# Patient Record
Sex: Male | Born: 1955 | Race: White | Hispanic: No | Marital: Married | Smoking: Former smoker
Health system: Southern US, Community
[De-identification: ages and names within clinical notes are randomized; demographics above are authoritative.]

## PROBLEM LIST (undated history)

## (undated) DIAGNOSIS — E119 Type 2 diabetes mellitus without complications: Secondary | ICD-10-CM

## (undated) DIAGNOSIS — I739 Peripheral vascular disease, unspecified: Secondary | ICD-10-CM

## (undated) DIAGNOSIS — I4891 Unspecified atrial fibrillation: Secondary | ICD-10-CM

## (undated) DIAGNOSIS — I4729 Other ventricular tachycardia: Secondary | ICD-10-CM

## (undated) DIAGNOSIS — I255 Ischemic cardiomyopathy: Secondary | ICD-10-CM

## (undated) DIAGNOSIS — I10 Essential (primary) hypertension: Secondary | ICD-10-CM

## (undated) DIAGNOSIS — K859 Acute pancreatitis without necrosis or infection, unspecified: Secondary | ICD-10-CM

## (undated) DIAGNOSIS — I5022 Chronic systolic (congestive) heart failure: Secondary | ICD-10-CM

## (undated) DIAGNOSIS — N183 Chronic kidney disease, stage 3 unspecified: Secondary | ICD-10-CM

## (undated) DIAGNOSIS — I429 Cardiomyopathy, unspecified: Secondary | ICD-10-CM

## (undated) DIAGNOSIS — E785 Hyperlipidemia, unspecified: Secondary | ICD-10-CM

## (undated) DIAGNOSIS — I2510 Atherosclerotic heart disease of native coronary artery without angina pectoris: Secondary | ICD-10-CM

## (undated) DIAGNOSIS — I472 Ventricular tachycardia: Secondary | ICD-10-CM

## (undated) HISTORY — DX: Hyperlipidemia, unspecified: E78.5

## (undated) HISTORY — PX: CHOLECYSTECTOMY: SHX55

## (undated) HISTORY — DX: Atherosclerotic heart disease of native coronary artery without angina pectoris: I25.10

## (undated) HISTORY — PX: ICD IMPLANT: EP1208

## (undated) HISTORY — DX: Acute pancreatitis without necrosis or infection, unspecified: K85.90

## (undated) HISTORY — DX: Essential (primary) hypertension: I10

## (undated) HISTORY — DX: Type 2 diabetes mellitus without complications: E11.9

---
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
## 2006-07-09 ENCOUNTER — Emergency Department (HOSPITAL_COMMUNITY): Admission: EM | Admit: 2006-07-09 | Discharge: 2006-07-09 | Payer: Self-pay | Admitting: Emergency Medicine

## 2006-07-10 ENCOUNTER — Emergency Department (HOSPITAL_COMMUNITY): Admission: EM | Admit: 2006-07-10 | Discharge: 2006-07-10 | Payer: Self-pay | Admitting: Family Medicine

## 2006-07-11 ENCOUNTER — Emergency Department (HOSPITAL_COMMUNITY): Admission: EM | Admit: 2006-07-11 | Discharge: 2006-07-11 | Payer: Self-pay | Admitting: Emergency Medicine

## 2019-04-03 DIAGNOSIS — L309 Dermatitis, unspecified: Secondary | ICD-10-CM | POA: Diagnosis not present

## 2019-04-03 DIAGNOSIS — Z23 Encounter for immunization: Secondary | ICD-10-CM | POA: Diagnosis not present

## 2019-04-03 DIAGNOSIS — K5790 Diverticulosis of intestine, part unspecified, without perforation or abscess without bleeding: Secondary | ICD-10-CM | POA: Diagnosis not present

## 2019-04-03 DIAGNOSIS — I10 Essential (primary) hypertension: Secondary | ICD-10-CM | POA: Diagnosis not present

## 2019-04-03 DIAGNOSIS — M109 Gout, unspecified: Secondary | ICD-10-CM | POA: Diagnosis not present

## 2019-04-03 DIAGNOSIS — Z0000 Encounter for general adult medical examination without abnormal findings: Secondary | ICD-10-CM | POA: Diagnosis not present

## 2019-05-17 DIAGNOSIS — I255 Ischemic cardiomyopathy: Secondary | ICD-10-CM | POA: Diagnosis not present

## 2019-06-05 ENCOUNTER — Other Ambulatory Visit: Payer: Self-pay

## 2019-06-05 NOTE — Patient Outreach (Addendum)
  [facility]) Care Management Chronic Special Needs Program    06/05/2019  Name: Richard Flynn, DOB: 08/16/1955  MRN: [id number]   [name] is enrolled in a chronic special needs plan for Diabetes. Telephone call to client to complete initial assessment. Unable to reach. HIPAA compliant voice message left with call back phone number.   PLAN; RNCM will attempt 2nd telephone call to client within 2 weeks.   Quinn Plowman RN,BSN,CCM [facility] [phone]

## 2019-06-06 ENCOUNTER — Ambulatory Visit: Payer: Self-pay

## 2019-06-08 ENCOUNTER — Other Ambulatory Visit: Payer: Self-pay

## 2019-06-08 NOTE — Patient Outreach (Signed)
  [facility]) Care Management Chronic Special Needs Program    06/08/2019  Name: Richard Flynn, DOB: 1956-06-08  MRN: [id number]   [name] is enrolled in a chronic special needs plan for Diabetes Second telephone call to client to complete initial assessment. Unable to reach. HIPAA compliant voice message left with call back phone number.   PLAN; RNCM will attempt 3rd telephone call to client within 1 month.    Quinn Plowman RN,BSN,CCM [facility] [phone]

## 2019-06-13 NOTE — Telephone Encounter (Signed)
This encounter was created in error - please disregard.

## 2019-06-14 ENCOUNTER — Other Ambulatory Visit: Payer: Self-pay

## 2019-06-14 NOTE — Patient Outreach (Signed)
  [facility]) Care Management Chronic Special Needs Program  06/14/2019  Name: Richard Flynn DOB: Nov 23, 1955  MRN: [id number]  Mr. Hassel Tetterton is enrolled in a chronic special needs plan for Diabetes. Client has not responded to three outreach attempts.  The client's individualized care plan was developed based upon the completed health risk assessment. Plan:   . Send unsuccessful outreach letter with a copy of individualized care plan to client . Send individualized care plan to provider  Chronic care management coordinator will attempt outreach in 9 Months.   Quinn Plowman RN,BSN,CCM [facility] [phone]

## 2019-06-16 ENCOUNTER — Ambulatory Visit: Payer: Self-pay

## 2019-08-14 DIAGNOSIS — E119 Type 2 diabetes mellitus without complications: Secondary | ICD-10-CM | POA: Diagnosis not present

## 2019-08-14 DIAGNOSIS — D649 Anemia, unspecified: Secondary | ICD-10-CM | POA: Diagnosis not present

## 2019-08-14 DIAGNOSIS — E785 Hyperlipidemia, unspecified: Secondary | ICD-10-CM | POA: Diagnosis not present

## 2019-08-14 DIAGNOSIS — M109 Gout, unspecified: Secondary | ICD-10-CM | POA: Diagnosis not present

## 2019-08-14 DIAGNOSIS — I10 Essential (primary) hypertension: Secondary | ICD-10-CM | POA: Diagnosis not present

## 2019-09-22 DIAGNOSIS — I2510 Atherosclerotic heart disease of native coronary artery without angina pectoris: Secondary | ICD-10-CM | POA: Diagnosis not present

## 2019-09-22 DIAGNOSIS — I429 Cardiomyopathy, unspecified: Secondary | ICD-10-CM | POA: Diagnosis not present

## 2019-09-22 DIAGNOSIS — I509 Heart failure, unspecified: Secondary | ICD-10-CM | POA: Diagnosis not present

## 2019-09-22 DIAGNOSIS — I255 Ischemic cardiomyopathy: Secondary | ICD-10-CM | POA: Diagnosis not present

## 2019-10-11 DIAGNOSIS — I739 Peripheral vascular disease, unspecified: Secondary | ICD-10-CM | POA: Diagnosis not present

## 2019-10-26 DIAGNOSIS — I739 Peripheral vascular disease, unspecified: Secondary | ICD-10-CM | POA: Diagnosis not present

## 2019-12-26 DIAGNOSIS — E1169 Type 2 diabetes mellitus with other specified complication: Secondary | ICD-10-CM | POA: Diagnosis not present

## 2019-12-26 DIAGNOSIS — I10 Essential (primary) hypertension: Secondary | ICD-10-CM | POA: Diagnosis not present

## 2019-12-26 DIAGNOSIS — E785 Hyperlipidemia, unspecified: Secondary | ICD-10-CM | POA: Diagnosis not present

## 2019-12-26 DIAGNOSIS — M109 Gout, unspecified: Secondary | ICD-10-CM | POA: Diagnosis not present

## 2019-12-27 DIAGNOSIS — I255 Ischemic cardiomyopathy: Secondary | ICD-10-CM | POA: Diagnosis not present

## 2020-03-19 NOTE — Telephone Encounter (Signed)
This encounter was created in error - please disregard.

## 2020-03-27 DIAGNOSIS — I255 Ischemic cardiomyopathy: Secondary | ICD-10-CM | POA: Diagnosis not present

## 2020-04-15 NOTE — Progress Notes (Signed)
Referring-Richard Marveen Reeks MD Reason for referral-coronary artery disease  HPI: 64 year old male for evaluation of coronary artery disease at request of Idelle Leech MD.  Previously followed by Tomie China, MD of [facility].  Patient found to have a cardiomyopathy November 2013.  Cardiac catheterization at that time revealed an occluded right coronary artery with left to right and right to right collaterals.  Moderate disease in the distal circumflex.  Most recent echocardiogram 2017 showed ejection fraction 35 to 40%.  Patient has had previous ICD.  Patient also with peripheral vascular disease.  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  ABIs April 2021.73 on the right and 0.73 on the left.  Patient denies dyspnea, chest pain, palpitations or syncope.  Some pain in calves with prolonged ambulation.  Current Outpatient Medications  Medication Sig Dispense Refill   allopurinol (ZYLOPRIM) 100 MG tablet Take 100 mg by mouth daily.     aspirin EC 81 MG tablet Take 81 mg by mouth daily. Swallow whole.     atorvastatin (LIPITOR) 40 MG tablet Take 40 mg by mouth daily.     carvedilol (COREG) 6.25 MG tablet Take 6.25 mg by mouth 2 (two) times daily with a meal.     Cholecalciferol (VITAMIN D3) 125 MCG (5000 UT) CAPS Take 1 capsule by mouth daily.     glipiZIDE (GLUCOTROL) 5 MG tablet Take 5 mg by mouth 2 (two) times daily before a meal.     lisinopril (ZESTRIL) 40 MG tablet Take 40 mg by mouth daily.     metFORMIN (GLUCOPHAGE-XR) 500 MG 24 hr tablet Take 500 mg by mouth 2 (two) times daily.     Omega-3 Fatty Acids (FISH OIL PO) Take 1 capsule by mouth daily.     spironolactone (ALDACTONE) 25 MG tablet Take 25 mg by mouth daily.     No current facility-administered medications for this visit.    No Known Allergies   Past Medical History:  Diagnosis Date   CAD (coronary artery disease)    Diabetes mellitus without complication ([place])    Hyperlipidemia     Hypertension    Ischemic cardiomyopathy    Pancreatitis     Past Surgical History:  Procedure Laterality Date   CHOLECYSTECTOMY     ICD IMPLANT      Social History   Socioeconomic History   Marital status: Married    Spouse name: Not on file   Number of children: 0   Years of education: Not on file   Highest education level: Not on file  Occupational History   Not on file  Tobacco Use   Smoking status: Current Every Day Smoker    Last attempt to quit: 2012    Years since quitting: 9.8   Smokeless tobacco: Never Used   Tobacco comment: vape  Substance and Sexual Activity   Alcohol use: Yes    Comment: Rare   Drug use: Not Currently   Sexual activity: Yes  Other Topics Concern   Not on file  Social History Narrative   Not on file   Social Determinants of Health   Financial Resource Strain:    Difficulty of Paying Living Expenses: Not on file  Food Insecurity: No Food Insecurity   Worried About [place] in the Last Year: Never true   [place] in the Last Year: Never true  Transportation Needs: No Transportation Needs   Lack of Transportation (Medical): No   Lack of  Transportation (Non-Medical): No  Physical Activity:    Days of Exercise per Week: Not on file   Minutes of Exercise per Session: Not on file  Stress:    Feeling of Stress : Not on file  Social Connections:    Frequency of Communication with Friends and Family: Not on file   Frequency of Social Gatherings with Friends and Family: Not on file   Attends Religious Services: Not on file   Active Member of [place] or Organizations: Not on file   Attends Archivist Meetings: Not on file   Marital Status: Not on file  Intimate Partner Violence:    Fear of Current or Ex-Partner: Not on file   Emotionally Abused: Not on file   Physically Abused: Not on file   Sexually Abused: Not on file    Family History  Problem Relation Age of Onset    CAD Mother     ROS: no fevers or chills, productive cough, hemoptysis, dysphasia, odynophagia, melena, hematochezia, dysuria, hematuria, rash, seizure activity, orthopnea, PND, pedal edema. Remaining systems are negative.  Physical Exam:   Blood pressure 124/78, pulse 65, height 5\' 6"  (1.676 m), weight 175 lb 12.8 oz (79.7 kg).  General:  Well developed/well nourished in NAD Skin warm/dry Patient not depressed No peripheral clubbing Back-normal HEENT-normal/normal eyelids Neck supple/normal carotid upstroke bilaterally; no bruits; no JVD; no thyromegaly chest - CTA/ normal expansion CV - RRR/normal S1 and S2; no murmurs, rubs or gallops;  PMI nondisplaced Abdomen -NT/ND, no HSM, no mass, + bowel sounds, no bruit 1+ femoral pulses, no bruits Ext-no edema, chords; diminished distal pulses Neuro-grossly nonfocal  ECG -sinus rhythm at a rate of 65, septal infarct, inferolateral T wave inversion.  Personally reviewed  A/P  1 coronary artery disease-patient denies chest pain.  Plan to continue medical therapy with aspirin and statin.  2 ischemic cardiomyopathy-continue carvedilol.  We will continue lisinopril for now.  We discussed Entresto today.  He will research expense and if agreeable he will contact us and we will discontinue lisinopril and then begin Entresto 48 hours later.  3 prior ICD-we will ask Dr. Curt Bears to follow in [facility].  4 hypertension-blood pressure controlled.  Medication adjustments as outlined for cardiomyopathy.  5 hyperlipidemia-continue statin.  6 chronic systolic congestive heart failure-patient appears to be euvolemic.  Continue Lasix and spironolactone at present dose.  7 peripheral vascular disease-claudication not lifestyle altering.  Continue medical therapy.  We will arrange ultrasound to exclude abdominal aortic aneurysm.  Kirk Ruths, MD

## 2020-04-18 ENCOUNTER — Ambulatory Visit (INDEPENDENT_AMBULATORY_CARE_PROVIDER_SITE_OTHER): Payer: HMO | Admitting: Cardiology

## 2020-04-18 ENCOUNTER — Encounter: Payer: Self-pay | Admitting: Cardiology

## 2020-04-18 ENCOUNTER — Other Ambulatory Visit: Payer: Self-pay

## 2020-04-18 VITALS — BP 124/78 | HR 65 | Ht 66.0 in | Wt 175.8 lb

## 2020-04-18 DIAGNOSIS — I10 Essential (primary) hypertension: Secondary | ICD-10-CM

## 2020-04-18 DIAGNOSIS — I255 Ischemic cardiomyopathy: Secondary | ICD-10-CM

## 2020-04-18 DIAGNOSIS — I2510 Atherosclerotic heart disease of native coronary artery without angina pectoris: Secondary | ICD-10-CM | POA: Diagnosis not present

## 2020-04-18 DIAGNOSIS — E7800 Pure hypercholesterolemia, unspecified: Secondary | ICD-10-CM | POA: Diagnosis not present

## 2020-04-18 DIAGNOSIS — R0989 Other specified symptoms and signs involving the circulatory and respiratory systems: Secondary | ICD-10-CM

## 2020-04-18 NOTE — Patient Instructions (Signed)
Medication Instructions:   ENTRESTO  *If you need a refill on your cardiac medications before your next appointment, please call your pharmacy*  Testing/Procedures:  Your physician has requested that you have an abdominal aorta duplex. During this test, an ultrasound is used to evaluate the aorta. Allow 30 minutes for this exam. Do not eat after midnight the day before and avoid carbonated beverages IN THE [place] OFFICE-[address]   Follow-Up: At [facility], you and your health needs are our priority.  As part of our continuing mission to provide you with exceptional heart care, we have created designated Provider Care Teams.  These Care Teams include your primary Cardiologist (physician) and Advanced Practice Providers (APPs -  Physician Assistants and Nurse Practitioners) who all work together to provide you with the care you need, when you need it.  We recommend signing up for the patient portal called "MyChart".  Sign up information is provided on this After Visit Summary.  MyChart is used to connect with patients for Virtual Visits (Telemedicine).  Patients are able to view lab/test results, encounter notes, upcoming appointments, etc.  Non-urgent messages can be sent to your provider as well.   To learn more about what you can do with MyChart, go to [email or web address].    Your next appointment:   6 month(s)  The format for your next appointment:   In Person  Provider:   Kirk Ruths, MD- [place]   Other Instructions [place]

## 2020-04-22 DIAGNOSIS — E1169 Type 2 diabetes mellitus with other specified complication: Secondary | ICD-10-CM | POA: Diagnosis not present

## 2020-04-22 DIAGNOSIS — E785 Hyperlipidemia, unspecified: Secondary | ICD-10-CM | POA: Diagnosis not present

## 2020-04-22 DIAGNOSIS — Z0000 Encounter for general adult medical examination without abnormal findings: Secondary | ICD-10-CM | POA: Diagnosis not present

## 2020-04-22 DIAGNOSIS — M109 Gout, unspecified: Secondary | ICD-10-CM | POA: Diagnosis not present

## 2020-04-22 DIAGNOSIS — I10 Essential (primary) hypertension: Secondary | ICD-10-CM | POA: Diagnosis not present

## 2020-04-23 DIAGNOSIS — E1169 Type 2 diabetes mellitus with other specified complication: Secondary | ICD-10-CM | POA: Diagnosis not present

## 2020-04-23 DIAGNOSIS — D649 Anemia, unspecified: Secondary | ICD-10-CM | POA: Diagnosis not present

## 2020-04-23 DIAGNOSIS — I10 Essential (primary) hypertension: Secondary | ICD-10-CM | POA: Diagnosis not present

## 2020-04-23 DIAGNOSIS — E785 Hyperlipidemia, unspecified: Secondary | ICD-10-CM | POA: Diagnosis not present

## 2020-04-23 DIAGNOSIS — Z125 Encounter for screening for malignant neoplasm of prostate: Secondary | ICD-10-CM | POA: Diagnosis not present

## 2020-04-23 DIAGNOSIS — R5383 Other fatigue: Secondary | ICD-10-CM | POA: Diagnosis not present

## 2020-04-23 DIAGNOSIS — M109 Gout, unspecified: Secondary | ICD-10-CM | POA: Diagnosis not present

## 2020-04-30 ENCOUNTER — Telehealth: Payer: Self-pay | Admitting: *Deleted

## 2020-04-30 DIAGNOSIS — E7800 Pure hypercholesterolemia, unspecified: Secondary | ICD-10-CM

## 2020-04-30 MED ORDER — ATORVASTATIN CALCIUM 80 MG PO TABS: 80.0000 mg | ORAL_TABLET | Freq: Every day | ORAL | 3 refills | Status: DC

## 2020-04-30 NOTE — Telephone Encounter (Signed)
Spoke with pt, recent labs from PCP reviewed by dr Stanford Breed. LDL of 81 is higher than the goal of <70 for that number. Patient instructed to increase atorvastatin to 80 mg once daily. He will have lab work repeated in 3 months. New script sent to the pharmacy and Lab orders mailed to the pt

## 2020-05-06 ENCOUNTER — Ambulatory Visit (HOSPITAL_BASED_OUTPATIENT_CLINIC_OR_DEPARTMENT_OTHER): Payer: HMO

## 2020-05-08 ENCOUNTER — Other Ambulatory Visit: Payer: Self-pay

## 2020-05-08 ENCOUNTER — Ambulatory Visit (HOSPITAL_BASED_OUTPATIENT_CLINIC_OR_DEPARTMENT_OTHER)
Admission: RE | Admit: 2020-05-08 | Discharge: 2020-05-08 | Disposition: A | Discharge status: Home / Self Care | Payer: HMO | Source: Ambulatory Visit | Attending: Cardiology | Admitting: Cardiology

## 2020-05-08 DIAGNOSIS — Z87891 Personal history of nicotine dependence: Secondary | ICD-10-CM | POA: Insufficient documentation

## 2020-05-08 DIAGNOSIS — R0989 Other specified symptoms and signs involving the circulatory and respiratory systems: Secondary | ICD-10-CM | POA: Diagnosis not present

## 2020-05-09 ENCOUNTER — Ambulatory Visit: Payer: Self-pay

## 2020-05-09 ENCOUNTER — Other Ambulatory Visit: Payer: Self-pay

## 2020-05-09 NOTE — Patient Outreach (Signed)
   [facility]) Care Management Chronic Special Needs Program    05/09/2020  Name: Richard Flynn, DOB: Nov 08, 1955  MRN: [id number]   [name] is enrolled in a chronic special needs plan for Diabetes. Telephone call to client for CSNP assessment and health risk assessment completion. Unable to reach. HIPAA compliant voice message left with call back phone number.   PLAN; RNCM will attempt 2nd telephone call to client in 3 weeks.   Richard Plowman RN,BSN,CCM [facility] [phone]

## 2020-05-16 ENCOUNTER — Ambulatory Visit (INDEPENDENT_AMBULATORY_CARE_PROVIDER_SITE_OTHER): Payer: HMO | Admitting: Cardiology

## 2020-05-16 ENCOUNTER — Other Ambulatory Visit: Payer: Self-pay

## 2020-05-16 ENCOUNTER — Encounter: Payer: Self-pay | Admitting: Cardiology

## 2020-05-16 VITALS — BP 102/66 | HR 67 | Ht 66.0 in | Wt 179.0 lb

## 2020-05-16 DIAGNOSIS — I255 Ischemic cardiomyopathy: Secondary | ICD-10-CM | POA: Diagnosis not present

## 2020-05-16 LAB — CUP PACEART INCLINIC DEVICE CHECK
Brady Statistic RV Percent Paced: 1 % — CL
Date Time Interrogation Session: 20211118145006
HighPow Impedance: 42 Ohm
HighPow Impedance: 54 Ohm
Implantable Lead Implant Date: 20140523
Implantable Lead Location: 753860
Implantable Lead Model: 296
Implantable Lead Serial Number: 305696
Implantable Pulse Generator Implant Date: 20140523
Lead Channel Impedance Value: 482 Ohm
Lead Channel Pacing Threshold Amplitude: 0.5 V
Lead Channel Pacing Threshold Pulse Width: 0.4 ms
Lead Channel Sensing Intrinsic Amplitude: 25 mV
Lead Channel Setting Pacing Amplitude: 2 V
Lead Channel Setting Pacing Pulse Width: 0.4 ms
Lead Channel Setting Sensing Sensitivity: 0.6 mV
Pulse Gen Serial Number: 100436

## 2020-05-16 NOTE — Patient Instructions (Addendum)
Medication Instructions:  Your physician recommends that you continue on your current medications as directed. Please refer to the Current Medication list given to you today.  *If you need a refill on your cardiac medications before your next appointment, please call your pharmacy*   Lab Work: None ordered   Testing/Procedures: None ordered   Follow-Up: At [facility], you and your health needs are our priority.  As part of our continuing mission to provide you with exceptional heart care, we have created designated Provider Care Teams.  These Care Teams include your primary Cardiologist (physician) and Advanced Practice Providers (APPs -  Physician Assistants and Nurse Practitioners) who all work together to provide you with the care you need, when you need it.  Remote monitoring is used to monitor your Pacemaker or ICD from home. This monitoring reduces the number of office visits required to check your device to one time per year. It allows [place] to keep an eye on the functioning of your device to ensure it is working properly. You are scheduled for a device check from home on 08/15/20. You may send your transmission at any time that day. If you have a wireless device, the transmission will be sent automatically. After your physician reviews your transmission, you will receive a postcard with your next transmission date.  Your next appointment:   1 year(s)  The format for your next appointment:   In Person  Provider:   Allegra Lai, MD   Thank you for choosing [facility]!!   Trinidad Curet, RN [phone]    Other Instructions

## 2020-05-16 NOTE — Progress Notes (Signed)
Electrophysiology Office Note   Date:  05/16/2020   ID:  Richard Flynn, DOB [birth year], MRN [id number]  PCP:  Finis Bud, MD  Cardiologist:  Stanford Breed Primary Electrophysiologist:  Fayth Trefry Meredith Leeds, MD    Chief Complaint: ICD   History of Present Illness: Richard Flynn is a 64 y.o. male who is being seen today for the evaluation of ICD at the request of Stanford Breed, Denice Bors, MD. Presenting today for electrophysiology evaluation.  He has a history of coronary artery disease, diabetes, hypertension, hyperlipidemia, and ischemic cardiomyopathy.  His cardiomyopathy was discovered November 2013.  He had a cardiac cath at that time revealed an occluded right coronary with left to right and right to right collaterals.  Most recent echo in 2017 shows an ejection fraction of 30 to 35%.  He has a [place].  He also have peripheral arterial disease.  Today, he denies symptoms of palpitations, chest pain, shortness of breath, orthopnea, PND, lower extremity edema, claudication, dizziness, presyncope, syncope, bleeding, or neurologic sequela. The patient is tolerating medications without difficulties.    Past Medical History:  Diagnosis Date  . CAD (coronary artery disease)   . Diabetes mellitus without complication ([place])   . Hyperlipidemia   . Hypertension   . Ischemic cardiomyopathy   . Pancreatitis    Past Surgical History:  Procedure Laterality Date  . CHOLECYSTECTOMY    . ICD IMPLANT       Current Outpatient Medications  Medication Sig Dispense Refill  . allopurinol (ZYLOPRIM) 100 MG tablet Take 100 mg by mouth daily.    Marland Kitchen aspirin EC 81 MG tablet Take 81 mg by mouth daily. Swallow whole.    Marland Kitchen atorvastatin (LIPITOR) 80 MG tablet Take 1 tablet (80 mg total) by mouth daily. 90 tablet 3  . carvedilol (COREG) 6.25 MG tablet Take 6.25 mg by mouth 2 (two) times daily with a meal.    . Cholecalciferol (VITAMIN D3) 125 MCG (5000 UT) CAPS Take 1 capsule by mouth  daily.    Marland Kitchen glipiZIDE (GLUCOTROL) 5 MG tablet Take 5 mg by mouth 2 (two) times daily before a meal.    . lisinopril (ZESTRIL) 40 MG tablet Take 40 mg by mouth daily.    . metFORMIN (GLUCOPHAGE-XR) 500 MG 24 hr tablet Take 500 mg by mouth 2 (two) times daily.    . Omega-3 Fatty Acids (FISH OIL PO) Take 1 capsule by mouth daily.    Marland Kitchen spironolactone (ALDACTONE) 25 MG tablet Take 25 mg by mouth daily.     No current facility-administered medications for this visit.    Allergies:   Patient has no known allergies.   Social History:  The patient  reports that he has been smoking. He has never used smokeless tobacco. He reports current alcohol use. He reports previous drug use.   Family History:  The patient's family history includes CAD in his mother.    ROS:  Please see the history of present illness.   Otherwise, review of systems is positive for none.   All other systems are reviewed and negative.    PHYSICAL EXAM: VS:  BP 102/66   Pulse 67   Ht 5\' 6"  (1.676 m)   Wt 179 lb (81.2 kg)   SpO2 99%   BMI 28.89 kg/m  , BMI Body mass index is 28.89 kg/m. GEN: Well nourished, well developed, in no acute distress  HEENT: normal  Neck: no JVD, carotid bruits, or masses Cardiac: RRR; no  murmurs, rubs, or gallops,no edema  Respiratory:  clear to auscultation bilaterally, normal work of breathing GI: soft, nontender, nondistended, + BS MS: no deformity or atrophy  Skin: warm and dry, device pocket is well healed Neuro:  Strength and sensation are intact Psych: euthymic mood, full affect  EKG:  EKG is not ordered today. Personal review of the ekg ordered 04/18/20 shows sinus rhythm, 65  Device interrogation is reviewed today in detail.  See PaceArt for details.   Recent Labs: No results found for requested labs within last 8760 hours.    Lipid Panel  No results found for: CHOL, TRIG, HDL, CHOLHDL, VLDL, LDLCALC, LDLDIRECT   Wt Readings from Last 3 Encounters:  05/16/20 179 lb  (81.2 kg)  04/18/20 175 lb 12.8 oz (79.7 kg)      Other studies Reviewed: Additional studies/ records that were reviewed today include: epic notes  ASSESSMENT AND PLAN:  1.  Coronary artery disease: Currently on aspirin and statin.  No current chest pain.  2.  Ischemic cardiomyopathy: Currently on lisinopril, carvedilol, Aldactone.  Is status post [employer] device functioning appropriately.  We Richard Flynn enroll in device clinic with remote checks.  3.  Hypertension: Currently well controlled.  No changes.    Current medicines are reviewed at length with the patient today.   The patient does not have concerns regarding his medicines.  The following changes were made today:  none  Labs/ tests ordered today include:  No orders of the defined types were placed in this encounter.    Disposition:   FU with Richard Flynn 1 year  Signed, Melda Mermelstein Meredith Leeds, MD  05/16/2020 2:09 PM     [place] [place] [address] [place] [address] [phone] (office) [phone] (fax)

## 2020-05-27 ENCOUNTER — Other Ambulatory Visit: Payer: Self-pay

## 2020-05-27 NOTE — Patient Outreach (Signed)
  [facility]) Care Management Chronic Special Needs Program    05/27/2020  Name: KEMUEL BUCHMANN, DOB: 10/29/1955  MRN: [id number]   Mr. Sanford Lindblad is enrolled in a chronic special needs plan for Diabetes. Second telephone call to client for CSNP assessment follow up. Unable to reach client. HIPAA compliant voice message left with call back phone number and return call request.   PLAN:  RNCM will attempt 3rd telephone call to client in 2 weeks.   Quinn Plowman RN,BSN,CCM [facility] [phone]

## 2020-06-04 ENCOUNTER — Other Ambulatory Visit: Payer: Self-pay

## 2020-06-04 NOTE — Patient Outreach (Signed)
[facility]) Care Management Chronic Special Needs Program  06/04/2020  Name: GABOR LUSK DOB: 06-13-1956  MRN: [id number]  Mr. Chief Walkup is enrolled in a chronic special needs plan for Diabetes. Telephone call to client for CSNP assessment update. Unable to reach. HIPAA compliant voice message left with call back phone number and return call request.  Individualized care plan completed based on available date.    Goals Addressed            This Visit's Progress   . Client understands the importance of follow-up with providers by attending scheduled visits   On track    Primary care provider visit 04/03/19 Cardiology visit 05/16/20 Continue to follow up with your doctors as recommended.      . Client verbalizes knowledge of Heart Attack self management skills within 9 months   On track    Unable to reach client to discuss. RN case manager will send client education article: Lowering your risk for heart disease Keep scheduled appointments with your provider Take your medications as prescribed.  Call 911 for heart attack symptoms: Chest pain / discomfort Feeling weak, light -headed or faint Pain or discomfort in the jaw, neck or back Pain or discomfort in one or both arms or shoulders Shortness of breath     . Client will report no worsening of symptoms of Atrial Fibrillation within the next 9 months       RN case manager will send client education article: Atrial fibrillation Review [facility] calendar sent in the mail for Atrial fibrillation information and action plan Call your provider if you have a racing or irregular heartbeat that may be uncomfortable Call 911 for severe symptoms such as chest pain, fainting, shortness of breath    . Client will verbalize knowledge of self management of Hypertension as evidences by BP reading of 140/90 or less; or as defined by provider   On track    RN case manager will send client education article: High  blood pressure in adults Take your medications as prescribed. Follow up regularly with your doctor. Plan to eat low salt and heart healthy meals full of fruits, vegetables, whole grains, lean protein and limit fat and sugars Plan to check your blood pressure regularly. If you do not have  a blood pressure monitor on can be provided to you.    Marland Kitchen HEMOGLOBIN A1C < 7       Discussed diabetes self management actions:  Glucose monitoring per provider recommendation  Check feet daily  Visit provider every 3-6 months as directed  Hbg A1C level every 3-6 months.  Eye Exam yearly  Carbohydrate controlled meal planning  Taking diabetes medication as prescribed by provider  Physical activity     . Maintain timely refills of diabetic medication as prescribed within the year .       Unable to contact client to discuss. Contact your RN case manager if you have difficulty obtaining your medications.  Take your medication as prescribed.    . Obtain annual  Lipid Profile, LDL-C       Per chart review your most recent Lipid profile was 11/28/18 The goal for LDL is less than 70 mg / dl as you are at high risk for complications try to avoid saturated fats, trans-fats and eat more fiber    . Obtain Annual Eye (retinal)  Exam        Unable to determine annual eye exam Diabetes can affect your  vision.  Plan to have a dilated eye exam every year.    . Obtain Annual Foot Exam       Unable to determine annual eye exam It is important that your doctor check your feet regularly.  Diabetes can affect the nerves in your feet causing decreased feeling or numbness.  Inform your doctor if you experience these symptoms.  Diabetes foot care - Check feet daily at home (look for skin color changes, cuts, sores or cracks in the skin, swelling of feet or ankles, ingrown or fungal toenails, corn or calluses). Report these findings to your doctor - Wash feet with soap and water, dry feet well especially between  toes - Moisturize your feet but not between the toes - Always wear shoes that protect your whole feet.      . Obtain annual screen for micro albuminuria (urine) , nephropathy (kidney problems)       RN case manager will send client education article: Urine albumin test Diabetes can affect your kidneys.      . Obtain Hemoglobin A1C at least 2 times per year       Hgb A1c completed on  11/28/2018 Have your Hgb A1c checked every 6 months if you are at goal or every 3 months if you are not at goal.  Plan to eat low carbohydrate and low salt meals, Watch portion sizes and avoid sugar sweetened drinks Exercise if you are able to.  Follow your doctor recommendations.    . Visit Primary Care Provider or Endocrinologist at least 2 times per year        Primary care provider visit 04/03/19 Cardiology visit 05/16/20 Continue to follow up with your doctors as recommended.        Plan:  Send successful outreach letter with a copy of their individualized care plan, Send individual care plan to provider and Send educational material  Chronic care management coordinator will outreach in:12 months [facility] will continue to provide services for this member through 06/28/20.  The [facility] care management team will assume care 06/29/2020.     Quinn Plowman RN,BSN,CCM Chronic Care Management Coordinator [place] [facility] [phone]    .

## 2020-06-11 ENCOUNTER — Other Ambulatory Visit: Payer: Self-pay

## 2020-06-11 NOTE — Patient Outreach (Signed)
  [facility]) Care Management Chronic Special Needs Program    06/11/2020  Name: Richard Flynn, DOB: [birth year]  MRN: [id number]  [facility] will continue to provide services for this member through 06/28/20.  The [facility] care management team will assume care 06/29/2020.   Quinn Plowman RN,BSN,CCM [facility] Management [phone]

## 2020-06-14 ENCOUNTER — Encounter (HOSPITAL_COMMUNITY): Payer: Self-pay

## 2020-06-14 ENCOUNTER — Other Ambulatory Visit: Payer: Self-pay

## 2020-06-14 ENCOUNTER — Emergency Department (HOSPITAL_COMMUNITY): Payer: HMO

## 2020-06-14 ENCOUNTER — Inpatient Hospital Stay (HOSPITAL_COMMUNITY)
Admission: EM | Admit: 2020-06-14 | Discharge: 2020-06-18 | DRG: 286 | Disposition: A | Discharge status: Home / Self Care | Payer: HMO | Attending: Internal Medicine | Admitting: Internal Medicine

## 2020-06-14 ENCOUNTER — Telehealth: Payer: Self-pay | Admitting: Cardiology

## 2020-06-14 DIAGNOSIS — E785 Hyperlipidemia, unspecified: Secondary | ICD-10-CM | POA: Diagnosis present

## 2020-06-14 DIAGNOSIS — E875 Hyperkalemia: Secondary | ICD-10-CM | POA: Diagnosis present

## 2020-06-14 DIAGNOSIS — J189 Pneumonia, unspecified organism: Secondary | ICD-10-CM

## 2020-06-14 DIAGNOSIS — Z79899 Other long term (current) drug therapy: Secondary | ICD-10-CM

## 2020-06-14 DIAGNOSIS — J181 Lobar pneumonia, unspecified organism: Secondary | ICD-10-CM | POA: Diagnosis not present

## 2020-06-14 DIAGNOSIS — J90 Pleural effusion, not elsewhere classified: Secondary | ICD-10-CM | POA: Diagnosis not present

## 2020-06-14 DIAGNOSIS — I130 Hypertensive heart and chronic kidney disease with heart failure and stage 1 through stage 4 chronic kidney disease, or unspecified chronic kidney disease: Principal | ICD-10-CM | POA: Diagnosis present

## 2020-06-14 DIAGNOSIS — Z23 Encounter for immunization: Secondary | ICD-10-CM | POA: Diagnosis not present

## 2020-06-14 DIAGNOSIS — R0602 Shortness of breath: Secondary | ICD-10-CM | POA: Diagnosis not present

## 2020-06-14 DIAGNOSIS — I252 Old myocardial infarction: Secondary | ICD-10-CM | POA: Diagnosis not present

## 2020-06-14 DIAGNOSIS — Z794 Long term (current) use of insulin: Secondary | ICD-10-CM

## 2020-06-14 DIAGNOSIS — F17290 Nicotine dependence, other tobacco product, uncomplicated: Secondary | ICD-10-CM | POA: Diagnosis not present

## 2020-06-14 DIAGNOSIS — I428 Other cardiomyopathies: Secondary | ICD-10-CM | POA: Diagnosis present

## 2020-06-14 DIAGNOSIS — E1122 Type 2 diabetes mellitus with diabetic chronic kidney disease: Secondary | ICD-10-CM | POA: Diagnosis not present

## 2020-06-14 DIAGNOSIS — R911 Solitary pulmonary nodule: Secondary | ICD-10-CM | POA: Diagnosis not present

## 2020-06-14 DIAGNOSIS — I7409 Other arterial embolism and thrombosis of abdominal aorta: Secondary | ICD-10-CM | POA: Diagnosis present

## 2020-06-14 DIAGNOSIS — I2699 Other pulmonary embolism without acute cor pulmonale: Secondary | ICD-10-CM | POA: Diagnosis not present

## 2020-06-14 DIAGNOSIS — Z95810 Presence of automatic (implantable) cardiac defibrillator: Secondary | ICD-10-CM

## 2020-06-14 DIAGNOSIS — I25110 Atherosclerotic heart disease of native coronary artery with unstable angina pectoris: Secondary | ICD-10-CM | POA: Diagnosis not present

## 2020-06-14 DIAGNOSIS — R0600 Dyspnea, unspecified: Secondary | ICD-10-CM | POA: Diagnosis present

## 2020-06-14 DIAGNOSIS — R079 Chest pain, unspecified: Secondary | ICD-10-CM

## 2020-06-14 DIAGNOSIS — N179 Acute kidney failure, unspecified: Secondary | ICD-10-CM | POA: Diagnosis present

## 2020-06-14 DIAGNOSIS — Z7982 Long term (current) use of aspirin: Secondary | ICD-10-CM

## 2020-06-14 DIAGNOSIS — D6489 Other specified anemias: Secondary | ICD-10-CM | POA: Diagnosis not present

## 2020-06-14 DIAGNOSIS — Z20822 Contact with and (suspected) exposure to covid-19: Secondary | ICD-10-CM | POA: Diagnosis present

## 2020-06-14 DIAGNOSIS — I2510 Atherosclerotic heart disease of native coronary artery without angina pectoris: Secondary | ICD-10-CM | POA: Diagnosis not present

## 2020-06-14 DIAGNOSIS — I255 Ischemic cardiomyopathy: Secondary | ICD-10-CM | POA: Diagnosis present

## 2020-06-14 DIAGNOSIS — E1151 Type 2 diabetes mellitus with diabetic peripheral angiopathy without gangrene: Secondary | ICD-10-CM | POA: Diagnosis not present

## 2020-06-14 DIAGNOSIS — Z7984 Long term (current) use of oral hypoglycemic drugs: Secondary | ICD-10-CM | POA: Diagnosis not present

## 2020-06-14 DIAGNOSIS — N1831 Chronic kidney disease, stage 3a: Secondary | ICD-10-CM | POA: Diagnosis not present

## 2020-06-14 DIAGNOSIS — I5023 Acute on chronic systolic (congestive) heart failure: Secondary | ICD-10-CM

## 2020-06-14 DIAGNOSIS — I340 Nonrheumatic mitral (valve) insufficiency: Secondary | ICD-10-CM | POA: Diagnosis not present

## 2020-06-14 DIAGNOSIS — R0789 Other chest pain: Secondary | ICD-10-CM | POA: Diagnosis not present

## 2020-06-14 HISTORY — DX: Cardiomyopathy, unspecified: I42.9

## 2020-06-14 HISTORY — DX: Peripheral vascular disease, unspecified: I73.9

## 2020-06-14 HISTORY — DX: Chronic kidney disease, stage 3 unspecified: N18.30

## 2020-06-14 HISTORY — DX: Other ventricular tachycardia: I47.29

## 2020-06-14 HISTORY — DX: Ventricular tachycardia: I47.2

## 2020-06-14 HISTORY — DX: Chronic systolic (congestive) heart failure: I50.22

## 2020-06-14 LAB — CBC WITH DIFFERENTIAL/PLATELET
Abs Immature Granulocytes: 0.06 10*3/uL (ref 0.00–0.07)
Basophils Absolute: 0.1 10*3/uL (ref 0.0–0.1)
Basophils Relative: 1 %
Eosinophils Absolute: 0.1 10*3/uL (ref 0.0–0.5)
Eosinophils Relative: 1 %
HCT: 34.4 % — ABNORMAL LOW (ref 39.0–52.0)
Hemoglobin: 10.9 g/dL — ABNORMAL LOW (ref 13.0–17.0)
Immature Granulocytes: 1 %
Lymphocytes Relative: 8 %
Lymphs Abs: 1 10*3/uL (ref 0.7–4.0)
MCH: 30.1 pg (ref 26.0–34.0)
MCHC: 31.7 g/dL (ref 30.0–36.0)
MCV: 95 fL (ref 80.0–100.0)
Monocytes Absolute: 0.7 10*3/uL (ref 0.1–1.0)
Monocytes Relative: 5 %
Neutro Abs: 11.2 10*3/uL — ABNORMAL HIGH (ref 1.7–7.7)
Neutrophils Relative %: 84 %
Platelets: 177 10*3/uL (ref 150–400)
RBC: 3.62 MIL/uL — ABNORMAL LOW (ref 4.22–5.81)
RDW: 14.7 % (ref 11.5–15.5)
WBC: 13.1 10*3/uL — ABNORMAL HIGH (ref 4.0–10.5)
nRBC: 0 % (ref 0.0–0.2)

## 2020-06-14 LAB — COMPREHENSIVE METABOLIC PANEL
ALT: 37 U/L (ref 0–44)
AST: 32 U/L (ref 15–41)
Albumin: 3.7 g/dL (ref 3.5–5.0)
Alkaline Phosphatase: 73 U/L (ref 38–126)
Anion gap: 11 (ref 5–15)
BUN: 22 mg/dL (ref 8–23)
CO2: 18 mmol/L — ABNORMAL LOW (ref 22–32)
Calcium: 8.3 mg/dL — ABNORMAL LOW (ref 8.9–10.3)
Chloride: 106 mmol/L (ref 98–111)
Creatinine, Ser: 1.39 mg/dL — ABNORMAL HIGH (ref 0.61–1.24)
GFR, Estimated: 56 mL/min — ABNORMAL LOW (ref >60)
Glucose, Bld: 187 mg/dL — ABNORMAL HIGH (ref 70–99)
Potassium: 5.3 mmol/L — ABNORMAL HIGH (ref 3.5–5.1)
Sodium: 135 mmol/L (ref 135–145)
Total Bilirubin: 1.4 mg/dL — ABNORMAL HIGH (ref 0.3–1.2)
Total Protein: 6.9 g/dL (ref 6.5–8.1)

## 2020-06-14 LAB — TROPONIN I (HIGH SENSITIVITY)
Troponin I (High Sensitivity): 27 ng/L — ABNORMAL HIGH (ref <18)
Troponin I (High Sensitivity): 28 ng/L — ABNORMAL HIGH (ref <18)

## 2020-06-14 LAB — RESP PANEL BY RT-PCR (FLU A&B, COVID) ARPGX2
Influenza A by PCR: NEGATIVE
Influenza B by PCR: NEGATIVE
SARS Coronavirus 2 by RT PCR: NEGATIVE

## 2020-06-14 LAB — POC OCCULT BLOOD, ED: Fecal Occult Bld: NEGATIVE

## 2020-06-14 LAB — BRAIN NATRIURETIC PEPTIDE: B Natriuretic Peptide: 603 pg/mL — ABNORMAL HIGH (ref 0.0–100.0)

## 2020-06-14 LAB — CBG MONITORING, ED: Glucose-Capillary: 119 mg/dL — ABNORMAL HIGH (ref 70–99)

## 2020-06-14 MED ORDER — OMEGA-3-ACID ETHYL ESTERS 1 G PO CAPS
1.0000 g | ORAL_CAPSULE | Freq: Every day | ORAL | Status: DC
  Administered 2020-06-15 – 2020-06-18 (×4): 1 g via ORAL
  Filled 2020-06-14 (×4): qty 1

## 2020-06-14 MED ORDER — ASPIRIN EC 81 MG PO TBEC
81.0000 mg | DELAYED_RELEASE_TABLET | Freq: Every day | ORAL | Status: DC
  Administered 2020-06-15 – 2020-06-18 (×3): 81 mg via ORAL
  Filled 2020-06-14 (×3): qty 1

## 2020-06-14 MED ORDER — ACETAMINOPHEN 650 MG RE SUPP: 650.0000 mg | Freq: Four times a day (QID) | RECTAL | Status: DC | PRN

## 2020-06-14 MED ORDER — AZITHROMYCIN 500 MG IV SOLR
500.0000 mg | Freq: Once | INTRAVENOUS | Status: AC
  Administered 2020-06-14: 500 mg via INTRAVENOUS
  Filled 2020-06-14: qty 500

## 2020-06-14 MED ORDER — INSULIN GLARGINE 100 UNIT/ML SOLOSTAR PEN: 24.0000 [IU] | PEN_INJECTOR | Freq: Every day | SUBCUTANEOUS | Status: DC

## 2020-06-14 MED ORDER — ENOXAPARIN SODIUM 40 MG/0.4ML ~~LOC~~ SOLN
40.0000 mg | SUBCUTANEOUS | Status: DC
  Administered 2020-06-14 – 2020-06-16 (×3): 40 mg via SUBCUTANEOUS
  Filled 2020-06-14 (×3): qty 0.4

## 2020-06-14 MED ORDER — LOSARTAN POTASSIUM 50 MG PO TABS
50.0000 mg | ORAL_TABLET | Freq: Every day | ORAL | Status: DC
  Administered 2020-06-15: 50 mg via ORAL
  Filled 2020-06-14: qty 1

## 2020-06-14 MED ORDER — ACETAMINOPHEN 325 MG PO TABS: 650.0000 mg | ORAL_TABLET | Freq: Four times a day (QID) | ORAL | Status: DC | PRN

## 2020-06-14 MED ORDER — CARVEDILOL 6.25 MG PO TABS
6.2500 mg | ORAL_TABLET | Freq: Two times a day (BID) | ORAL | Status: DC
  Administered 2020-06-14 – 2020-06-18 (×8): 6.25 mg via ORAL
  Filled 2020-06-14: qty 1
  Filled 2020-06-14: qty 2
  Filled 2020-06-14 (×6): qty 1

## 2020-06-14 MED ORDER — INSULIN ASPART 100 UNIT/ML ~~LOC~~ SOLN
0.0000 [IU] | Freq: Three times a day (TID) | SUBCUTANEOUS | Status: DC
  Administered 2020-06-15 – 2020-06-16 (×2): 5 [IU] via SUBCUTANEOUS
  Administered 2020-06-17 – 2020-06-18 (×2): 1 [IU] via SUBCUTANEOUS

## 2020-06-14 MED ORDER — COENZYME Q10 50 MG PO CHEW: 1.0000 | CHEWABLE_TABLET | Freq: Every day | ORAL | Status: DC

## 2020-06-14 MED ORDER — INSULIN GLARGINE 100 UNIT/ML ~~LOC~~ SOLN
24.0000 [IU] | Freq: Every day | SUBCUTANEOUS | Status: DC
  Administered 2020-06-14 – 2020-06-17 (×4): 24 [IU] via SUBCUTANEOUS
  Filled 2020-06-14 (×5): qty 0.24

## 2020-06-14 MED ORDER — ONDANSETRON HCL 4 MG/2ML IJ SOLN: 4.0000 mg | Freq: Four times a day (QID) | INTRAMUSCULAR | Status: DC | PRN

## 2020-06-14 MED ORDER — SODIUM CHLORIDE 0.9 % IV SOLN
1.0000 g | Freq: Once | INTRAVENOUS | Status: AC
  Administered 2020-06-14: 1 g via INTRAVENOUS
  Filled 2020-06-14: qty 10

## 2020-06-14 MED ORDER — ATORVASTATIN CALCIUM 80 MG PO TABS
80.0000 mg | ORAL_TABLET | Freq: Every day | ORAL | Status: DC
  Administered 2020-06-15 – 2020-06-18 (×4): 80 mg via ORAL
  Filled 2020-06-14 (×4): qty 1

## 2020-06-14 MED ORDER — ONDANSETRON HCL 4 MG PO TABS: 4.0000 mg | ORAL_TABLET | Freq: Four times a day (QID) | ORAL | Status: DC | PRN

## 2020-06-14 MED ORDER — ADULT MULTIVITAMIN W/MINERALS CH
1.0000 | ORAL_TABLET | Freq: Every day | ORAL | Status: DC
  Administered 2020-06-14 – 2020-06-17 (×4): 1 via ORAL
  Filled 2020-06-14 (×4): qty 1

## 2020-06-14 MED ORDER — FUROSEMIDE 10 MG/ML IJ SOLN
40.0000 mg | Freq: Two times a day (BID) | INTRAMUSCULAR | Status: DC
  Administered 2020-06-14 – 2020-06-15 (×2): 40 mg via INTRAVENOUS
  Filled 2020-06-14 (×2): qty 4

## 2020-06-14 MED ORDER — ALLOPURINOL 100 MG PO TABS
100.0000 mg | ORAL_TABLET | Freq: Every day | ORAL | Status: DC
  Administered 2020-06-15 – 2020-06-18 (×4): 100 mg via ORAL
  Filled 2020-06-14 (×4): qty 1

## 2020-06-14 MED ORDER — VITAMIN D 25 MCG (1000 UNIT) PO TABS
5000.0000 [IU] | ORAL_TABLET | Freq: Every day | ORAL | Status: DC
  Administered 2020-06-15 – 2020-06-18 (×4): 5000 [IU] via ORAL
  Filled 2020-06-14 (×4): qty 5

## 2020-06-14 NOTE — ED Provider Notes (Signed)
[facility] EMERGENCY DEPARTMENT Provider Note   CSN: [id number] Arrival date & time: 06/14/20  1532     History Chief Complaint  Patient presents with  . Chest Pain  . Shortness of Breath    Richard Flynn is a 64 y.o. male.  HPI   Pt is as 64 y/o male with a h/o CAD, DM, HLD, HTN, ischemic cardiomyopathy, pancreatitis, MIx2, who presents to the ED today for eval of shortness of breath that started a few weeks ago. States sxs intermittent and were worse today. sxs initially seemed to only occur with exertion but now he is short of breath even at rest. States that he has some associated chest pain when he gets short of breath. Chest pain located to the mid chest and is nonradiating. Describes pain as a heaviness that is mild. Rates it at 2-3/10 when he has it, but he currently has no pain. Pain is not pleuritic. Reports associated diaphoresis, but denies nausea. Denies recent leg swelling  Has had a cough for several weeks. This has been productive with clear/yellow sputum.  Denies leg pain/swelling, hemoptysis, recent surgery/trauma, hormone use, personal hx of cancer, or hx of DVT/PE.  Reports recent long car ride to and from [place], [place] (total of 12 hours each way). But he states sxs started prior to this trip.    Past Medical History:  Diagnosis Date  . CAD (coronary artery disease)    a. [facility] notes suggest cardiac cath 2013 showed occluded proximal RCA with left to right collaterals, 20-40% proximal LAD, 70% distal circumflex artery.  . Cardiomyopathy ([place])   . Chronic systolic CHF (congestive heart failure) ([place])   . CKD (chronic kidney disease), stage III ([place])   . Diabetes mellitus without complication ([place])   . Hyperlipidemia   . Hypertension   . NSVT (nonsustained ventricular tachycardia) ([place])    mentioned in device interrogation  . PAD (peripheral artery disease) ([place])     with aortoiliac occlusion (med rx per [facility])  .  Pancreatitis     There are no problems to display for this patient.   Past Surgical History:  Procedure Laterality Date  . CHOLECYSTECTOMY    . ICD IMPLANT         Family History  Problem Relation Age of Onset  . CAD Mother     Social History   Tobacco Use  . Smoking status: Current Every Day Smoker    Last attempt to quit: 2012    Years since quitting: 9.9  . Smokeless tobacco: Never Used  . Tobacco comment: vape  Substance Use Topics  . Alcohol use: Not Currently    Comment: Rare  . Drug use: Not Currently    Home Medications Prior to Admission medications   Medication Sig Start Date End Date Taking? Authorizing Provider  allopurinol (ZYLOPRIM) 100 MG tablet Take 100 mg by mouth daily.   Yes [provider]  aspirin EC 81 MG tablet Take 81 mg by mouth daily. Swallow whole.   Yes [provider]  atorvastatin (LIPITOR) 80 MG tablet Take 1 tablet (80 mg total) by mouth daily. 04/30/20  Yes Lelon Perla, MD  carvedilol (COREG) 6.25 MG tablet Take 6.25 mg by mouth 2 (two) times daily with a meal.   Yes [provider]  Cholecalciferol (VITAMIN D3) 125 MCG (5000 UT) CAPS Take 1 capsule by mouth daily.   Yes [provider]  Coenzyme Q10 (COQ10 GUMMIES ADULT)  50 MG CHEW Chew 1 tablet by mouth at bedtime.   Yes [provider]  glipiZIDE (GLUCOTROL) 5 MG tablet Take 5 mg by mouth 2 (two) times daily before a meal.   Yes [provider]  LANTUS SOLOSTAR 100 UNIT/ML Solostar Pen Inject 24 Units into the skin at bedtime. 06/13/20  Yes [provider]  lisinopril (ZESTRIL) 40 MG tablet Take 40 mg by mouth daily.   Yes [provider]  metFORMIN (GLUCOPHAGE-XR) 500 MG 24 hr tablet Take 500 mg by mouth 2 (two) times daily.   Yes [provider]  Misc Natural Products (AIRBORNE ELDERBERRY) CHEW Chew 1 tablet by mouth at bedtime.   Yes [provider]  Multiple Vitamins-Minerals  (MULTIVITAMIN ADULT) CHEW Chew 1 tablet by mouth at bedtime.   Yes [provider]  nitroGLYCERIN (NITROSTAT) 0.4 MG SL tablet Place under the tongue. 06/14/20  Yes [provider]  Omega-3 Fatty Acids (FISH OIL PO) Take 1 capsule by mouth daily.   Yes [provider]  spironolactone (ALDACTONE) 25 MG tablet Take 25 mg by mouth daily.   Yes [provider]    Allergies    Patient has no known allergies.  Review of Systems   Review of Systems  Constitutional: Positive for diaphoresis. Negative for chills and fever.  HENT: Negative for ear pain and sore throat.   Eyes: Negative for pain and visual disturbance.  Respiratory: Positive for cough and shortness of breath.   Cardiovascular: Positive for chest pain. Negative for leg swelling.  Gastrointestinal: Negative for abdominal pain, constipation, diarrhea, nausea and vomiting.  Genitourinary: Negative for dysuria and hematuria.  Musculoskeletal: Negative for back pain.  Skin: Negative for rash.  Neurological: Negative for seizures and syncope.  All other systems reviewed and are negative.   Physical Exam Updated Vital Signs BP (!) 136/91   Pulse 71   Temp 98 F (36.7 C) (Oral)   Resp 18   Ht 5\' 6"  (1.676 m)   Wt 79.4 kg   SpO2 92%   BMI 28.25 kg/m   Physical Exam Vitals and nursing note reviewed.  Constitutional:      Appearance: He is well-developed and well-nourished.  HENT:     Head: Normocephalic and atraumatic.  Eyes:     Conjunctiva/sclera: Conjunctivae normal.  Cardiovascular:     Rate and Rhythm: Normal rate and regular rhythm.     Heart sounds: No murmur heard.   Pulmonary:     Effort: Pulmonary effort is normal. No respiratory distress.     Breath sounds: Examination of the right-lower field reveals rales. Examination of the left-lower field reveals rales. Rales present. No decreased breath sounds, wheezing or rhonchi.  Abdominal:     Palpations: Abdomen is soft.      Tenderness: There is no abdominal tenderness. There is no guarding or rebound.  Musculoskeletal:        General: No edema.     Cervical back: Neck supple.     Right lower leg: No tenderness. No edema.     Left lower leg: No tenderness. No edema.  Skin:    General: Skin is warm and dry.  Neurological:     Mental Status: He is alert.  Psychiatric:        Mood and Affect: Mood and affect normal.     ED Results / Procedures / Treatments   Labs (all labs ordered are listed, but only abnormal results are displayed) Labs Reviewed  CBC WITH  DIFFERENTIAL/PLATELET - Abnormal; Notable for the following components:      Result Value   WBC 13.1 (*)    RBC 3.62 (*)    Hemoglobin 10.9 (*)    HCT 34.4 (*)    Neutro Abs 11.2 (*)    All other components within normal limits  COMPREHENSIVE METABOLIC PANEL - Abnormal; Notable for the following components:   Potassium 5.3 (*)    CO2 18 (*)    Glucose, Bld 187 (*)    Creatinine, Ser 1.39 (*)    Calcium 8.3 (*)    Total Bilirubin 1.4 (*)    GFR, Estimated 56 (*)    All other components within normal limits  BRAIN NATRIURETIC PEPTIDE - Abnormal; Notable for the following components:   B Natriuretic Peptide 603.0 (*)    All other components within normal limits  TROPONIN I (HIGH SENSITIVITY) - Abnormal; Notable for the following components:   Troponin I (High Sensitivity) 27 (*)    All other components within normal limits  TROPONIN I (HIGH SENSITIVITY) - Abnormal; Notable for the following components:   Troponin I (High Sensitivity) 28 (*)    All other components within normal limits  RESP PANEL BY RT-PCR (FLU A&B, COVID) ARPGX2  POC OCCULT BLOOD, ED    EKG EKG Interpretation  Date/Time:  Friday June 14 2020 16:22:19 EST Ventricular Rate:  66 PR Interval:  182 QRS Duration: 100 QT Interval:  414 QTC Calculation: 434 R Axis:   15 Text Interpretation: Normal sinus rhythm T wave abnormality, consider lateral ischemia Abnormal ECG  No old tracing to compare Confirmed by Aletta Edouard [phone]) on 06/14/2020 4:35:27 PM   Radiology DG Chest 1 View  Result Date: 06/14/2020 CLINICAL DATA:  Chest pain, chest tightness with shortness of breath. EXAM: CHEST  1 VIEW.  AP portable semi erect. COMPARISON:  None. FINDINGS: Single lead left chest wall cardiac defibrillator. The heart size and mediastinal contours are within normal limits. Aortic arch calcification. Streaky left mid lung airspace opacity. No pulmonary edema. Trace left pleural effusion. No pneumothorax. No acute osseous abnormality. Multilevel degenerative changes of the spine. IMPRESSION: Streaky left mid lung airspace opacity and trace left pleural effusion suggestive of infection/inflammation. Followup PA and lateral chest X-ray is recommended in 3-4 weeks to ensure resolution and exclude underlying malignancy. Electronically Signed   By: Iven Finn M.D.   On: 06/14/2020 16:56    Procedures Procedures (including critical care time)  Medications Ordered in ED Medications  azithromycin (ZITHROMAX) 500 mg in sodium chloride 0.9 % 250 mL IVPB (500 mg Intravenous New Bag/Given 06/14/20 2019)  furosemide (LASIX) injection 40 mg (has no administration in time range)  cefTRIAXone (ROCEPHIN) 1 g in sodium chloride 0.9 % 100 mL IVPB (0 g Intravenous Stopped 06/14/20 1922)    ED Course  I have reviewed the triage vital signs and the nursing notes.  Pertinent labs & imaging results that were available during my care of the patient were reviewed by me and considered in my medical decision making (see chart for details).  Clinical Course as of 06/14/20 2023  Fri Jun 14, 5937  1338 64 year old male with known coronary disease and decreased ejection fraction with AICD here with a few weeks of dyspnea on exertion and some chest pressure.  Symptoms occurred at rest today so he decided to come in and get evaluated.  Currently not having any chest pain.  Satting 100% on room  air.  EKG is not a STEMI.  Getting labs including troponin and BNP, CXR.  [MB]    Clinical Course User Index [MB] Hayden Rasmussen, MD   MDM Rules/Calculators/A&P                          64 year old male presenting to the department today for evaluation of exertional shortness of breath, chest pain for the last several weeks.  Has also had a mild cough.  Significant cardiac history.  Reviewed/interpreted labs CBC with leukocytosis at 13, hemoglobin 10.9 down from 12 noted in October  -No melena/hematochezia, Hemoccult negative CMP with elevated potassium of 5.3, low bicarb at 18, elevated creatinine 1.39, elevated bilirubin at 1.4, or liver enzymes normal Trops are marginally elevated but flat BNP is elevated at 600 Covid test negative  EKG Normal sinus rhythm T wave abnormality, consider lateral ischemia Abnormal ECG   - unchanged from prior  cxr reviewed/interpreted -   Streaky left mid lung airspace opacity and trace left pleural effusion suggestive of infection/inflammation. Followup PA and lateral chest X-ray is recommended in 3-4 weeks to ensure resolution and exclude underlying malignancy.  Patient with exertional shortness of breath/chest pain.  Does have a pneumonia on chest x-ray but given his cardiac history there is also concern for unstable angina.  His tropes are mildly elevated today, EKG is unchanged.  Will touch base with cardiology.  6:00 PM CONSULT with Melina Copa, PA-C.  She recommends hemoccult given new anemia. Cards will see pt to determine if heparin is needed. Recommend hospitalist admission.  7:39 PM CONSULT with Dr. Laural Golden who accepts patient for admission   Final Clinical Impression(s) / ED Diagnoses Final diagnoses:  Community acquired pneumonia, unspecified laterality  Chest pain, unspecified type    Rx / DC Orders ED Discharge Orders    None       Bishop Dublin 06/14/20 2024    Hayden Rasmussen, MD 06/14/20 2318

## 2020-06-14 NOTE — Telephone Encounter (Signed)
Pt c/o Shortness Of Breath: STAT if SOB developed within the last 24 hours or pt is noticeably SOB on the phone  1. Are you currently SOB (can you hear that pt is SOB on the phone)? no  2. How long have you been experiencing SOB? 1 week or 2  3. Are you SOB when sitting or when up moving around? On exertion   4. Are you currently experiencing any other symptoms? No   Patient states he has been having SOB on exertion for about a week or two.

## 2020-06-14 NOTE — ED Triage Notes (Signed)
Pt reports 2 episodes of chest tightness along with shortness of breath prior to arriving to ER. After arrival to ER with EMS pt stated he was experiencing the chest tightness and shortness of breath. Pt palced on 2L [place].  Symptoms resolved within 1 min. Pt oxygen noted to be 100% on 2L [place]. EMS states pt was symptom free and 99% on RA prior to arrival. Pt A&O and noted to be clammy. 20G L hand placed by EMS.

## 2020-06-14 NOTE — Consult Note (Addendum)
Cardiology Consultation:   Patient ID: Richard Flynn MRN: [id number]; DOB: [birth year]  Admit date: 06/14/2020 Date of Consult: 06/14/2020  Primary Care Provider: Finis Bud, MD [facility] Cardiologist: Kirk Ruths, MD  [facility] Electrophysiologist:  Will Meredith Leeds, MD    Patient Profile:   Richard Flynn is a 64 y.o. male with a hx of longstanding cardiomyopathy diagnosed in 2013 (last EF 35-40% in 4098), chronic systolic CHF, [employer] ICD implanted 2014, CAD with occluded RCA with collaterals collaterals by cath , DM, HTN, HLD, CKD stage III, pancreatitis, PAD with aortoiliac occlusion (med rx per [facility]), NSVT noted in device interrogation who is being seen today for the evaluation of exertional SOB at the request of Cortni Coutoure PA-C.  History of Present Illness:   He was previously followed by [facility] before establishing care with Dr. Stanford Breed and Dr. Curt Bears earlier this fall. Notes outline diagnosis of cardiomyopathy in 2013 at [facility] following recent treatment for PNA with EF 25% at that time. [facility] notes suggest cardiac cath showed occluded proximal RCA with left to right collaterals, 20-40% proximal LAD, 70% distal circumflex artery, but nothing to explain the severity of his cardiomyopathy. ICD placed 2014. Last EF referenced at 35-40% in 2017, report not available.  He presented to the ED with worsening SOB associated with some chest discomfort. For several weeks he's noticed while exerting himself he has had decreased exercise tolerance. Today however he developed episode of dyspnea at rest while in a recliner prompting him to come to the ED. Labs reveal new leukocytosis of 13.1 with elevated neutrophils, anemia wigh Hgb 10.9, hyperkalemia of 5.3, Cr 1.39 (similar to prior in 03/2020), hsTroponin 27-28 (repeat pending). CXR showed streaky left mid lung airspace opacity and trace left pleural effusion suggestive of  infection/inflammation, f/u recommended 3-4 weeks to ensure resolution and exclude underlying malignancy. Covid swab pending and hemoccult pending. EKG showed NSR 66bpm nonspecific ST/TW changes with TWI inferiorly and V5-V6, no change from 03/2020. Covid swab is pending. No known sick contacts, fever, chills, nausea, joint aches. Denies cough but some coughing noted during the interview. He denies any recent swelling or weight changes. He denies overt orthopnea but states he has to "watch the way he lays" sometimes.   Past Medical History:  Diagnosis Date  . CAD (coronary artery disease)    a. [facility] notes suggest cardiac cath 2013 showed occluded proximal RCA with left to right collaterals, 20-40% proximal LAD, 70% distal circumflex artery.  . Cardiomyopathy ([place])   . Chronic systolic CHF (congestive heart failure) ([place])   . CKD (chronic kidney disease), stage III ([place])   . Diabetes mellitus without complication ([place])   . Hyperlipidemia   . Hypertension   . NSVT (nonsustained ventricular tachycardia) ([place])    mentioned in device interrogation  . PAD (peripheral artery disease) ([place])     with aortoiliac occlusion (med rx per [facility])  . Pancreatitis     Past Surgical History:  Procedure Laterality Date  . CHOLECYSTECTOMY    . ICD IMPLANT       Home Medications:  Prior to Admission medications   Medication Sig Start Date End Date Taking? Authorizing Provider  allopurinol (ZYLOPRIM) 100 MG tablet Take 100 mg by mouth daily.   Yes [provider]  aspirin EC 81 MG tablet Take 81 mg by mouth daily. Swallow whole.   Yes [provider]  atorvastatin (LIPITOR) 80 MG tablet Take 1 tablet (  80 mg total) by mouth daily. 04/30/20  Yes Lelon Perla, MD  carvedilol (COREG) 6.25 MG tablet Take 6.25 mg by mouth 2 (two) times daily with a meal.   Yes [provider]  Cholecalciferol (VITAMIN D3) 125 MCG (5000 UT) CAPS Take 1 capsule by mouth daily.   Yes  [provider]  Coenzyme Q10 (COQ10 GUMMIES ADULT) 50 MG CHEW Chew 1 tablet by mouth at bedtime.   Yes [provider]  glipiZIDE (GLUCOTROL) 5 MG tablet Take 5 mg by mouth 2 (two) times daily before a meal.   Yes [provider]  LANTUS SOLOSTAR 100 UNIT/ML Solostar Pen Inject 24 Units into the skin at bedtime. 06/13/20  Yes [provider]  lisinopril (ZESTRIL) 40 MG tablet Take 40 mg by mouth daily.   Yes [provider]  metFORMIN (GLUCOPHAGE-XR) 500 MG 24 hr tablet Take 500 mg by mouth 2 (two) times daily.   Yes [provider]  Misc Natural Products (AIRBORNE ELDERBERRY) CHEW Chew 1 tablet by mouth at bedtime.   Yes [provider]  Multiple Vitamins-Minerals (MULTIVITAMIN ADULT) CHEW Chew 1 tablet by mouth at bedtime.   Yes [provider]  nitroGLYCERIN (NITROSTAT) 0.4 MG SL tablet Place under the tongue. 06/14/20  Yes [provider]  Omega-3 Fatty Acids (FISH OIL PO) Take 1 capsule by mouth daily.   Yes [provider]  spironolactone (ALDACTONE) 25 MG tablet Take 25 mg by mouth daily.   Yes [provider]    Inpatient Medications: Scheduled Meds: . furosemide  40 mg Intravenous BID   Continuous Infusions: . azithromycin     PRN Meds:   Allergies:   No Known Allergies  Social History:   Social History   Socioeconomic History  . Marital status: Married    Spouse name: Not on file  . Number of children: 0  . Years of education: Not on file  . Highest education level: Not on file  Occupational History  . Not on file  Tobacco Use  . Smoking status: Current Every Day Smoker    Last attempt to quit: 2012    Years since quitting: 9.9  . Smokeless tobacco: Never Used  . Tobacco comment: vape  Substance and Sexual Activity  . Alcohol use: Not Currently    Comment: Rare  . Drug use: Not Currently  . Sexual activity: Yes  Other Topics Concern  . Not on file  Social  History Narrative  . Not on file   Social Determinants of Health   Financial Resource Strain: Not on file  Food Insecurity: Not on file  Transportation Needs: Not on file  Physical Activity: Not on file  Stress: Not on file  Social Connections: Not on file  Intimate Partner Violence: Not on file    Family History:   Family History  Problem Relation Age of Onset  . CAD Mother      ROS:  Please see the history of present illness.  All other ROS reviewed and negative.     Physical Exam/Data:   Vitals:   06/14/20 1715 06/14/20 1800 06/14/20 1830 06/14/20 1915  BP: 113/78 129/82 (!) 134/92 (!) 136/91  Pulse: 68 70 74 71  Resp: 18 18 18    Temp: 98 F (36.7 C)     TempSrc: Oral     SpO2: 96% 95% 94% 92%  Weight:      Height:        Intake/Output Summary (Last 24  hours) at 06/14/2020 1938 Last data filed at 06/14/2020 1922 Gross per 24 hour  Intake 100 ml  Output --  Net 100 ml   Last 3 Weights 06/14/2020 05/16/2020 04/18/2020  Weight (lbs) 175 lb 179 lb 175 lb 12.8 oz  Weight (kg) 79.379 kg 81.194 kg 79.742 kg     Body mass index is 28.25 kg/m.  General: Well developed, well nourished WM, in no acute distress. Head: Normocephalic, atraumatic, sclera non-icteric, no xanthomas, nares are without discharge. Neck: Negative for carotid bruits. JVP not elevated. Lungs: Minimal crackles at bases. Breathing is unlabored. Heart: RRR S1 S2 without murmurs, rubs, or gallops.  Abdomen: Soft, non-tender, non-distended with normoactive bowel sounds. No rebound/guarding. Extremities: No clubbing or cyanosis. No edema. Distal pedal pulses are 2+ and equal bilaterally. Neuro: Alert and oriented X 3. Moves all extremities spontaneously. Psych:  Responds to questions appropriately with a normal affect.  EKG:  The EKG was personally reviewed and demonstrates: EKG showed NSR 66bpm nonspecific ST/TW changes with TWI inferiorly and V5-V6, no change from 03/2020 Telemetry:  Telemetry  was personally reviewed and demonstrates:  NSR  Relevant CV Studies: N/A  Laboratory Data:  High Sensitivity Troponin:   Recent Labs  Lab 06/14/20 1547 06/14/20 1823  TROPONINIHS 27* 28*     Chemistry Recent Labs  Lab 06/14/20 1547  NA 135  K 5.3*  CL 106  CO2 18*  GLUCOSE 187*  BUN 22  CREATININE 1.39*  CALCIUM 8.3*  GFRNONAA 56*  ANIONGAP 11    Recent Labs  Lab 06/14/20 1547  PROT 6.9  ALBUMIN 3.7  AST 32  ALT 37  ALKPHOS 73  BILITOT 1.4*   Hematology Recent Labs  Lab 06/14/20 1547  WBC 13.1*  RBC 3.62*  HGB 10.9*  HCT 34.4*  MCV 95.0  MCH 30.1  MCHC 31.7  RDW 14.7  PLT 177   BNP Recent Labs  Lab 06/14/20 1547  BNP 603.0*    DDimer No results for input(s): DDIMER in the last 168 hours.   Radiology/Studies:  DG Chest 1 View  Result Date: 06/14/2020 CLINICAL DATA:  Chest pain, chest tightness with shortness of breath. EXAM: CHEST  1 VIEW.  AP portable semi erect. COMPARISON:  None. FINDINGS: Single lead left chest wall cardiac defibrillator. The heart size and mediastinal contours are within normal limits. Aortic arch calcification. Streaky left mid lung airspace opacity. No pulmonary edema. Trace left pleural effusion. No pneumothorax. No acute osseous abnormality. Multilevel degenerative changes of the spine. IMPRESSION: Streaky left mid lung airspace opacity and trace left pleural effusion suggestive of infection/inflammation. Followup PA and lateral chest X-ray is recommended in 3-4 weeks to ensure resolution and exclude underlying malignancy. Electronically Signed   By: Iven Finn M.D.   On: 06/14/2020 16:56     Assessment and Plan:   1. Shortness of breath (also some chest discomfort) - symptoms smoldering for several weeks, worse with exertion, but today notable on rest - > currently asymptomatic (only received abx so far) - some question of concomitant infection with CXR findings and new leukocytosis -> started on empiric rx for  PNA, Covid swab pending - per d/w MD, start IV Lasix 40mg  BID and follow symptoms with above measures - order updated echo  - if symptoms persists despite above measures, may need to consider updated ischemic eval - not tachycardic, tachypneic or hypoxic so PE seems less likely  2. Chronic systolic CHF with possible acute exacerbation - manage in context of  the above  3. CAD with mildly elevated troponin - troponins low/flat, not felt to represent ACS - d/w MD, hold off heparin given active sx presently - continue ASA, BB, statin  4. Essential HTN - SBP 113-149 in the ED - follow with above measures  5. CKD stage IIIa with hyperkalemia - given K 5.3, recommend hold spironolactone - Cr seems at baseline, follow with diuresis - per Dr. Marlou Porch, recommend changing lisinopril to losartan 50mg  daily in anticipation of potentially transitioning to [facility] if K / Cr OK  6. Anemia - unclear cause, ? related to CKD - further eval per IM - hemoccult pending  7. IDDM - would hold metformin inpatient in case procedures needed - consider addition of SLGT-2i given CAD, CHF      TIMI Risk Score for Unstable Angina or Non-ST Elevation MI:   The patient's TIMI risk score is 5, which indicates a 26% risk of all cause mortality, new or recurrent myocardial infarction or need for urgent revascularization in the next 14 days.  New York Heart Association (NYHA) Functional Class NYHA Class III        For questions or updates, please contact [facility] Please consult [email or web address] for contact info under    Signed, Richard Pitter, PA-C  06/14/2020 7:38 PM   Personally seen and examined. Agree with above.   64 year old with both ischemic/nonischemic cardiomyopathy with chronic systolic heart failure, ICD, occluded RCA with collaterals here with intermittent shortness of breath that worsened just before transport to emergency department.  Intermittent orthopnea.  Since being here in the  ER, antibiotics have been administered and he does feel better but not quite at baseline.  Does not note any significant swelling.  No significant chest pain.  Chest x-ray personally reviewed demonstrates interstitial edema pattern.  No significant pleural effusions.  GEN: Well nourished, well developed, in no acute distress  HEENT: normal  Neck: no JVD, carotid bruits, or masses Cardiac: RRR; no murmurs, rubs, or gallops,no edema  Respiratory: Minimal crackles at base bilaterally, normal work of breathing GI: soft, nontender, nondistended, + BS MS: no deformity or atrophy  Skin: warm and dry, no rash Neuro:  Alert and Oriented x 3, Strength and sensation are intact Psych: euthymic mood, full affect  Troponin mid 20s flat.  Creatinine 1.39 potassium 5.3 hemoglobin 10.9  Assessment and plan:  Acute on chronic systolic heart failure -Given his underlying ejection fraction of 35 to 09%, I certainly could be holding onto additional intravascular volume.  Does not seem to manifested as lower extremity edema. -Administer 40 mg of IV Lasix now and continue with twice daily 40 mg twice a day.  He is not on any long-term furosemide at home.  Perhaps at discharge, may require. -We will transition him from lisinopril over to losartan in anticipation of potential Entresto -Mild leukocytosis may be secondary to acute exacerbation.  Just in case, antibiotics have been administered as pneumonia is challenging to differentiate on x-ray.  CAD -Occluded RCA moderate disease elsewhere -No active anginal symptoms -Low-level troponin flat.  Does not need IV heparin.  This is not acute coronary syndrome.  Low level troponin is likely secondary to demand ischemia in the setting of heart failure.  Anemia -Mild reduction from 12-10 hemoglobin.  Per primary team.  Chronic kidney disease stage IIIa -Holding on spironolactone given his potassium of 5.3.  Candee Furbish, MD

## 2020-06-14 NOTE — H&P (Signed)
Date: 06/14/2020               Patient Name:  Richard Flynn MRN: [id number]  DOB: [birth year] Age / Sex: 64 y.o., male   PCP: Finis Bud, MD         Medical Service: Internal Medicine Teaching Service         Attending Physician: Dr. Dorian Pod    First Contact: Dr. Lisabeth Devoid Pager: [phone]  Second Contact: Dr. Marianna Payment Pager: [phone]       After Hours (After 5p/  First Contact Pager: [phone]  weekends / holidays): Second Contact Pager: [phone]   Chief Complaint: shortness of breath with exertion  History of Present Illness:   Richard Flynn is a 64 y.o. male with hx of both ischemic and nonischemic cardiomyopathy, HFrEF (last EF 35-40% in 2017) s/p ICD in 2014, CAD (last cath 2013 with occluded proximal RCA with left to right collaterals), DM, HTN, HLD, CKD 3, PAD presenting with SOB for roughly the past 2 weeks. States that this is episodic and initially occurred only with exertion then resolves with rest, but this morning occurred at rest. During these episodes has associated chest tightness, clamminess, and feels hot. Pain is located over the mid chest, mild, and non-radiating. Not pleuritic. 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 So his current symptoms are new to him. Upon further questioning, he has been having episodes like this going back several years but not as frequent or severe as recently. Does not recall any inciting events 2 weeks ago including illness, surgery, medication change, or injury. He did have a long road trip to [place] roughly 2 weeks ago but this started after the onset of his symptoms. He reports getting out of the car every 2-3 hours. He also endorses a cough for roughly 2 weeks which produces yellow sputum. Associated congestion. Denies fever, chills. Normally states that he gets winded with exertion, but this does not limit his normal activities. Endorses intermittent  orthopnea but denies leg swelling. Denies nausea, vomiting, visual change, passing out, dizziness.    ED Course: Troponin of 27 -> 28. CXR with some opacity concerning for pneumonia. Workup otherwise significant for WBC of 13.1, hgb 10.9, K of 5.3. Started on empiric Abx for possible pneumonia. Cardiology consulted and recommended holding off on heparin for now and treating for possible HFrEF exacerbation with Lasix.  Current Outpatient Medications  Medication Instructions  . allopurinol (ZYLOPRIM) 100 mg, Oral, Daily  . aspirin EC 81 mg, Oral, Daily, Swallow whole.   Marland Kitchen atorvastatin (LIPITOR) 80 mg, Oral, Daily  . carvedilol (COREG) 6.25 mg, Oral, 2 times daily with meals  . Cholecalciferol (VITAMIN D3) 125 MCG (5000 UT) CAPS 1 capsule, Oral, Daily  . Coenzyme Q10 (COQ10 GUMMIES ADULT) 50 MG CHEW 1 tablet, Oral, Daily at bedtime  . glipiZIDE (GLUCOTROL) 5 mg, Oral, 2 times daily before meals  . Lantus SoloStar 24 Units, Subcutaneous, Daily at bedtime  . lisinopril (ZESTRIL) 40 mg, Oral, Daily  . metFORMIN (GLUCOPHAGE-XR) 500 mg, Oral, 2 times daily  . Misc Natural Products (AIRBORNE ELDERBERRY) CHEW 1 tablet, Oral, Daily at bedtime  . Multiple Vitamins-Minerals (MULTIVITAMIN ADULT) CHEW 1 tablet, Oral, Daily at bedtime  . nitroGLYCERIN (NITROSTAT) 0.4 MG SL tablet Sublingual  . Omega-3 Fatty Acids (FISH OIL PO) 1 capsule, Oral, Daily  . spironolactone (ALDACTONE) 25 mg,  Oral, Daily    Allergies as of 06/14/2020  . (No Known Allergies)    Past Medical History:  Diagnosis Date  . CAD (coronary artery disease)    a. [facility] notes suggest cardiac cath 2013 showed occluded proximal RCA with left to right collaterals, 20-40% proximal LAD, 70% distal circumflex artery.  . Cardiomyopathy ([place])   . Chronic systolic CHF (congestive heart failure) ([place])   . CKD (chronic kidney disease), stage III ([place])   . Diabetes mellitus without complication ([place])   . Hyperlipidemia   . Hypertension   .  NSVT (nonsustained ventricular tachycardia) ([place])    mentioned in device interrogation  . PAD (peripheral artery disease) ([place])     with aortoiliac occlusion (med rx per [facility])  . Pancreatitis    Past Surgical History:  Procedure Laterality Date  . CHOLECYSTECTOMY    . ICD IMPLANT      Family History  Problem Relation Age of Onset  . CAD Mother     Social History   Tobacco Use  . Smoking status: Former Smoker    Packs/day: 1.00    Years: 50.00    Pack years: 50.00    Quit date: 2012    Years since quitting: 9.9  . Smokeless tobacco: Never Used  . Tobacco comment: vape  Substance Use Topics  . Alcohol use: Yes    Comment: 1 beer per month  . Drug use: Not Currently   Quit smoking 6-7 years ago, smoked for 45-50 years. Vapes occasionally, less than a cartridge a day. Drinks beer maybe once a month. Stopped drinking after pancreatitis. No illicit drug use. Lives in [place] with his wife. Completes ADL's himself.   Review of Systems: Review of Systems  Constitutional: Negative for chills and fever.  HENT: Positive for congestion. Negative for sore throat.   Eyes: Negative for blurred vision and double vision.  Respiratory: Positive for cough, sputum production and shortness of breath. Negative for hemoptysis.   Cardiovascular: Positive for chest pain, orthopnea and claudication. Negative for palpitations and leg swelling.  Gastrointestinal: Negative for abdominal pain, constipation, diarrhea, nausea and vomiting.  Genitourinary: Negative for dysuria and frequency.  Musculoskeletal: Negative for falls, joint pain and myalgias.  Skin: Negative for itching and rash.  Neurological: Negative for dizziness and loss of consciousness.     Physical Exam: Blood pressure (!) 136/91, pulse 71, temperature 98 F (36.7 C), temperature source Oral, resp. rate 18, height 5\' 6"  (1.676 m), weight 79.4 kg, SpO2 92 %. Physical Exam Vitals and nursing note reviewed.   Constitutional:      Appearance: He is obese.  HENT:     Head: Normocephalic and atraumatic.  Eyes:     Extraocular Movements: Extraocular movements intact.  Neck:     Vascular: JVD (mild) present.  Cardiovascular:     Rate and Rhythm: Normal rate and regular rhythm.     Heart sounds: Heart sounds are distant. No murmur heard. No friction rub. No gallop.   Pulmonary:     Effort: Pulmonary effort is normal. No tachypnea, accessory muscle usage or respiratory distress.     Breath sounds: Examination of the left-middle field reveals rales. Examination of the left-lower field reveals rales. Decreased breath sounds and rales present.     Comments: Became short of breath provoked by exertion of respiratory exam, which resolved in <1 minute with rest and supplemental O2 Chest:     Chest wall: No deformity or tenderness.  Abdominal:  General: Bowel sounds are normal.     Palpations: Abdomen is soft.  Musculoskeletal:     Cervical back: Normal range of motion and neck supple.     Right lower leg: No edema.     Left lower leg: No edema.  Skin:    General: Skin is warm and dry.  Neurological:     General: No focal deficit present.     Mental Status: He is alert and oriented to person, place, and time.  Psychiatric:        Mood and Affect: Mood normal.        Behavior: Behavior normal.      EKG: personally reviewed my interpretation is nonspecific mild ST changes, T wave inversions which is new in III and V1 but pre-existing in AVF V5 V6  CXR: personally reviewed my interpretation is ICD in place, streaky L mid lung opacity, small L-sided pleural effusion. Concerning for pneumonia, but not conclusive.  BNP    Component Value Date/Time   BNP 603.0 (H) 06/14/2020 1547   CBC    Component Value Date/Time   WBC 13.1 (H) 06/14/2020 1547   RBC 3.62 (L) 06/14/2020 1547   HGB 10.9 (L) 06/14/2020 1547   HCT 34.4 (L) 06/14/2020 1547   PLT 177 06/14/2020 1547   MCV 95.0 06/14/2020  1547   MCH 30.1 06/14/2020 1547   MCHC 31.7 06/14/2020 1547   RDW 14.7 06/14/2020 1547   LYMPHSABS 1.0 06/14/2020 1547   MONOABS 0.7 06/14/2020 1547   EOSABS 0.1 06/14/2020 1547   BASOSABS 0.1 06/14/2020 1547    CMP     Component Value Date/Time   NA 135 06/14/2020 1547   K 5.3 (H) 06/14/2020 1547   CL 106 06/14/2020 1547   CO2 18 (L) 06/14/2020 1547   GLUCOSE 187 (H) 06/14/2020 1547   BUN 22 06/14/2020 1547   CREATININE 1.39 (H) 06/14/2020 1547   CALCIUM 8.3 (L) 06/14/2020 1547   PROT 6.9 06/14/2020 1547   ALBUMIN 3.7 06/14/2020 1547   AST 32 06/14/2020 1547   ALT 37 06/14/2020 1547   ALKPHOS 73 06/14/2020 1547   BILITOT 1.4 (H) 06/14/2020 1547   GFRNONAA 56 (L) 06/14/2020 1547     Assessment & Plan by Problem: Active Problems:   Dyspnea   DECKLIN WEDDINGTON is a 64 y.o. male with hx of both ischemic and nonischemic cardiomyopathy, HFrEF (last EF 35-40% in 2017) s/p ICD in 2014, CAD (last cath 2013 with occluded proximal RCA with left to right collaterals), DM, HTN, HLD, CKD 3, PAD with aortoiliac occlusion presenting with dyspnea on exertion for the past 2 weeks with mild accompanying chest tightness. Also some concern for possible pneumonia with cough, leukocytosis, and streaky opacity on CXR. Also concern for HFrEF exacerbation with BNP of 603. Patiently currently resting comfortably, though he had an episode of dyspnea during our pulmonary exam which resolved within 1 minute with rest and supplemental O2.  Dyspnea on exertion with chest tightness Ischemic and non-ischemic cardiomyopathy Coronary artery disease Acute on chronic HFrEF  Chief complaint of dyspnea on exertion over past 2 weeks, with an episode at rest this morning, accompanied by chest tightness and diaphoresis. Extensive cardiac history with MI x2 and cardiomyopathy with ischemic component but out of proportion to extent of ischemia. Last echo was in 2017 in [facility] system, with EF 35-40% in 2017, moderate LV  dilation, global hypokinesis with inferior akinesis, moderate LE enlargement, 2+ mitral regurgitation. Last cath was in 2013  and showed occluded proximal RCA with left to right collaterals, 20-40% proximal LAD, 70% distal circumflex artery. He endorses symptoms consistent with NYHA class 2 at baseline. Was being followed by [facility] cardiology, but recently changed to [facility] system and is seeing Dr. Stanford Breed and Dr. Curt Bears.  Troponin of 27 -> 28 this admission. Cardiology saw patient in the ED and believes this is unlikely to represent ACS. BNP 603, but not severely volume overloaded on exam. Current weight is at his baseline. With some findings suggesting pulmonary infection, this could be inciting factor for a HFrEF exacerbation. PE less likely given lack of hypoxia, tachycardia, or tachypnea.  -Cardiology following, appreciate recs     -hold off on heparin given flat trops     -continue home ASA 81mg , Coreg 6.25mg  BID, and lipitor 80mg      -start IV Lasix 40mg  BID     -f/u echo     -if persistent Sx, may consider updated ischemic eval  Productive cough with leukocytosis and streaky opacity on chest xray Pneumonia is a possibility given these findings and history. CXR shows some hazy opacity, but not a clear lobar pneumonia. Negative COVID-19 and influenza A and B. WBC of 13.1. Could be an inciting factor for HFrEF exacerbation.  -received 1 dose of azithromycin and ceftriaxone in ED -f/u PCT -daily CBC  HTN  BP 120s-140s/80s-90s. Home regimen of Lisinopril 40mg  daily, Coreg 6.25mg  BID, spironolactone 25mg  -switch lisinopril to losartan 50mg  per Cards, for possible transition to Entresto -holding spironolactone for hyperkalemia -continue home Coreg  AKI vs AKI on CKD 3 Creatinine of 1.39 on admission, no other values in our system and most recent value in [facility] was a normal level in 2014. -daily BMP -avoid nephrotoxins  Hyperkalemia K of 5.3 on admission. EKG without concerning  changes. -holding home spironolactone  Normocytic anemia Hgb of 10.9 on admission. Hemoccult negative. No reported evidence of bleeding. Most recent available labs otherwise are from 2014 and had normal hgb. -daily CBC -monitor  IDDM Last A1c of 7.6 in 2017 per [facility]. Home regimen of glipizide 5mg  BID, Lantus 24 units at bedtime, metformin XR 100mg  BID. -resume home Lantus -start SSI -f/u A1c -may consider SGLT-2 inhibitor given CHF  Peripheral artery disease Last ABI in April 2021 was 0.73 on the right and 0.73 on the left. Iliac mapping in 2013 at [facility]. Results are not viewable, but has aortoiliac occlusion per [facility] documentation. Endorses claudication with long distance, but does not alter lifestyle. Home meds of lipitor 80mg  and omega 3 fatty acids. -continue home meds  Dispo: Admit patient to Inpatient with expected length of stay greater than 2 midnights.  Signed: Andrew Au, MD 06/14/2020, 7:43 PM  Pager: [phone]  After 5pm on weekdays and 1pm on weekends: On Call pager: [phone]

## 2020-06-14 NOTE — Telephone Encounter (Signed)
Attempted to call patient. Phone sounded like someone picked up but could not get anyone to answer. Will try again at a later time.

## 2020-06-15 ENCOUNTER — Inpatient Hospital Stay (HOSPITAL_COMMUNITY): Payer: HMO

## 2020-06-15 DIAGNOSIS — I5023 Acute on chronic systolic (congestive) heart failure: Secondary | ICD-10-CM

## 2020-06-15 DIAGNOSIS — R0600 Dyspnea, unspecified: Secondary | ICD-10-CM

## 2020-06-15 DIAGNOSIS — I340 Nonrheumatic mitral (valve) insufficiency: Secondary | ICD-10-CM

## 2020-06-15 LAB — GLUCOSE, CAPILLARY
Glucose-Capillary: 70 mg/dL (ref 70–99)
Glucose-Capillary: 263 mg/dL — ABNORMAL HIGH (ref 70–99)
Glucose-Capillary: 94 mg/dL (ref 70–99)
Glucose-Capillary: 162 mg/dL — ABNORMAL HIGH (ref 70–99)

## 2020-06-15 LAB — CBC
HCT: 33.3 % — ABNORMAL LOW (ref 39.0–52.0)
Hemoglobin: 11 g/dL — ABNORMAL LOW (ref 13.0–17.0)
MCH: 30.5 pg (ref 26.0–34.0)
MCHC: 33 g/dL (ref 30.0–36.0)
MCV: 92.2 fL (ref 80.0–100.0)
Platelets: 164 10*3/uL (ref 150–400)
RBC: 3.61 MIL/uL — ABNORMAL LOW (ref 4.22–5.81)
RDW: 14.6 % (ref 11.5–15.5)
WBC: 11.3 10*3/uL — ABNORMAL HIGH (ref 4.0–10.5)
nRBC: 0 % (ref 0.0–0.2)

## 2020-06-15 LAB — ECHOCARDIOGRAM COMPLETE
Area-P 1/2: 2.52 cm2
Calc EF: 22.6 %
Height: 66 in
MV M vel: 4.03 m/s
MV Peak grad: 65 mmHg
Radius: 0.4 cm
S' Lateral: 5 cm
Single Plane A2C EF: 15.8 %
Single Plane A4C EF: 28.8 %
Weight: 2704 oz

## 2020-06-15 LAB — BASIC METABOLIC PANEL
Anion gap: 14 (ref 5–15)
BUN: 27 mg/dL — ABNORMAL HIGH (ref 8–23)
CO2: 21 mmol/L — ABNORMAL LOW (ref 22–32)
Calcium: 8.9 mg/dL (ref 8.9–10.3)
Chloride: 104 mmol/L (ref 98–111)
Creatinine, Ser: 1.43 mg/dL — ABNORMAL HIGH (ref 0.61–1.24)
GFR, Estimated: 54 mL/min — ABNORMAL LOW (ref >60)
Glucose, Bld: 67 mg/dL — ABNORMAL LOW (ref 70–99)
Potassium: 4.2 mmol/L (ref 3.5–5.1)
Sodium: 139 mmol/L (ref 135–145)

## 2020-06-15 LAB — HEMOGLOBIN A1C
Hgb A1c MFr Bld: 6.7 % — ABNORMAL HIGH (ref 4.8–5.6)
Mean Plasma Glucose: 145.59 mg/dL

## 2020-06-15 LAB — HIV ANTIBODY (ROUTINE TESTING W REFLEX): HIV Screen 4th Generation wRfx: NONREACTIVE

## 2020-06-15 LAB — PROCALCITONIN: Procalcitonin: <0.1 ng/mL

## 2020-06-15 MED ORDER — INFLUENZA VAC A&B SA ADJ QUAD 0.5 ML IM PRSY
0.5000 mL | PREFILLED_SYRINGE | INTRAMUSCULAR | Status: AC
  Administered 2020-06-18: 0.5 mL via INTRAMUSCULAR
  Filled 2020-06-15: qty 0.5

## 2020-06-15 MED ORDER — IOHEXOL 350 MG/ML SOLN
75.0000 mL | Freq: Once | INTRAVENOUS | Status: AC | PRN
  Administered 2020-06-15: 75 mL via INTRAVENOUS

## 2020-06-15 NOTE — Progress Notes (Signed)
HD#1 Subjective:  Overnight Events: Admitted overnight  Patient resting comfortably at bedside chair.  Patient states that his shortness of breath has improved since being admitted.  He states that his shortness of breath has progressed over the last 2 weeks.  Initially was worse with exertion and now has began to bother him at rest.  He states that he has some mild chest pain in the central chest region during periods of shortness of breath, but denies symptoms of typical cardiac chest pain.  Patient no associated symptoms concerning for infection such as fevers, chills, productive cough, wheezing, myalgias.  Furthermore, patient only has mild symptoms concerning for CHF exacerbation such as shortness of breath with exertion and potentially some orthopnea.  He does state that his need to use multiple pillows to sleep has been a longstanding problem for him and has difficult time articulating why he needs to use multiple pillows to sleep.  Patient does have a significant history of tobacco use which could be contributing he denies ever being evaluated for COPD.  Objective:  Vital signs in last 24 hours: Vitals:   06/15/20 0233 06/15/20 0320 06/15/20 0350 06/15/20 0733  BP: 120/60 122/64  115/76  Pulse: 67 62  71  Resp: 20 17  18   Temp:  98 F (36.7 C)  98.2 F (36.8 C)  TempSrc:    Oral  SpO2: 99% 99%  97%  Weight:   76.7 kg   Height:       Supplemental O2: Room Air SpO2: 97 % O2 Flow Rate (L/min):0   Physical Exam:  Physical Exam Constitutional:      Appearance: Normal appearance.  HENT:     Head: Normocephalic and atraumatic.  Eyes:     Extraocular Movements: Extraocular movements intact.  Cardiovascular:     Rate and Rhythm: Normal rate.     Pulses: Normal pulses.     Heart sounds: Normal heart sounds.  Pulmonary:     Effort: Pulmonary effort is normal.     Breath sounds: Normal breath sounds.     Comments: Patient has decreased air movement with diffuse not taking a  full deep breath.  Mild bibasilar crackles Abdominal:     General: Bowel sounds are normal.     Palpations: Abdomen is soft.     Tenderness: There is no abdominal tenderness.  Musculoskeletal:        General: Normal range of motion.     Cervical back: Normal range of motion.     Right lower leg: No edema.     Left lower leg: No edema.  Skin:    General: Skin is warm and dry.  Neurological:     Mental Status: He is alert and oriented to person, place, and time. Mental status is at baseline.  Psychiatric:        Mood and Affect: Mood normal.     Filed Weights   06/14/20 1540 06/15/20 0350  Weight: 79.4 kg 76.7 kg     Intake/Output Summary (Last 24 hours) at 06/15/2020 1122 Last data filed at 06/15/2020 1000 Gross per 24 hour  Intake 829.71 ml  Output 875 ml  Net -45.29 ml   Net IO Since Admission: -45.29 mL [06/15/20 1122]  Pertinent Labs: CBC Latest Ref Rng & Units 06/15/2020 06/14/2020  WBC 4.0 - 10.5 K/uL 11.3(H) 13.1(H)  Hemoglobin 13.0 - 17.0 g/dL 11.0(L) 10.9(L)  Hematocrit 39.0 - 52.0 % 33.3(L) 34.4(L)  Platelets 150 - 400 K/uL 164 177  CMP Latest Ref Rng & Units 06/15/2020 06/14/2020  Glucose 70 - 99 mg/dL 67(L) 187(H)  BUN 8 - 23 mg/dL 27(H) 22  Creatinine 0.61 - 1.24 mg/dL 1.43(H) 1.39(H)  Sodium 135 - 145 mmol/L 139 135  Potassium 3.5 - 5.1 mmol/L 4.2 5.3(H)  Chloride 98 - 111 mmol/L 104 106  CO2 22 - 32 mmol/L 21(L) 18(L)  Calcium 8.9 - 10.3 mg/dL 8.9 8.3(L)  Total Protein 6.5 - 8.1 g/dL - 6.9  Total Bilirubin 0.3 - 1.2 mg/dL - 1.4(H)  Alkaline Phos 38 - 126 U/L - 73  AST 15 - 41 U/L - 32  ALT 0 - 44 U/L - 37    Imaging: DG Chest 1 View  Result Date: 06/14/2020 CLINICAL DATA:  Chest pain, chest tightness with shortness of breath. EXAM: CHEST  1 VIEW.  AP portable semi erect. COMPARISON:  None. FINDINGS: Single lead left chest wall cardiac defibrillator. The heart size and mediastinal contours are within normal limits. Aortic arch  calcification. Streaky left mid lung airspace opacity. No pulmonary edema. Trace left pleural effusion. No pneumothorax. No acute osseous abnormality. Multilevel degenerative changes of the spine. IMPRESSION: Streaky left mid lung airspace opacity and trace left pleural effusion suggestive of infection/inflammation. Followup PA and lateral chest X-ray is recommended in 3-4 weeks to ensure resolution and exclude underlying malignancy. Electronically Signed   By: Iven Finn M.D.   On: 06/14/2020 16:56    Assessment/Plan:   Active Problems:   Dyspnea   Patient Summary: Richard Flynn is a 64 y.o. male with hx of both ischemic and nonischemic cardiomyopathy, HFrEF (last EF 35-40% in 2017) s/p ICD in 2014, CAD (last cath 2013 with occluded proximal RCA with left to right collaterals), DM, HTN, HLD, CKD 3, PAD with aortoiliac occlusion presenting with dyspnea on exertion for the past 2 weeks with mild accompanying chest tightness. Also some concern for possible pneumonia with cough, leukocytosis, and streaky opacity on CXR. Also concern for HFrEF exacerbation with BNP of 603. Patiently currently resting comfortably, though he had an episode of dyspnea during our pulmonary exam which resolved within 1 minute with rest and supplemental O2.  Dyspnea on exertion with chest tightness Ischemic and non-ischemic cardiomyopathy Coronary artery disease Acute on chronic HFrEF Patient states that his shortness of breath is improved despite only having minimal urine output overnight.  Patient does not look overtly hypervolemic.  He has no increased JVD, abdominal bloating, lower extremity edema.  He is currently not having chest pain. Echo is pending.  Patient is not on home diuretic therapy for heart failure.  Regardless he will likely need to discontinue the IV furosemide. -Continue aspirin Coreg and BiDil, and Lipitor -Echocardiogram pending -Discontinue IV furosemide -Appreciate cardiology's  recommendations.  May need ischemic evaluation in the future.  Shortness of breath: Lobar pneumonia Left pleural effusion Patient was treated for possible left lobar with 1 dose of azithromycin and ceftriaxone in the ED.  He tested negative for Covid and influenza.  Patient does not have any overt signs or symptoms of infection present.  I will get a procalcitonin today.  If this is elevated I will continue antibiotics.  If not I will discontinue the antibiotics.  Patient shortness of breath could be secondary to his pleural effusion with decreased ability to take deep breaths.  We will need to repeat x-ray in 3 to 4 weeks to make sure there is resolution of the opacity on the left lower lung.  Pleural effusion persist  he may need a diagnostic thoracentesis. -Calcitonin  HTN  BP 120s-140s/80s-90s. Home regimen of Lisinopril 40mg  daily, Coreg 6.25mg  BID, spironolactone 25mg  -Continue Coreg and losartan -Hold spironolactone  CKD 3 Patient kidney function is stable overnight.  This is the patient's baseline creatinine. -daily BMP -avoid nephrotoxins  Hyperkalemia Resolved  Normocytic anemia Patient has normocytic anemia likely secondary to his chronic kidney disease.  Recommend follow-up in the outpatient setting.  IDDM Patient had an A1c of 6.7. -Continue home Lantus and SSI    Diet: Heart Healthy IVF: None VTE: Enoxaparin Code: Full PT/OT recs: None ID:   Dispo: Anticipated discharge to Home pending further evaluation.  Lawerance Cruel, D.O.  Internal Medicine Resident, PGY-2 [facility]  Pager: [phone] 11:22 AM, 06/15/2020   Please contact the on call pager after 5 pm and on weekends at [phone].

## 2020-06-15 NOTE — Progress Notes (Signed)
Progress Note  Patient Name: Richard Flynn Date of Encounter: 06/15/2020  [facility] Cardiologist: Kirk Ruths, MD   Subjective  SOB improving  Inpatient Medications    Scheduled Meds: . allopurinol  100 mg Oral Daily  . aspirin EC  81 mg Oral Daily  . atorvastatin  80 mg Oral Daily  . carvedilol  6.25 mg Oral BID WC  . cholecalciferol  5,000 Units Oral Daily  . enoxaparin (LOVENOX) injection  40 mg Subcutaneous Q24H  . furosemide  40 mg Intravenous BID  . [START ON 06/16/2020] influenza vaccine adjuvanted  0.5 mL Intramuscular Tomorrow-1000  . insulin aspart  0-9 Units Subcutaneous TID WC  . insulin glargine  24 Units Subcutaneous QHS  . losartan  50 mg Oral Daily  . multivitamin with minerals  1 tablet Oral QHS  . omega-3 acid ethyl esters  1 g Oral Daily   Continuous Infusions:  PRN Meds: acetaminophen **OR** acetaminophen, ondansetron **OR** ondansetron (ZOFRAN) IV   Vital Signs    Vitals:   06/15/20 0233 06/15/20 0320 06/15/20 0350 06/15/20 0733  BP: 120/60 122/64  115/76  Pulse: 67 62  71  Resp: 20 17  18   Temp:  98 F (36.7 C)  98.2 F (36.8 C)  TempSrc:    Oral  SpO2: 99% 99%  97%  Weight:   76.7 kg   Height:        Intake/Output Summary (Last 24 hours) at 06/15/2020 0755 Last data filed at 06/15/2020 0700 Gross per 24 hour  Intake 589.71 ml  Output 300 ml  Net 289.71 ml   Last 3 Weights 06/15/2020 06/14/2020 05/16/2020  Weight (lbs) 169 lb 175 lb 179 lb  Weight (kg) 76.658 kg 79.379 kg 81.194 kg      Telemetry    SR - Personally Reviewed  ECG    n/a - Personally Reviewed  Physical Exam   GEN: No acute distress.   Neck: No JVD Cardiac: RRR, no murmurs, rubs, or gallops.  Respiratory: Clear to auscultation bilaterally. GI: Soft, nontender, non-distended  MS: No edema; No deformity. Neuro:  Nonfocal  Psych: Normal affect   Labs    High Sensitivity Troponin:   Recent Labs  Lab 06/14/20 1547 06/14/20 1823   TROPONINIHS 27* 28*      Chemistry Recent Labs  Lab 06/14/20 1547 06/15/20 0414  NA 135 139  K 5.3* 4.2  CL 106 104  CO2 18* 21*  GLUCOSE 187* 67*  BUN 22 27*  CREATININE 1.39* 1.43*  CALCIUM 8.3* 8.9  PROT 6.9  --   ALBUMIN 3.7  --   AST 32  --   ALT 37  --   ALKPHOS 73  --   BILITOT 1.4*  --   GFRNONAA 56* 54*  ANIONGAP 11 14     Hematology Recent Labs  Lab 06/14/20 1547 06/15/20 0414  WBC 13.1* 11.3*  RBC 3.62* 3.61*  HGB 10.9* 11.0*  HCT 34.4* 33.3*  MCV 95.0 92.2  MCH 30.1 30.5  MCHC 31.7 33.0  RDW 14.7 14.6  PLT 177 164    BNP Recent Labs  Lab 06/14/20 1547  BNP 603.0*     DDimer No results for input(s): DDIMER in the last 168 hours.   Radiology    DG Chest 1 View  Result Date: 06/14/2020 CLINICAL DATA:  Chest pain, chest tightness with shortness of breath. EXAM: CHEST  1 VIEW.  AP portable semi erect. COMPARISON:  None. FINDINGS: Single lead left  chest wall cardiac defibrillator. The heart size and mediastinal contours are within normal limits. Aortic arch calcification. Streaky left mid lung airspace opacity. No pulmonary edema. Trace left pleural effusion. No pneumothorax. No acute osseous abnormality. Multilevel degenerative changes of the spine. IMPRESSION: Streaky left mid lung airspace opacity and trace left pleural effusion suggestive of infection/inflammation. Followup PA and lateral chest X-ray is recommended in 3-4 weeks to ensure resolution and exclude underlying malignancy. Electronically Signed   By: Iven Finn M.D.   On: 06/14/2020 16:56    Cardiac Studies    Patient Profile     Richard Flynn is a 64 y.o. male with a hx of longstanding cardiomyopathy diagnosed in 2013 (last EF 35-40% in 5456), chronic systolic CHF, [employer] ICD implanted 2014, CAD with occluded RCA with collaterals collaterals by cath , DM, HTN, HLD, CKD stage III, pancreatitis, PAD with aortoiliac occlusion (med rx per [facility]), NSVT noted  in device interrogation who is being seen today for the evaluation of exertional SOB at the request of Cortni Coutoure PA-C.  Assessment & Plan    1. SOB - being treated for possible pneumonia per primary team - also being managed for possibe HF exacerbation, started on IV lasix - today 97% on RA, symptoms improving.   2. Acute on chronic systolic HF - 2563 echo LVEF 35-40%, repeat echo pending - BNP 603 on admission, CXR more consistent with left midlung opacity as opposed to edema - I/Os incomplete yesterday, weights would suggest 6 lbs weight loss but doubt accuracy. Received IV lasix 40mg  x 1 yesterday, due for bid dosing today. Slight uptrend in Cr, would continue IV lasix today and follow trend. Likely d/c diuretic after today.  - f/u repeat echo, continue coreg, losartan for now. May transition to entresto this admission   3. CAD - very mild flat trop not consistent with ACS  4. CKD  5. Anemia  For questions or updates, please contact [place] Please consult [email or web address] for contact info under        Signed, Carlyle Dolly, MD  06/15/2020, 7:55 AM

## 2020-06-15 NOTE — Plan of Care (Signed)
  Problem: Health Behavior/Discharge Planning: Goal: Ability to manage health-related needs will improve Outcome: Progressing   Problem: Clinical Measurements: Goal: Ability to maintain clinical measurements within normal limits will improve Outcome: Progressing Goal: Respiratory complications will improve Outcome: Progressing   Problem: Pain Managment: Goal: General experience of comfort will improve Outcome: Progressing   Problem: Safety: Goal: Ability to remain free from injury will improve Outcome: Progressing   Problem: Activity: Goal: Capacity to carry out activities will improve Outcome: Progressing   Problem: Cardiac: Goal: Ability to achieve and maintain adequate cardiopulmonary perfusion will improve Outcome: Progressing

## 2020-06-15 NOTE — Progress Notes (Signed)
Echocardiogram 2D Echocardiogram has been performed.  Richard Flynn 06/15/2020, 3:12 PM

## 2020-06-16 LAB — BASIC METABOLIC PANEL
Anion gap: 14 (ref 5–15)
BUN: 28 mg/dL — ABNORMAL HIGH (ref 8–23)
CO2: 22 mmol/L (ref 22–32)
Calcium: 9.4 mg/dL (ref 8.9–10.3)
Chloride: 102 mmol/L (ref 98–111)
Creatinine, Ser: 1.42 mg/dL — ABNORMAL HIGH (ref 0.61–1.24)
GFR, Estimated: 54 mL/min — ABNORMAL LOW (ref >60)
Glucose, Bld: 98 mg/dL (ref 70–99)
Potassium: 4.3 mmol/L (ref 3.5–5.1)
Sodium: 138 mmol/L (ref 135–145)

## 2020-06-16 LAB — CBC
HCT: 34.1 % — ABNORMAL LOW (ref 39.0–52.0)
Hemoglobin: 11.1 g/dL — ABNORMAL LOW (ref 13.0–17.0)
MCH: 30 pg (ref 26.0–34.0)
MCHC: 32.6 g/dL (ref 30.0–36.0)
MCV: 92.2 fL (ref 80.0–100.0)
Platelets: 166 10*3/uL (ref 150–400)
RBC: 3.7 MIL/uL — ABNORMAL LOW (ref 4.22–5.81)
RDW: 14.5 % (ref 11.5–15.5)
WBC: 9 10*3/uL (ref 4.0–10.5)
nRBC: 0 % (ref 0.0–0.2)

## 2020-06-16 LAB — GLUCOSE, CAPILLARY
Glucose-Capillary: 119 mg/dL — ABNORMAL HIGH (ref 70–99)
Glucose-Capillary: 276 mg/dL — ABNORMAL HIGH (ref 70–99)
Glucose-Capillary: 114 mg/dL — ABNORMAL HIGH (ref 70–99)
Glucose-Capillary: 157 mg/dL — ABNORMAL HIGH (ref 70–99)

## 2020-06-16 LAB — SURGICAL PCR SCREEN
MRSA, PCR: NEGATIVE
Staphylococcus aureus: NEGATIVE

## 2020-06-16 LAB — PROCALCITONIN: Procalcitonin: <0.1 ng/mL

## 2020-06-16 MED ORDER — MUPIROCIN 2 % EX OINT
1.0000 "application " | TOPICAL_OINTMENT | Freq: Two times a day (BID) | CUTANEOUS | Status: DC
  Filled 2020-06-16: qty 22

## 2020-06-16 MED ORDER — SODIUM CHLORIDE 0.9% FLUSH
3.0000 mL | Freq: Two times a day (BID) | INTRAVENOUS | Status: DC
  Administered 2020-06-16 – 2020-06-17 (×2): 3 mL via INTRAVENOUS

## 2020-06-16 MED ORDER — SODIUM CHLORIDE 0.9 % IV SOLN: 250.0000 mL | INTRAVENOUS | Status: DC | PRN

## 2020-06-16 MED ORDER — SODIUM CHLORIDE 0.9% FLUSH: 3.0000 mL | INTRAVENOUS | Status: DC | PRN

## 2020-06-16 MED ORDER — SACUBITRIL-VALSARTAN 24-26 MG PO TABS
1.0000 | ORAL_TABLET | Freq: Two times a day (BID) | ORAL | Status: DC
  Administered 2020-06-16 – 2020-06-18 (×5): 1 via ORAL
  Filled 2020-06-16 (×5): qty 1

## 2020-06-16 MED ORDER — ASPIRIN 81 MG PO CHEW
81.0000 mg | CHEWABLE_TABLET | ORAL | Status: AC
  Administered 2020-06-17: 81 mg via ORAL
  Filled 2020-06-16: qty 1

## 2020-06-16 MED ORDER — SODIUM CHLORIDE 0.9 % IV SOLN: INTRAVENOUS | Status: DC

## 2020-06-16 MED ORDER — SACUBITRIL-VALSARTAN 24-26 MG PO TABS
1.0000 | ORAL_TABLET | Freq: Two times a day (BID) | ORAL | Status: DC
Start: 2020-06-17 — End: 2020-06-16

## 2020-06-16 NOTE — Plan of Care (Signed)
  Problem: Health Behavior/Discharge Planning: Goal: Ability to manage health-related needs will improve Outcome: Progressing   Problem: Clinical Measurements: Goal: Ability to maintain clinical measurements within normal limits will improve Outcome: Progressing   Problem: Pain Managment: Goal: General experience of comfort will improve Outcome: Progressing   Problem: Nutrition: Goal: Adequate nutrition will be maintained Outcome: Completed/Met   Problem: Elimination: Goal: Will not experience complications related to bowel motility Outcome: Completed/Met Goal: Will not experience complications related to urinary retention Outcome: Completed/Met

## 2020-06-16 NOTE — Progress Notes (Signed)
Subjective:   Hospital day: 2  Overnight event: NAEOV  This AM, patient states he is doing much better. State his SOB has resolved. He denies any CP, dizziness, palpitations, leg pain, abdominal pain or cough. Patient aware of plan for heart cath during hospitalization.   Objective:  Vital signs in last 24 hours: Vitals:   06/15/20 1634 06/15/20 1744 06/15/20 2134 06/16/20 0518  BP: 121/63 115/79 109/74 101/73  Pulse: 64  66 64  Resp: 18  16 17   Temp: 97.9 F (36.6 C)  98 F (36.7 C) 97.9 F (36.6 C)  TempSrc: Oral  Oral Oral  SpO2: 98%  98% 93%  Weight:    76 kg  Height:        Physical Exam  General: Pleasant, well-appearing elderly sitting on chair. No acute distress. Head: Normocephalic. Atraumatic. CV: RRR. No murmurs, rubs, or gallops. No LE edema Pulmonary: Lungs CTAB. Normal effort. Mild bibasilar rales in the bases. No wheezing. Abdominal: Soft, nontender, nondistended. Normal bowel sounds. Extremities: Palpable pulses. Normal ROM. Skin: Warm and dry. No obvious rash or lesions. Neuro: A&Ox3. Moves all extremities. Normal sensation. No focal deficit. Psych: Normal mood and affect  Assessment/Plan: Richard Flynn is a 64 y.o. male w/ PMH of bothischemicandnonischemiccardiomyopathy, HFrEF (EF 35-40% in 2017) s/p ICD in 2014, CAD (last cath 2013 with occluded proximal RCA with L to R collaterals), DM, HTN, HLD, CKD 3, PAD with aortoiliac occlusion who presented with 2 weeks of DOE w/ associated chest tightness and found to have lab and imaging finding concerning for possible pneumonia vs. HFrEF exacerbation. Repeat ECHO shows worsening EF (20-25%). Plan for LHC/RHC during admission. SOB has resolved per patient.  Active Problems:   Dyspnea  Acute on chronic systolic heart failure Patient presented with DOE that progressed to SOB at rest. Found to have BNP of 603 on admission. Repeat ECHO on 12/18 shows decreased EF to 20-25%,grade ll DDx, normal RV which is a  decline from 2017 ECHO showing EF of 35-40%. Diuresing well on IV lasix. Net -1.5 L yesterday. Cr stable. SOB has resolved per patient. Patient is euvolemic on exam. Plan for heart cath in the setting of acute decline in LVSF. --Pending LHC/RHC tomorrow --Holding IV lasix until after cath --Daily I&Os --Daily weights --BMP  Shortness of breath Possible lobar PNA Patient presented with shortness of breath and cough and found to have mild leukocytosis and CXr findings concerning for possible pneumonia. S/p 1 dose of azithromycin and Rocephin. White count now normal and patient has remain afebrile. CTA negative for PE but shows small R pleural effusion and 5 mm solid pulmonary nodule that will require follow up chest CT in 12 months. Procal <10. Less likely to be PNA.  Symptoms likely 2/2 to HF exacerbation as above. Patient's SOB has resolved but does have ocassional cough. Satting well on RA.  --Monitor vitals --Consider antitussives if cough does not resolve --Tele --Repeat chest CT in 12 months  CAD PAD w/ aortoiliac occlusion Previous cath 2013 with occluded proximal RCA with collaterals.  Troponin is flat.  Patient reports chest tightness has resolved.  Call his clinic for heart catheterization in the setting of decreased LV function --Pending RHC/LHC --Continue lipitor 80 mg daily --Continue ASA 81 mg daily  HTN BP stable at 119/72 this morning.  Holding home spironolactone.  Transitioned to [facility] today per cardiology. --Continue home Coreg 6.25 mg BID --Start Entresto 24-26 mg BID --Daily vitals  CKD 3 Normocytic anemia.  Creatinine of 1.42 around baseline creatinine. Hemoglobin stable at 11.1. making appropriate urine.  --Daily BMP --Avoid nephrotoxic effects  IDDM A1c of 6.7 during admission. Morning CBG 119.  --Continue home Lantus 24 units daily at bedtime --SSI, sensitive --CBG monitoring  Diet: HH, NPO at midnight IVF: None VTE: Lovenox 40 mg subcu daily CODE:  Full Code  Prior to Admission Living Arrangement: Home Anticipated Discharge Location: Home Barriers to Discharge: Medical workup Dispo: Anticipated discharge in approximately 1-2 day(s).   Signed: Lacinda Axon, MD 06/16/2020, 6:27 AM  Pager: [phone] Internal Medicine Teaching Service After 5pm on weekdays and 1pm on weekends: On Call pager: [phone]

## 2020-06-16 NOTE — Discharge Summary (Signed)
Name: Richard Flynn MRN: [id number] DOB: Nov 20, 1955 64 y.o. PCP: Finis Bud, MD  Date of Admission: 06/14/2020  3:32 PM Date of Discharge:  06/18/2020 Attending Physician: Dr. Jimmye Norman  Discharge Diagnosis: Active Problems:   Dyspnea   Acute on chronic systolic heart failure [facility])    Discharge Medications: Allergies as of 06/18/2020   No Known Allergies      Medication List     STOP taking these medications    lisinopril 40 MG tablet Commonly known as: ZESTRIL   spironolactone 25 MG tablet Commonly known as: ALDACTONE       TAKE these medications    Airborne Elderberry Chew Chew 1 tablet by mouth at bedtime.   allopurinol 100 MG tablet Commonly known as: ZYLOPRIM Take 100 mg by mouth daily.   aspirin EC 81 MG tablet Take 81 mg by mouth daily. Swallow whole.   atorvastatin 80 MG tablet Commonly known as: LIPITOR Take 1 tablet (80 mg total) by mouth daily.   carvedilol 6.25 MG tablet Commonly known as: COREG Take 6.25 mg by mouth 2 (two) times daily with a meal.   CoQ10 Gummies Adult 50 MG Chew Generic drug: Coenzyme Q10 Chew 1 tablet by mouth at bedtime.   empagliflozin 10 MG Tabs tablet Commonly known as: JARDIANCE Take 1 tablet (10 mg total) by mouth daily.   FISH OIL PO Take 1 capsule by mouth daily.   glipiZIDE 5 MG tablet Commonly known as: GLUCOTROL Take 5 mg by mouth 2 (two) times daily before a meal.   Lantus SoloStar 100 UNIT/ML Solostar Pen Generic drug: insulin glargine Inject 24 Units into the skin at bedtime.   metFORMIN 500 MG 24 hr tablet Commonly known as: GLUCOPHAGE-XR Take 500 mg by mouth 2 (two) times daily.   Multivitamin Adult Chew Chew 1 tablet by mouth at bedtime.   nitroGLYCERIN 0.4 MG SL tablet Commonly known as: NITROSTAT Place under the tongue.   sacubitril-valsartan 24-26 MG Commonly known as: ENTRESTO Take 1 tablet by mouth 2 (two) times daily.   Vitamin D3 125 MCG (5000 UT) Caps Take 1  capsule by mouth daily.        Disposition and follow-up:   RichardRichard Flynn was discharged from [facility] in Stable condition.  At the hospital follow up visit please address:  1.  Follow-up:  A. Acute on chronic HF: Patient was found to have an acute exacerbation of his HF. Symptoms resolved after IV diuresing. Please ask about changes in symptoms since admission and adjust his home lasix dose at your discretion.    B. CAD: Patient had a repeat LHC/RHC with no significant change from previous. He was started on [place].  Please ensure he is adherent with his GDMT and risk factor modification.   C. Contrast nephropathy/CKD3: Patient w/ hx of CKD 3 received contrast during heart cath. Cr was stable at discharge but check a BMP to monitor kidney function.     D. Pulmonary Nodule: Patient was found to have a 5 mm solid pulmonary nodule on CTA. No actions needed at this time but he will need a follow up CT chest in 12 months.   2.  Labs / imaging needed at time of follow-up: BMP  3.  Pending labs/ test needing follow-up: None  Follow-up Appointments:  Cardiology on 07/02/2019 Patient will call PCP for a hospital f/u appt  Hospital Course by problem list: Acute on chronic systolic heart failure Patient presented  with DOE that progressed to SOB at rest. Found to have BNP of 603 and imaging showing small pleural effusion on admission. Repeat ECHO on 06/15/20 showed decreased EF to 20-25%,grade ll DDx, normal RV which is a decline from 2017 ECHO showing EF of 35-40%. He was diuresed to a net of -3.5 L (down 3.4 kg) with IV lasix during admission. Shortness of breath resolved and patient was euvolemic on exam on day of discharge. His lasix dose was resumed with appointment for him to follow up with Cardiology on January 3rd 2022.    Shortness of breath Possible lobar PNA Patient presented with shortness of breath and cough and found to have mild leukocytosis  and CXR findings concerning for possible pneumonia. He was given 1 dose of azithromycin and Rocephin in the ED. Antibiotics were not continued as lab findings and symptoms were more consistent with HF exacerbation as above. CTA was negative for PE but showed a small R pleural effusion and 5 mm solid pulmonary nodule that will require follow up chest CT in 12 months. Patient's SOB and cough were resolved on discharge.    CAD PAD w/ aortoiliac occlusion Previous cath 2013 with occluded proximal RCA with collaterals. Troponin were flat.  Patient reported chest tightness on admission that resolved 2 days into hospitalization. Repeat cath 06/18/19/21 showed stable occlusion of RCA with collaterals, no significant change. Cards recommended medical therapy. He was started on [place]. Continued continue Lipitor 80 mg daily and aspirin 81 mg daily. He will follow up with cardiology in the outpatient.   HTN Patient was normotensive on admission. Held home spironolactone due to elevated potassium on arrival. Transitioned to [facility] by cardiology. Continue home Coreg 6.25 mg BID. BP was stable at 114/70 on day of discharge.    CKD 3 Normocytic anemia.  Creatinine of 1.39 on admission which is around baseline. Hemoglobin stable at 11.6. He was making appropriate urine. Creatine was stable at 1.38 at discharge. He was started on Entresto and had contrast for heart cath so will need BMP checked to assess for possible contrast nephropathy.   IDDM A1c of 6.7 during admission. Blood sugar stable during admission. We continued his home Lantus 24 units and started him on a sliding scale insulin.     Discharge Vitals:   BP 109/72 (BP Location: Left Arm)   Pulse 64   Temp (!) 97.4 F (36.3 C) (Oral)   Resp 16   Ht 5\' 6"  (1.676 m)   Wt 76 kg   SpO2 93%   BMI 27.04 kg/m   Pertinent Labs, Studies, and Procedures:  CBC Latest Ref Rng & Units 06/16/2020 06/15/2020 06/14/2020  WBC 4.0 - 10.5 K/uL  9.0 11.3(H) 13.1(H)  Hemoglobin 13.0 - 17.0 g/dL 11.1(L) 11.0(L) 10.9(L)  Hematocrit 39.0 - 52.0 % 34.1(L) 33.3(L) 34.4(L)  Platelets 150 - 400 K/uL 166 164 177    CMP Latest Ref Rng & Units 06/16/2020 06/15/2020 06/14/2020  Glucose 70 - 99 mg/dL 98 67(L) 187(H)  BUN 8 - 23 mg/dL 28(H) 27(H) 22  Creatinine 0.61 - 1.24 mg/dL 1.42(H) 1.43(H) 1.39(H)  Sodium 135 - 145 mmol/L 138 139 135  Potassium 3.5 - 5.1 mmol/L 4.3 4.2 5.3(H)  Chloride 98 - 111 mmol/L 102 104 106  CO2 22 - 32 mmol/L 22 21(L) 18(L)  Calcium 8.9 - 10.3 mg/dL 9.4 8.9 8.3(L)  Total Protein 6.5 - 8.1 g/dL - - 6.9  Total Bilirubin 0.3 - 1.2 mg/dL - - 1.4(H)  Alkaline  Phos 38 - 126 U/L - - 73  AST 15 - 41 U/L - - 32  ALT 0 - 44 U/L - - 37    DG Chest 1 View  Result Date: 06/14/2020 CLINICAL DATA:  Chest pain, chest tightness with shortness of breath. EXAM: CHEST  1 VIEW.  AP portable semi erect. COMPARISON:  None. FINDINGS: Single lead left chest wall cardiac defibrillator. The heart size and mediastinal contours are within normal limits. Aortic arch calcification. Streaky left mid lung airspace opacity. No pulmonary edema. Trace left pleural effusion. No pneumothorax. No acute osseous abnormality. Multilevel degenerative changes of the spine. IMPRESSION: Streaky left mid lung airspace opacity and trace left pleural effusion suggestive of infection/inflammation. Followup PA and lateral chest X-ray is recommended in 3-4 weeks to ensure resolution and exclude underlying malignancy. Electronically Signed   By: Iven Finn M.D.   On: 06/14/2020 16:56   CT ANGIO CHEST PE W OR WO CONTRAST  Result Date: 06/15/2020 CLINICAL DATA:  Chest pain and dyspnea.  Inpatient. EXAM: CT ANGIOGRAPHY CHEST WITH CONTRAST TECHNIQUE: Multidetector CT imaging of the chest was performed using the standard protocol during bolus administration of intravenous contrast. Multiplanar CT image reconstructions and MIPs were obtained to evaluate the vascular  anatomy. CONTRAST:  61mL OMNIPAQUE IOHEXOL 350 MG/ML SOLN COMPARISON:  Chest radiograph from one day prior. FINDINGS: Cardiovascular: The study is high quality for the evaluation of pulmonary embolism. There are no filling defects in the central, lobar, segmental or subsegmental pulmonary artery branches to suggest acute pulmonary embolism. Atherosclerotic nonaneurysmal thoracic aorta. Normal caliber pulmonary arteries. Mild cardiomegaly. No significant pericardial fluid/thickening. Left anterior descending and left circumflex coronary atherosclerosis. Single lead left subclavian ICD with lead tip in right ventricular apex. Mediastinum/Nodes: No discrete thyroid nodules. Unremarkable esophagus. No pathologically enlarged axillary, mediastinal or hilar lymph nodes. Lungs/Pleura: No pneumothorax. Small dependent right pleural effusion. Small loculated left pleural effusion along the left major fissure. Mild centrilobular and paraseptal emphysema. No acute consolidative airspace disease or lung masses. Subpleural 5 mm solid pulmonary nodule in the posterior aspect of the superior segment left lower lobe (series 6/image 52). No additional significant pulmonary nodules. No central airway stenoses. Scattered small parenchymal bands in the medial right middle lobe, lingula and dependent left lower lobe. Upper abdomen: No acute abnormality. Musculoskeletal: No aggressive appearing focal osseous lesions. Moderate thoracic spondylosis. Review of the MIP images confirms the above findings. IMPRESSION: 1. No pulmonary embolism. 2. Mild cardiomegaly. Two-vessel coronary atherosclerosis. 3. Small dependent right pleural effusion. Small loculated left pleural effusion along the left major fissure. 4. Scattered small parenchymal bands in the mid to lower lungs, compatible with mild scarring versus atelectasis. 5. Subpleural 5 mm solid pulmonary nodule in the posterior aspect of the superior segment left lower lobe. Follow-up chest  CT recommended in 12 months in this high risk patient.This recommendation follows the consensus statement: Guidelines for Management of Incidental Pulmonary Nodules Detected on CT Images: From the Fleischner Society 2017; Radiology 2017; 284:228-243. 6. Aortic Atherosclerosis (ICD10-I70.0) and Emphysema (ICD10-J43.9). Electronically Signed   By: Ilona Sorrel M.D.   On: 06/15/2020 14:40   ECHOCARDIOGRAM COMPLETE  Result Date: 06/15/2020    ECHOCARDIOGRAM REPORT   Patient Name:   Richard Flynn Date of Exam: 06/15/2020 Medical Rec #:  [id number]     Height:       66.0 in Accession #:    [id number]    Weight:       169.0 lb Date of Birth:  Dec 27, 1955     BSA:          1.862 m Patient Age:    59 years      BP:           115/76 mmHg Patient Gender: M             HR:           63 bpm. Exam Location:  Inpatient Procedure: 2D Echo, 3D Echo, Color Doppler and Cardiac Doppler Indications:    R06.9 DOE; I50.9* Heart failure (unspecified)  History:        Patient has no prior history of Echocardiogram examinations.                 CHF, CAD; Risk Factors:Hypertension, Diabetes and Dyslipidemia.  Sonographer:    Raquel Sarna Senior RDCS Referring Phys: [id number] [place]  1. Left ventricular ejection fraction, by estimation, is 20 to 25%. Left ventricular ejection fraction by 3D volume is 25 %. Left ventricular ejection fraction by 2D MOD biplane is 22.6 %. The left ventricle has severely decreased function. The left ventricle demonstrates global hypokinesis. Left ventricular diastolic parameters are consistent with Grade II diastolic dysfunction (pseudonormalization). Elevated left atrial pressure. The E/e' is 69.  2. Right ventricular systolic function is normal. The right ventricular size is normal. Tricuspid regurgitation signal is inadequate for assessing PA pressure.  3. Left atrial size was mild to moderately dilated.  4. The mitral valve is grossly normal. Mild to moderate mitral valve regurgitation. No  evidence of mitral stenosis.  5. The aortic valve is tricuspid. Aortic valve regurgitation is not visualized. No aortic stenosis is present.  6. The inferior vena cava is normal in size with greater than 50% respiratory variability, suggesting right atrial pressure of 3 mmHg. FINDINGS  Left Ventricle: Left ventricular ejection fraction, by estimation, is 20 to 25%. Left ventricular ejection fraction by 2D MOD biplane is 22.6 %. Left ventricular ejection fraction by 3D volume is 25 %. The left ventricle has severely decreased function.  The left ventricle demonstrates global hypokinesis. The left ventricular internal cavity size was normal in size. There is no left ventricular hypertrophy. Left ventricular diastolic parameters are consistent with Grade II diastolic dysfunction (pseudonormalization). Elevated left atrial pressure. The E/e' is 4. Right Ventricle: The right ventricular size is normal. No increase in right ventricular wall thickness. Right ventricular systolic function is normal. Tricuspid regurgitation signal is inadequate for assessing PA pressure. Left Atrium: Left atrial size was mild to moderately dilated. Right Atrium: Right atrial size was normal in size. Pericardium: Trivial pericardial effusion is present. Mitral Valve: The mitral valve is grossly normal. Mild to moderate mitral valve regurgitation. No evidence of mitral valve stenosis. Tricuspid Valve: The tricuspid valve is grossly normal. Tricuspid valve regurgitation is not demonstrated. No evidence of tricuspid stenosis. Aortic Valve: The aortic valve is tricuspid. Aortic valve regurgitation is not visualized. No aortic stenosis is present. Pulmonic Valve: The pulmonic valve was grossly normal. Pulmonic valve regurgitation is not visualized. No evidence of pulmonic stenosis. Aorta: The aortic root and ascending aorta are structurally normal, with no evidence of dilitation. Venous: The inferior vena cava is normal in size with greater than  50% respiratory variability, suggesting right atrial pressure of 3 mmHg. IAS/Shunts: The atrial septum is grossly normal. Additional Comments: A pacer wire is visualized in the right atrium and right ventricle.  LEFT VENTRICLE PLAX 2D  Biplane EF (MOD) LVIDd:         5.70 cm         LV Biplane EF:   Left LVIDs:         5.00 cm                          ventricular LV PW:         1.10 cm                          ejection LV IVS:        1.10 cm                          fraction by LVOT diam:     2.10 cm                          2D MOD LV SV:         62                               biplane is LV SV Index:   33                               22.6 %. LVOT Area:     3.46 cm                                Diastology                                LV e' medial:    3.81 cm/s LV Volumes (MOD)               LV E/e' medial:  29.1 LV vol d, MOD    183.0 ml      LV e' lateral:   7.62 cm/s A2C:                           LV E/e' lateral: 14.6 LV vol d, MOD    257.0 ml A4C: LV vol s, MOD    154.0 ml      3D Volume EF A2C:                           LV 3D EF:    Left LV vol s, MOD    183.0 ml                   ventricular A4C:                                        ejection LV SV MOD A2C:   29.0 ml                    fraction by LV SV MOD A4C:   257.0 ml                   3D volume LV SV MOD  BP:    50.3 ml                    is 25 %.                                 3D Volume EF:                                3D EF:        25 % RIGHT VENTRICLE RV S prime:     10.80 cm/s TAPSE (M-mode): 1.9 cm LEFT ATRIUM              Index       RIGHT ATRIUM           Index LA diam:        4.90 cm  2.63 cm/m  RA Area:     14.00 cm LA Vol (A2C):   108.0 ml 58.01 ml/m RA Volume:   31.80 ml  17.08 ml/m LA Vol (A4C):   74.3 ml  39.91 ml/m LA Biplane Vol: 89.1 ml  47.86 ml/m  AORTIC VALVE LVOT Vmax:   82.90 cm/s LVOT Vmean:  62.300 cm/s LVOT VTI:    0.179 m  AORTA Ao Root diam: 3.20 cm Ao Asc diam:  3.20 cm MITRAL VALVE MV Area  (PHT): 2.52 cm      SHUNTS MV Decel Time: 301 msec      Systemic VTI:  0.18 m MR Peak grad:    65.0 mmHg   Systemic Diam: 2.10 cm MR Mean grad:    36.0 mmHg MR Vmax:         403.00 cm/s MR Vmean:        286.0 cm/s MR PISA:         1.01 cm MR PISA Eff ROA: 10 mm MR PISA Radius:  0.40 cm MV E velocity: 111.00 cm/s MV A velocity: 59.10 cm/s MV E/A ratio:  1.88 Eleonore Chiquito MD Electronically signed by Eleonore Chiquito MD Signature Date/Time: 06/15/2020/3:13:10 PM    Final      Discharge Instructions: Discharge Instructions    Diet - low sodium heart healthy   Complete by: As directed    Increase activity slowly   Complete by: As directed     Richard Flynn,   It was a pleasure taking care of you at [facility] were admitted for shortness of breath and treated for heart failure exacerbation. We are discharging you home now that you are doing better. Please follow the following instructions.   1) Follow-up with cardiology on July 01, 2020 2) Follow up with your PCP in the next few days to check your kidney function 3) Start taking Delene Loll and Jadiance  Take care,  Dr. Linwood Dibbles, MD, MPH  Signed: Lacinda Axon, MD 06/16/2020, 10:32 AM   Pager: [phone]

## 2020-06-16 NOTE — Hospital Course (Addendum)
Acute on chronic systolic heart failure Patient presented with DOE that progressed to SOB at rest. Found to have BNP of 603 and imaging showing small pleural effusion on admission. Repeat ECHO on 06/15/20 showed decreased EF to 20-25%,grade ll DDx, normal RV which is a decline from 2017 ECHO showing EF of 35-40%. He was diuresed to a net of -3.5 L (down 3.4 kg) with IV lasix during admission. Shortness of breath resolved and patient was euvolemic on exam on day of discharge. His lasix dose was resumed with appointment for him to follow up with Cardiology on January 3rd 2022.    Shortness of breath Possible lobar PNA Patient presented with shortness of breath and cough and found to have mild leukocytosis and CXR findings concerning for possible pneumonia. He was given 1 dose of azithromycin and Rocephin in the ED. Antibiotics were not continued as lab findings and symptoms were more consistent with HF exacerbation as above. CTA was negative for PE but showed a small R pleural effusion and 5 mm solid pulmonary nodule that will require follow up chest CT in 12 months. Patient's SOB and cough were resolved on discharge.    CAD PAD w/ aortoiliac occlusion Previous cath 2013 with occluded proximal RCA with collaterals. Troponin were flat.  Patient reported chest tightness on admission that resolved 2 days into hospitalization. Repeat cath 06/18/19/21 showed stable occlusion of RCA with collaterals, no significant change. Cards recommended medical therapy. He was started on [place]. Continued continue Lipitor 80 mg daily and aspirin 81 mg daily. He will follow up with cardiology in the outpatient.   HTN Patient was normotensive on admission. Held home spironolactone due to elevated potassium on arrival. Transitioned to [facility] by cardiology. Continue home Coreg 6.25 mg BID. BP was stable at 114/70 on day of discharge.    CKD 3 Normocytic anemia.  Creatinine of 1.39 on admission which is  around baseline. Hemoglobin stable at 11.6. He was making appropriate urine. Creatine was stable at 1.38 at discharge. He was started on Entresto and had contrast for heart cath so will need BMP checked to assess for possible contrast nephropathy.   IDDM A1c of 6.7 during admission. Blood sugar stable during admission. We continued his home Lantus 24 units and started him on a sliding scale insulin.

## 2020-06-16 NOTE — Progress Notes (Signed)
Progress Note  Patient Name: Richard Flynn Date of Encounter: 06/16/2020  [facility] Cardiologist: Kirk Ruths, MD   Subjective   SOB has resolved per patient.   Inpatient Medications    Scheduled Meds: . allopurinol  100 mg Oral Daily  . aspirin EC  81 mg Oral Daily  . atorvastatin  80 mg Oral Daily  . carvedilol  6.25 mg Oral BID WC  . cholecalciferol  5,000 Units Oral Daily  . enoxaparin (LOVENOX) injection  40 mg Subcutaneous Q24H  . influenza vaccine adjuvanted  0.5 mL Intramuscular Tomorrow-1000  . insulin aspart  0-9 Units Subcutaneous TID WC  . insulin glargine  24 Units Subcutaneous QHS  . losartan  50 mg Oral Daily  . multivitamin with minerals  1 tablet Oral QHS  . omega-3 acid ethyl esters  1 g Oral Daily   Continuous Infusions:  PRN Meds: acetaminophen **OR** acetaminophen, ondansetron **OR** ondansetron (ZOFRAN) IV   Vital Signs    Vitals:   06/15/20 1634 06/15/20 1744 06/15/20 2134 06/16/20 0518  BP: 121/63 115/79 109/74 101/73  Pulse: 64  66 64  Resp: 18  16 17   Temp: 97.9 F (36.6 C)  98 F (36.7 C) 97.9 F (36.6 C)  TempSrc: Oral  Oral Oral  SpO2: 98%  98% 93%  Weight:    76 kg  Height:        Intake/Output Summary (Last 24 hours) at 06/16/2020 0803 Last data filed at 06/16/2020 0522 Gross per 24 hour  Intake 720 ml  Output 2250 ml  Net -1530 ml   Last 3 Weights 06/16/2020 06/15/2020 06/14/2020  Weight (lbs) 167 lb 8 oz 169 lb 175 lb  Weight (kg) 75.978 kg 76.658 kg 79.379 kg      Telemetry    NSR - Personally Reviewed  ECG    n/a - Personally Reviewed  Physical Exam   GEN: No acute distress.   Neck: No JVD Cardiac: RRR, no murmurs, rubs, or gallops.  Respiratory: Clear to auscultation bilaterally. GI: Soft, nontender, non-distended  MS: No edema; No deformity. Neuro:  Nonfocal  Psych: Normal affect   Labs    High Sensitivity Troponin:   Recent Labs  Lab 06/14/20 1547 06/14/20 1823  TROPONINIHS 27* 28*       Chemistry Recent Labs  Lab 06/14/20 1547 06/15/20 0414 06/16/20 0609  NA 135 139 138  K 5.3* 4.2 4.3  CL 106 104 102  CO2 18* 21* 22  GLUCOSE 187* 67* 98  BUN 22 27* 28*  CREATININE 1.39* 1.43* 1.42*  CALCIUM 8.3* 8.9 9.4  PROT 6.9  --   --   ALBUMIN 3.7  --   --   AST 32  --   --   ALT 37  --   --   ALKPHOS 73  --   --   BILITOT 1.4*  --   --   GFRNONAA 56* 54* 54*  ANIONGAP 11 14 14      Hematology Recent Labs  Lab 06/14/20 1547 06/15/20 0414 06/16/20 0609  WBC 13.1* 11.3* 9.0  RBC 3.62* 3.61* 3.70*  HGB 10.9* 11.0* 11.1*  HCT 34.4* 33.3* 34.1*  MCV 95.0 92.2 92.2  MCH 30.1 30.5 30.0  MCHC 31.7 33.0 32.6  RDW 14.7 14.6 14.5  PLT 177 164 166    BNP Recent Labs  Lab 06/14/20 1547  BNP 603.0*     DDimer No results for input(s): DDIMER in the last 168 hours.  Radiology    DG Chest 1 View  Result Date: 06/14/2020 CLINICAL DATA:  Chest pain, chest tightness with shortness of breath. EXAM: CHEST  1 VIEW.  AP portable semi erect. COMPARISON:  None. FINDINGS: Single lead left chest wall cardiac defibrillator. The heart size and mediastinal contours are within normal limits. Aortic arch calcification. Streaky left mid lung airspace opacity. No pulmonary edema. Trace left pleural effusion. No pneumothorax. No acute osseous abnormality. Multilevel degenerative changes of the spine. IMPRESSION: Streaky left mid lung airspace opacity and trace left pleural effusion suggestive of infection/inflammation. Followup PA and lateral chest X-ray is recommended in 3-4 weeks to ensure resolution and exclude underlying malignancy. Electronically Signed   By: Iven Finn M.D.   On: 06/14/2020 16:56   CT ANGIO CHEST PE W OR WO CONTRAST  Result Date: 06/15/2020 CLINICAL DATA:  Chest pain and dyspnea.  Inpatient. EXAM: CT ANGIOGRAPHY CHEST WITH CONTRAST TECHNIQUE: Multidetector CT imaging of the chest was performed using the standard protocol during bolus administration  of intravenous contrast. Multiplanar CT image reconstructions and MIPs were obtained to evaluate the vascular anatomy. CONTRAST:  71mL OMNIPAQUE IOHEXOL 350 MG/ML SOLN COMPARISON:  Chest radiograph from one day prior. FINDINGS: Cardiovascular: The study is high quality for the evaluation of pulmonary embolism. There are no filling defects in the central, lobar, segmental or subsegmental pulmonary artery branches to suggest acute pulmonary embolism. Atherosclerotic nonaneurysmal thoracic aorta. Normal caliber pulmonary arteries. Mild cardiomegaly. No significant pericardial fluid/thickening. Left anterior descending and left circumflex coronary atherosclerosis. Single lead left subclavian ICD with lead tip in right ventricular apex. Mediastinum/Nodes: No discrete thyroid nodules. Unremarkable esophagus. No pathologically enlarged axillary, mediastinal or hilar lymph nodes. Lungs/Pleura: No pneumothorax. Small dependent right pleural effusion. Small loculated left pleural effusion along the left major fissure. Mild centrilobular and paraseptal emphysema. No acute consolidative airspace disease or lung masses. Subpleural 5 mm solid pulmonary nodule in the posterior aspect of the superior segment left lower lobe (series 6/image 52). No additional significant pulmonary nodules. No central airway stenoses. Scattered small parenchymal bands in the medial right middle lobe, lingula and dependent left lower lobe. Upper abdomen: No acute abnormality. Musculoskeletal: No aggressive appearing focal osseous lesions. Moderate thoracic spondylosis. Review of the MIP images confirms the above findings. IMPRESSION: 1. No pulmonary embolism. 2. Mild cardiomegaly. Two-vessel coronary atherosclerosis. 3. Small dependent right pleural effusion. Small loculated left pleural effusion along the left major fissure. 4. Scattered small parenchymal bands in the mid to lower lungs, compatible with mild scarring versus atelectasis. 5. Subpleural  5 mm solid pulmonary nodule in the posterior aspect of the superior segment left lower lobe. Follow-up chest CT recommended in 12 months in this high risk patient.This recommendation follows the consensus statement: Guidelines for Management of Incidental Pulmonary Nodules Detected on CT Images: From the Fleischner Society 2017; Radiology 2017; 284:228-243. 6. Aortic Atherosclerosis (ICD10-I70.0) and Emphysema (ICD10-J43.9). Electronically Signed   By: Ilona Sorrel M.D.   On: 06/15/2020 14:40   ECHOCARDIOGRAM COMPLETE  Result Date: 06/15/2020    ECHOCARDIOGRAM REPORT   Patient Name:   Richard Flynn Date of Exam: 06/15/2020 Medical Rec #:  [id number]     Height:       66.0 in Accession #:    [id number]    Weight:       169.0 lb Date of Birth:  01-19-1956     BSA:          1.862 m Patient Age:    56 years  BP:           115/76 mmHg Patient Gender: M             HR:           63 bpm. Exam Location:  Inpatient Procedure: 2D Echo, 3D Echo, Color Doppler and Cardiac Doppler Indications:    R06.9 DOE; I50.9* Heart failure (unspecified)  History:        Patient has no prior history of Echocardiogram examinations.                 CHF, CAD; Risk Factors:Hypertension, Diabetes and Dyslipidemia.  Sonographer:    Raquel Sarna Senior RDCS Referring Phys: [id number] [place]  1. Left ventricular ejection fraction, by estimation, is 20 to 25%. Left ventricular ejection fraction by 3D volume is 25 %. Left ventricular ejection fraction by 2D MOD biplane is 22.6 %. The left ventricle has severely decreased function. The left ventricle demonstrates global hypokinesis. Left ventricular diastolic parameters are consistent with Grade II diastolic dysfunction (pseudonormalization). Elevated left atrial pressure. The E/e' is 65.  2. Right ventricular systolic function is normal. The right ventricular size is normal. Tricuspid regurgitation signal is inadequate for assessing PA pressure.  3. Left atrial size was mild to  moderately dilated.  4. The mitral valve is grossly normal. Mild to moderate mitral valve regurgitation. No evidence of mitral stenosis.  5. The aortic valve is tricuspid. Aortic valve regurgitation is not visualized. No aortic stenosis is present.  6. The inferior vena cava is normal in size with greater than 50% respiratory variability, suggesting right atrial pressure of 3 mmHg. FINDINGS  Left Ventricle: Left ventricular ejection fraction, by estimation, is 20 to 25%. Left ventricular ejection fraction by 2D MOD biplane is 22.6 %. Left ventricular ejection fraction by 3D volume is 25 %. The left ventricle has severely decreased function.  The left ventricle demonstrates global hypokinesis. The left ventricular internal cavity size was normal in size. There is no left ventricular hypertrophy. Left ventricular diastolic parameters are consistent with Grade II diastolic dysfunction (pseudonormalization). Elevated left atrial pressure. The E/e' is 18. Right Ventricle: The right ventricular size is normal. No increase in right ventricular wall thickness. Right ventricular systolic function is normal. Tricuspid regurgitation signal is inadequate for assessing PA pressure. Left Atrium: Left atrial size was mild to moderately dilated. Right Atrium: Right atrial size was normal in size. Pericardium: Trivial pericardial effusion is present. Mitral Valve: The mitral valve is grossly normal. Mild to moderate mitral valve regurgitation. No evidence of mitral valve stenosis. Tricuspid Valve: The tricuspid valve is grossly normal. Tricuspid valve regurgitation is not demonstrated. No evidence of tricuspid stenosis. Aortic Valve: The aortic valve is tricuspid. Aortic valve regurgitation is not visualized. No aortic stenosis is present. Pulmonic Valve: The pulmonic valve was grossly normal. Pulmonic valve regurgitation is not visualized. No evidence of pulmonic stenosis. Aorta: The aortic root and ascending aorta are structurally  normal, with no evidence of dilitation. Venous: The inferior vena cava is normal in size with greater than 50% respiratory variability, suggesting right atrial pressure of 3 mmHg. IAS/Shunts: The atrial septum is grossly normal. Additional Comments: A pacer wire is visualized in the right atrium and right ventricle.  LEFT VENTRICLE PLAX 2D                        Biplane EF (MOD) LVIDd:         5.70 cm  LV Biplane EF:   Left LVIDs:         5.00 cm                          ventricular LV PW:         1.10 cm                          ejection LV IVS:        1.10 cm                          fraction by LVOT diam:     2.10 cm                          2D MOD LV SV:         62                               biplane is LV SV Index:   33                               22.6 %. LVOT Area:     3.46 cm                                Diastology                                LV e' medial:    3.81 cm/s LV Volumes (MOD)               LV E/e' medial:  29.1 LV vol d, MOD    183.0 ml      LV e' lateral:   7.62 cm/s A2C:                           LV E/e' lateral: 14.6 LV vol d, MOD    257.0 ml A4C: LV vol s, MOD    154.0 ml      3D Volume EF A2C:                           LV 3D EF:    Left LV vol s, MOD    183.0 ml                   ventricular A4C:                                        ejection LV SV MOD A2C:   29.0 ml                    fraction by LV SV MOD A4C:   257.0 ml                   3D volume LV SV MOD BP:    50.3 ml  is 25 %.                                 3D Volume EF:                                3D EF:        25 % RIGHT VENTRICLE RV S prime:     10.80 cm/s TAPSE (M-mode): 1.9 cm LEFT ATRIUM              Index       RIGHT ATRIUM           Index LA diam:        4.90 cm  2.63 cm/m  RA Area:     14.00 cm LA Vol (A2C):   108.0 ml 58.01 ml/m RA Volume:   31.80 ml  17.08 ml/m LA Vol (A4C):   74.3 ml  39.91 ml/m LA Biplane Vol: 89.1 ml  47.86 ml/m  AORTIC VALVE LVOT Vmax:   82.90 cm/s LVOT Vmean:   62.300 cm/s LVOT VTI:    0.179 m  AORTA Ao Root diam: 3.20 cm Ao Asc diam:  3.20 cm MITRAL VALVE MV Area (PHT): 2.52 cm      SHUNTS MV Decel Time: 301 msec      Systemic VTI:  0.18 m MR Peak grad:    65.0 mmHg   Systemic Diam: 2.10 cm MR Mean grad:    36.0 mmHg MR Vmax:         403.00 cm/s MR Vmean:        286.0 cm/s MR PISA:         1.01 cm MR PISA Eff ROA: 10 mm MR PISA Radius:  0.40 cm MV E velocity: 111.00 cm/s MV A velocity: 59.10 cm/s MV E/A ratio:  1.88 Eleonore Chiquito MD Electronically signed by Eleonore Chiquito MD Signature Date/Time: 06/15/2020/3:13:10 PM    Final     Cardiac Studies     Patient Profile     Richard Fullam Nelsonis a 65 y.o.malewith a hx of longstanding cardiomyopathy diagnosed in 2013 (last EF 35-40% in 9470), chronic systolic CHF, [employer] ICD implanted 2014, CAD with occluded RCA with collaterals collaterals by cath , DM, HTN, HLD, CKD stage III, pancreatitis, PAD with aortoiliac occlusion (med rx per [facility]), NSVT noted in device interrogationwho is being seen today for the evaluation of exertional SOBat the request of Cortni Coutoure PA-C.  Assessment & Plan    1. SOB - being treated for possible pneumonia initially - also being managed for possibe HF exacerbation, started on IV lasix   2. Acute on chronic systolic HF - 9628 echo LVEF 35-40%, repeat echo pending 06/15/20 echo: LVEF 20-25%, grade II DDx, normal RV - BNP 603 on admission, CXR more consistent with left midlung opacity as opposed to edema  - Neg 1.5 L yesterday, total I/Os incomplete this admission. Weights would suggest 8 lbs weight loss if accurate. He received IV lasix 40mg  x 1 yesterday. Mild uptrend in Cr overall this admit to 1.4 though within prior values. Symptoms much improved today  - medical therapy with coreg 6.25mg  bid, losartan 50mg  daily.Change losartan to entresto 24/26mg  bid, monitor bp's  - with his worsening HF symptoms and drop in LVEF plan for RHC/LHC this  admission - hold diuretic until after cath, f/u  filling pressures.      3. CAD - prior cath occluded RCA with collaterals - very mild flat trop not consistent with ACS - from reviewing admission notes somewhat variable on degree of chest pain patient reported - to me he reports primary symptoms was SOB. Started as DOE but progressed to SOB at rest, with exertion he would get 2/10 chest tightness along with the SOB.   - given symptoms along with signficant further decline in LV function would plan for RHC/LHC  4. CKD    5. PAD with aortoiliac occlusion (med rx per [facility]   I have reviewed the risks, indications, and alternatives to cardiac catheterization, possible angioplasty, and stenting with the patient  today. Risks include but are not limited to bleeding, infection, vascular injury, stroke, myocardial infection, arrhythmia, kidney injury, radiation-related injury in the case of prolonged fluoroscopy use, emergency cardiac surgery, and death. The patient understands the risks of serious complication is 1-2 in 9622 with diagnostic cardiac cath and 1-2% or less with angioplasty/stenting.   For questions or updates, please contact [place] Please consult [email or web address] for contact info under        Signed, Carlyle Dolly, MD  06/16/2020, 8:03 AM

## 2020-06-17 ENCOUNTER — Encounter (HOSPITAL_COMMUNITY)
Admission: EM | Disposition: A | Discharge status: Home / Self Care | Payer: Self-pay | Source: Home / Self Care | Attending: Internal Medicine

## 2020-06-17 ENCOUNTER — Other Ambulatory Visit: Payer: Self-pay

## 2020-06-17 ENCOUNTER — Telehealth: Payer: Self-pay | Admitting: Cardiology

## 2020-06-17 DIAGNOSIS — I5023 Acute on chronic systolic (congestive) heart failure: Secondary | ICD-10-CM

## 2020-06-17 DIAGNOSIS — I2510 Atherosclerotic heart disease of native coronary artery without angina pectoris: Secondary | ICD-10-CM

## 2020-06-17 HISTORY — PX: LEFT HEART CATH AND CORONARY ANGIOGRAPHY: CATH118249

## 2020-06-17 HISTORY — PX: RIGHT HEART CATH: CATH118263

## 2020-06-17 LAB — POCT I-STAT 7, (LYTES, BLD GAS, ICA,H+H)
Acid-Base Excess: 0 mmol/L (ref 0.0–2.0)
Bicarbonate: 23.7 mmol/L (ref 20.0–28.0)
Calcium, Ion: 1.3 mmol/L (ref 1.15–1.40)
HCT: 36 % — ABNORMAL LOW (ref 39.0–52.0)
Hemoglobin: 12.2 g/dL — ABNORMAL LOW (ref 13.0–17.0)
O2 Saturation: 98 %
Potassium: 4.2 mmol/L (ref 3.5–5.1)
Sodium: 140 mmol/L (ref 135–145)
TCO2: 25 mmol/L (ref 22–32)
pCO2 arterial: 36.1 mmHg (ref 32.0–48.0)
pH, Arterial: 7.424 (ref 7.350–7.450)
pO2, Arterial: 96 mmHg (ref 83.0–108.0)

## 2020-06-17 LAB — BASIC METABOLIC PANEL
Anion gap: 12 (ref 5–15)
BUN: 24 mg/dL — ABNORMAL HIGH (ref 8–23)
CO2: 23 mmol/L (ref 22–32)
Calcium: 9.3 mg/dL (ref 8.9–10.3)
Chloride: 103 mmol/L (ref 98–111)
Creatinine, Ser: 1.32 mg/dL — ABNORMAL HIGH (ref 0.61–1.24)
GFR, Estimated: 59 mL/min — ABNORMAL LOW (ref >60)
Glucose, Bld: 80 mg/dL (ref 70–99)
Potassium: 3.9 mmol/L (ref 3.5–5.1)
Sodium: 138 mmol/L (ref 135–145)

## 2020-06-17 LAB — PROCALCITONIN: Procalcitonin: <0.1 ng/mL

## 2020-06-17 LAB — POCT I-STAT EG7
Acid-Base Excess: 0 mmol/L (ref 0.0–2.0)
Bicarbonate: 25.7 mmol/L (ref 20.0–28.0)
Calcium, Ion: 1.31 mmol/L (ref 1.15–1.40)
HCT: 35 % — ABNORMAL LOW (ref 39.0–52.0)
Hemoglobin: 11.9 g/dL — ABNORMAL LOW (ref 13.0–17.0)
O2 Saturation: 58 %
Potassium: 4.2 mmol/L (ref 3.5–5.1)
Sodium: 141 mmol/L (ref 135–145)
TCO2: 27 mmol/L (ref 22–32)
pCO2, Ven: 42.9 mmHg — ABNORMAL LOW (ref 44.0–60.0)
pH, Ven: 7.385 (ref 7.250–7.430)
pO2, Ven: 31 mmHg — CL (ref 32.0–45.0)

## 2020-06-17 LAB — GLUCOSE, CAPILLARY
Glucose-Capillary: 87 mg/dL (ref 70–99)
Glucose-Capillary: 96 mg/dL (ref 70–99)
Glucose-Capillary: 140 mg/dL — ABNORMAL HIGH (ref 70–99)
Glucose-Capillary: 161 mg/dL — ABNORMAL HIGH (ref 70–99)

## 2020-06-17 SURGERY — LEFT HEART CATH AND CORONARY ANGIOGRAPHY
Anesthesia: LOCAL

## 2020-06-17 MED ORDER — HEPARIN (PORCINE) IN NACL 1000-0.9 UT/500ML-% IV SOLN
INTRAVENOUS | Status: DC | PRN
  Administered 2020-06-17 (×2): 500 mL

## 2020-06-17 MED ORDER — MIDAZOLAM HCL 2 MG/2ML IJ SOLN
INTRAMUSCULAR | Status: AC
  Filled 2020-06-17: qty 2

## 2020-06-17 MED ORDER — EMPAGLIFLOZIN 10 MG PO TABS
10.0000 mg | ORAL_TABLET | Freq: Every day | ORAL | Status: DC
  Administered 2020-06-17 – 2020-06-18 (×2): 10 mg via ORAL
  Filled 2020-06-17 (×2): qty 1

## 2020-06-17 MED ORDER — SODIUM CHLORIDE 0.9% FLUSH
3.0000 mL | Freq: Two times a day (BID) | INTRAVENOUS | Status: DC
  Administered 2020-06-17: 3 mL via INTRAVENOUS

## 2020-06-17 MED ORDER — HEPARIN SODIUM (PORCINE) 1000 UNIT/ML IJ SOLN
INTRAMUSCULAR | Status: DC | PRN
  Administered 2020-06-17: 4000 [IU] via INTRAVENOUS

## 2020-06-17 MED ORDER — SODIUM CHLORIDE 0.9% FLUSH: 3.0000 mL | INTRAVENOUS | Status: DC | PRN

## 2020-06-17 MED ORDER — MIDAZOLAM HCL 2 MG/2ML IJ SOLN
INTRAMUSCULAR | Status: DC | PRN
  Administered 2020-06-17: 1 mg via INTRAVENOUS

## 2020-06-17 MED ORDER — HYDRALAZINE HCL 20 MG/ML IJ SOLN: 10.0000 mg | INTRAMUSCULAR | Status: AC | PRN

## 2020-06-17 MED ORDER — VERAPAMIL HCL 2.5 MG/ML IV SOLN
INTRAVENOUS | Status: AC
  Filled 2020-06-17: qty 2

## 2020-06-17 MED ORDER — LIDOCAINE HCL (PF) 1 % IJ SOLN
INTRAMUSCULAR | Status: DC | PRN
  Administered 2020-06-17 (×2): 2 mL

## 2020-06-17 MED ORDER — LABETALOL HCL 5 MG/ML IV SOLN: 10.0000 mg | INTRAVENOUS | Status: AC | PRN

## 2020-06-17 MED ORDER — VERAPAMIL HCL 2.5 MG/ML IV SOLN
INTRAVENOUS | Status: DC | PRN
  Administered 2020-06-17: 10 mL via INTRA_ARTERIAL

## 2020-06-17 MED ORDER — FENTANYL CITRATE (PF) 100 MCG/2ML IJ SOLN
INTRAMUSCULAR | Status: AC
  Filled 2020-06-17: qty 2

## 2020-06-17 MED ORDER — LIDOCAINE HCL (PF) 1 % IJ SOLN
INTRAMUSCULAR | Status: AC
  Filled 2020-06-17: qty 30

## 2020-06-17 MED ORDER — FENTANYL CITRATE (PF) 100 MCG/2ML IJ SOLN
INTRAMUSCULAR | Status: DC | PRN
  Administered 2020-06-17: 25 ug via INTRAVENOUS

## 2020-06-17 MED ORDER — HEPARIN SODIUM (PORCINE) 1000 UNIT/ML IJ SOLN
INTRAMUSCULAR | Status: AC
  Filled 2020-06-17: qty 1

## 2020-06-17 MED ORDER — ACETAMINOPHEN 325 MG PO TABS: 650.0000 mg | ORAL_TABLET | ORAL | Status: DC | PRN

## 2020-06-17 MED ORDER — HEPARIN (PORCINE) IN NACL 1000-0.9 UT/500ML-% IV SOLN
INTRAVENOUS | Status: AC
  Filled 2020-06-17: qty 1000

## 2020-06-17 MED ORDER — ONDANSETRON HCL 4 MG/2ML IJ SOLN: 4.0000 mg | Freq: Four times a day (QID) | INTRAMUSCULAR | Status: DC | PRN

## 2020-06-17 MED ORDER — IOHEXOL 350 MG/ML SOLN
INTRAVENOUS | Status: DC | PRN
  Administered 2020-06-17: 45 mL via INTRA_ARTERIAL

## 2020-06-17 MED ORDER — SODIUM CHLORIDE 0.9 % IV SOLN: 250.0000 mL | INTRAVENOUS | Status: DC | PRN

## 2020-06-17 SURGICAL SUPPLY — 12 items
CATH 5FR JL3.5 JR4 ANG PIG MP (CATHETERS) ×1 IMPLANT
CATH BALLN WEDGE 5F 110CM (CATHETERS) ×1 IMPLANT
DEVICE RAD COMP TR BAND LRG (VASCULAR PRODUCTS) ×1 IMPLANT
GLIDESHEATH SLEND SS 6F .021 (SHEATH) ×1 IMPLANT
GUIDEWIRE INQWIRE 1.5J.035X260 (WIRE) IMPLANT
INQWIRE 1.5J .035X260CM (WIRE) ×2
KIT HEART LEFT (KITS) ×2 IMPLANT
PACK CARDIAC CATHETERIZATION (CUSTOM PROCEDURE TRAY) ×2 IMPLANT
SHEATH GLIDE SLENDER 4/5FR (SHEATH) ×1 IMPLANT
SHEATH PROBE COVER 6X72 (BAG) ×1 IMPLANT
TRANSDUCER W/STOPCOCK (MISCELLANEOUS) ×2 IMPLANT
TUBING CIL FLEX 10 FLL-RA (TUBING) ×2 IMPLANT

## 2020-06-17 NOTE — H&P (View-Only) (Signed)
Progress Note  Patient Name: Richard Flynn Date of Encounter: 06/17/2020  Primary Cardiologist: Kirk Ruths, MD   Subjective   Feels well overall, but has longstanding fatigue.  Inpatient Medications    Scheduled Meds: . allopurinol  100 mg Oral Daily  . aspirin EC  81 mg Oral Daily  . atorvastatin  80 mg Oral Daily  . carvedilol  6.25 mg Oral BID WC  . cholecalciferol  5,000 Units Oral Daily  . enoxaparin (LOVENOX) injection  40 mg Subcutaneous Q24H  . influenza vaccine adjuvanted  0.5 mL Intramuscular Tomorrow-1000  . insulin aspart  0-9 Units Subcutaneous TID WC  . insulin glargine  24 Units Subcutaneous QHS  . multivitamin with minerals  1 tablet Oral QHS  . omega-3 acid ethyl esters  1 g Oral Daily  . sacubitril-valsartan  1 tablet Oral BID  . sodium chloride flush  3 mL Intravenous Q12H   Continuous Infusions: . sodium chloride    . sodium chloride     PRN Meds: sodium chloride, acetaminophen **OR** acetaminophen, ondansetron **OR** ondansetron (ZOFRAN) IV, sodium chloride flush   Vital Signs    Vitals:   06/16/20 1703 06/16/20 2010 06/17/20 0410 06/17/20 0812  BP: 116/72  102/63 111/61  Pulse: 60 62 65 (!) 56  Resp: 18 16 19 17   Temp:  97.9 F (36.6 C) 97.6 F (36.4 C) 97.7 F (36.5 C)  TempSrc:  Oral Oral Oral  SpO2:  97% 98%   Weight:   76.7 kg   Height:        Intake/Output Summary (Last 24 hours) at 06/17/2020 1048 Last data filed at 06/17/2020 0819 Gross per 24 hour  Intake 620 ml  Output 1400 ml  Net -780 ml   Filed Weights   06/15/20 0350 06/16/20 0518 06/17/20 0410  Weight: 76.7 kg 76 kg 76.7 kg    Telemetry    SR - Personally Reviewed  ECG    SR, T wave abnl laterally - Personally Reviewed  Physical Exam   GEN: No acute distress.   Neck: No JVD Cardiac: regular rhythm, normal rate, no murmurs, rubs, or gallops.  Respiratory: Clear to auscultation bilaterally. GI: Soft, nontender, non-distended  MS: No edema; No  deformity. Neuro:  Nonfocal  Psych: Normal affect   Labs    Chemistry Recent Labs  Lab 06/14/20 1547 06/15/20 0414 06/16/20 0609 06/17/20 0555  NA 135 139 138 138  K 5.3* 4.2 4.3 3.9  CL 106 104 102 103  CO2 18* 21* 22 23  GLUCOSE 187* 67* 98 80  BUN 22 27* 28* 24*  CREATININE 1.39* 1.43* 1.42* 1.32*  CALCIUM 8.3* 8.9 9.4 9.3  PROT 6.9  --   --   --   ALBUMIN 3.7  --   --   --   AST 32  --   --   --   ALT 37  --   --   --   ALKPHOS 73  --   --   --   BILITOT 1.4*  --   --   --   GFRNONAA 56* 54* 54* 59*  ANIONGAP 11 14 14 12      Hematology Recent Labs  Lab 06/14/20 1547 06/15/20 0414 06/16/20 0609  WBC 13.1* 11.3* 9.0  RBC 3.62* 3.61* 3.70*  HGB 10.9* 11.0* 11.1*  HCT 34.4* 33.3* 34.1*  MCV 95.0 92.2 92.2  MCH 30.1 30.5 30.0  MCHC 31.7 33.0 32.6  RDW 14.7 14.6 14.5  PLT  177 164 166    Cardiac EnzymesNo results for input(s): TROPONINI in the last 168 hours. No results for input(s): TROPIPOC in the last 168 hours.   BNP Recent Labs  Lab 06/14/20 1547  BNP 603.0*     DDimer No results for input(s): DDIMER in the last 168 hours.   Radiology    CT ANGIO CHEST PE W OR WO CONTRAST  Result Date: 06/15/2020 CLINICAL DATA:  Chest pain and dyspnea.  Inpatient. EXAM: CT ANGIOGRAPHY CHEST WITH CONTRAST TECHNIQUE: Multidetector CT imaging of the chest was performed using the standard protocol during bolus administration of intravenous contrast. Multiplanar CT image reconstructions and MIPs were obtained to evaluate the vascular anatomy. CONTRAST:  33mL OMNIPAQUE IOHEXOL 350 MG/ML SOLN COMPARISON:  Chest radiograph from one day prior. FINDINGS: Cardiovascular: The study is high quality for the evaluation of pulmonary embolism. There are no filling defects in the central, lobar, segmental or subsegmental pulmonary artery branches to suggest acute pulmonary embolism. Atherosclerotic nonaneurysmal thoracic aorta. Normal caliber pulmonary arteries. Mild cardiomegaly. No  significant pericardial fluid/thickening. Left anterior descending and left circumflex coronary atherosclerosis. Single lead left subclavian ICD with lead tip in right ventricular apex. Mediastinum/Nodes: No discrete thyroid nodules. Unremarkable esophagus. No pathologically enlarged axillary, mediastinal or hilar lymph nodes. Lungs/Pleura: No pneumothorax. Small dependent right pleural effusion. Small loculated left pleural effusion along the left major fissure. Mild centrilobular and paraseptal emphysema. No acute consolidative airspace disease or lung masses. Subpleural 5 mm solid pulmonary nodule in the posterior aspect of the superior segment left lower lobe (series 6/image 52). No additional significant pulmonary nodules. No central airway stenoses. Scattered small parenchymal bands in the medial right middle lobe, lingula and dependent left lower lobe. Upper abdomen: No acute abnormality. Musculoskeletal: No aggressive appearing focal osseous lesions. Moderate thoracic spondylosis. Review of the MIP images confirms the above findings. IMPRESSION: 1. No pulmonary embolism. 2. Mild cardiomegaly. Two-vessel coronary atherosclerosis. 3. Small dependent right pleural effusion. Small loculated left pleural effusion along the left major fissure. 4. Scattered small parenchymal bands in the mid to lower lungs, compatible with mild scarring versus atelectasis. 5. Subpleural 5 mm solid pulmonary nodule in the posterior aspect of the superior segment left lower lobe. Follow-up chest CT recommended in 12 months in this high risk patient.This recommendation follows the consensus statement: Guidelines for Management of Incidental Pulmonary Nodules Detected on CT Images: From the Fleischner Society 2017; Radiology 2017; 284:228-243. 6. Aortic Atherosclerosis (ICD10-I70.0) and Emphysema (ICD10-J43.9). Electronically Signed   By: Ilona Sorrel M.D.   On: 06/15/2020 14:40   ECHOCARDIOGRAM COMPLETE  Result Date: 06/15/2020     ECHOCARDIOGRAM REPORT   Patient Name:   Richard Flynn Date of Exam: 06/15/2020 Medical Rec #:  [id number]     Height:       66.0 in Accession #:    [id number]    Weight:       169.0 lb Date of Birth:  02/07/1956     BSA:          1.862 m Patient Age:    1 years      BP:           115/76 mmHg Patient Gender: M             HR:           63 bpm. Exam Location:  Inpatient Procedure: 2D Echo, 3D Echo, Color Doppler and Cardiac Doppler Indications:    R06.9 DOE; I50.9* Heart failure (unspecified)  History:        Patient has no prior history of Echocardiogram examinations.                 CHF, CAD; Risk Factors:Hypertension, Diabetes and Dyslipidemia.  Sonographer:    Raquel Sarna Senior RDCS Referring Phys: [id number] [place]  1. Left ventricular ejection fraction, by estimation, is 20 to 25%. Left ventricular ejection fraction by 3D volume is 25 %. Left ventricular ejection fraction by 2D MOD biplane is 22.6 %. The left ventricle has severely decreased function. The left ventricle demonstrates global hypokinesis. Left ventricular diastolic parameters are consistent with Grade II diastolic dysfunction (pseudonormalization). Elevated left atrial pressure. The E/e' is 74.  2. Right ventricular systolic function is normal. The right ventricular size is normal. Tricuspid regurgitation signal is inadequate for assessing PA pressure.  3. Left atrial size was mild to moderately dilated.  4. The mitral valve is grossly normal. Mild to moderate mitral valve regurgitation. No evidence of mitral stenosis.  5. The aortic valve is tricuspid. Aortic valve regurgitation is not visualized. No aortic stenosis is present.  6. The inferior vena cava is normal in size with greater than 50% respiratory variability, suggesting right atrial pressure of 3 mmHg. FINDINGS  Left Ventricle: Left ventricular ejection fraction, by estimation, is 20 to 25%. Left ventricular ejection fraction by 2D MOD biplane is 22.6 %. Left ventricular  ejection fraction by 3D volume is 25 %. The left ventricle has severely decreased function.  The left ventricle demonstrates global hypokinesis. The left ventricular internal cavity size was normal in size. There is no left ventricular hypertrophy. Left ventricular diastolic parameters are consistent with Grade II diastolic dysfunction (pseudonormalization). Elevated left atrial pressure. The E/e' is 27. Right Ventricle: The right ventricular size is normal. No increase in right ventricular wall thickness. Right ventricular systolic function is normal. Tricuspid regurgitation signal is inadequate for assessing PA pressure. Left Atrium: Left atrial size was mild to moderately dilated. Right Atrium: Right atrial size was normal in size. Pericardium: Trivial pericardial effusion is present. Mitral Valve: The mitral valve is grossly normal. Mild to moderate mitral valve regurgitation. No evidence of mitral valve stenosis. Tricuspid Valve: The tricuspid valve is grossly normal. Tricuspid valve regurgitation is not demonstrated. No evidence of tricuspid stenosis. Aortic Valve: The aortic valve is tricuspid. Aortic valve regurgitation is not visualized. No aortic stenosis is present. Pulmonic Valve: The pulmonic valve was grossly normal. Pulmonic valve regurgitation is not visualized. No evidence of pulmonic stenosis. Aorta: The aortic root and ascending aorta are structurally normal, with no evidence of dilitation. Venous: The inferior vena cava is normal in size with greater than 50% respiratory variability, suggesting right atrial pressure of 3 mmHg. IAS/Shunts: The atrial septum is grossly normal. Additional Comments: A pacer wire is visualized in the right atrium and right ventricle.  LEFT VENTRICLE PLAX 2D                        Biplane EF (MOD) LVIDd:         5.70 cm         LV Biplane EF:   Left LVIDs:         5.00 cm                          ventricular LV PW:         1.10 cm  ejection LV  IVS:        1.10 cm                          fraction by LVOT diam:     2.10 cm                          2D MOD LV SV:         62                               biplane is LV SV Index:   33                               22.6 %. LVOT Area:     3.46 cm                                Diastology                                LV e' medial:    3.81 cm/s LV Volumes (MOD)               LV E/e' medial:  29.1 LV vol d, MOD    183.0 ml      LV e' lateral:   7.62 cm/s A2C:                           LV E/e' lateral: 14.6 LV vol d, MOD    257.0 ml A4C: LV vol s, MOD    154.0 ml      3D Volume EF A2C:                           LV 3D EF:    Left LV vol s, MOD    183.0 ml                   ventricular A4C:                                        ejection LV SV MOD A2C:   29.0 ml                    fraction by LV SV MOD A4C:   257.0 ml                   3D volume LV SV MOD BP:    50.3 ml                    is 25 %.                                 3D Volume EF:                                3D EF:  25 % RIGHT VENTRICLE RV S prime:     10.80 cm/s TAPSE (M-mode): 1.9 cm LEFT ATRIUM              Index       RIGHT ATRIUM           Index LA diam:        4.90 cm  2.63 cm/m  RA Area:     14.00 cm LA Vol (A2C):   108.0 ml 58.01 ml/m RA Volume:   31.80 ml  17.08 ml/m LA Vol (A4C):   74.3 ml  39.91 ml/m LA Biplane Vol: 89.1 ml  47.86 ml/m  AORTIC VALVE LVOT Vmax:   82.90 cm/s LVOT Vmean:  62.300 cm/s LVOT VTI:    0.179 m  AORTA Ao Root diam: 3.20 cm Ao Asc diam:  3.20 cm MITRAL VALVE MV Area (PHT): 2.52 cm      SHUNTS MV Decel Time: 301 msec      Systemic VTI:  0.18 m MR Peak grad:    65.0 mmHg   Systemic Diam: 2.10 cm MR Mean grad:    36.0 mmHg MR Vmax:         403.00 cm/s MR Vmean:        286.0 cm/s MR PISA:         1.01 cm MR PISA Eff ROA: 10 mm MR PISA Radius:  0.40 cm MV E velocity: 111.00 cm/s MV A velocity: 59.10 cm/s MV E/A ratio:  1.88 Eleonore Chiquito MD Electronically signed by Eleonore Chiquito MD Signature Date/Time:  06/15/2020/3:13:10 PM    Final     Cardiac Studies   Cath pending Echo with LVEF 20-25%, G2DD, E/e' 29, mild-mod MR.   Patient Profile     Richard Flynn is a 64 y.o. male with a hx of longstanding cardiomyopathy diagnosed in 2013 (last EF 35-40% in 1937), chronic systolic CHF, [employer] ICD implanted 2014, CAD with occluded RCA with collaterals collaterals by cath , DM, HTN, HLD, CKD stage III, pancreatitis, PAD with aortoiliac occlusion (med rx per [facility]), NSVT noted in device interrogation with exertional SOB and drop in EF, planned for [facility] today.   Assessment & Plan   Active Problems:   Dyspnea   1. SOB 2. Acute on chronic systolic HF 3. CAD - EF has dropped from prior with multifactorial dyspnea.  Cr: 1.32  UOP yesterday: -1.4 L Weight: 76.7 kg (down from 79.4 kg) Net negative for admission: Neg 2 L Diuretic plan: hold diuretic for now, will assess filling pressure with RHC.  Heart Failure Therapy ACE-I/ARB/ARNI: entresto 24-26 mg BID BB: COREG 6.25 mg BID MRA: consider adding spironolactone as outpatient, BP may not allow at this time. SGLT2I: Recommend starting Farxiga 10 mg daily inpatient.  - continue ASA 81 mg daily.  For cath today. Consent obtained yesterday.     For questions or updates, please contact [place] Please consult [email or web address] for contact info under        Signed, Elouise Munroe, MD  06/17/2020, 10:48 AM

## 2020-06-17 NOTE — Progress Notes (Signed)
TR band removed, R radial site unremarkable. Dressing applied and precautions given-pt voiced understanding. Jessie Foot, RN

## 2020-06-17 NOTE — Patient Outreach (Signed)
  [facility]) Care Management Chronic Special Needs Program    06/17/2020  Name: Richard Flynn, DOB: 12/31/1955  MRN: [id number]   [name] is enrolled in a chronic special needs plan for Heart Failure. Notification was received that client was admitted to [facility] for shortness of breath / Pneumonia.   PLAN: RNCM will send client individualized care plan to [facility] utilization management department.  [facility] Management will continue to provide services for this member through 06/28/20.  The [facility] care management team will assume care 06/29/2020.   Quinn Plowman RN,BSN,CCM [facility] Care Management [phone]

## 2020-06-17 NOTE — Progress Notes (Signed)
Progress Note  Patient Name: Richard Flynn Date of Encounter: 06/17/2020  Primary Cardiologist: Kirk Ruths, MD   Subjective   Feels well overall, but has longstanding fatigue.  Inpatient Medications    Scheduled Meds: . allopurinol  100 mg Oral Daily  . aspirin EC  81 mg Oral Daily  . atorvastatin  80 mg Oral Daily  . carvedilol  6.25 mg Oral BID WC  . cholecalciferol  5,000 Units Oral Daily  . enoxaparin (LOVENOX) injection  40 mg Subcutaneous Q24H  . influenza vaccine adjuvanted  0.5 mL Intramuscular Tomorrow-1000  . insulin aspart  0-9 Units Subcutaneous TID WC  . insulin glargine  24 Units Subcutaneous QHS  . multivitamin with minerals  1 tablet Oral QHS  . omega-3 acid ethyl esters  1 g Oral Daily  . sacubitril-valsartan  1 tablet Oral BID  . sodium chloride flush  3 mL Intravenous Q12H   Continuous Infusions: . sodium chloride    . sodium chloride     PRN Meds: sodium chloride, acetaminophen **OR** acetaminophen, ondansetron **OR** ondansetron (ZOFRAN) IV, sodium chloride flush   Vital Signs    Vitals:   06/16/20 1703 06/16/20 2010 06/17/20 0410 06/17/20 0812  BP: 116/72  102/63 111/61  Pulse: 60 62 65 (!) 56  Resp: 18 16 19 17   Temp:  97.9 F (36.6 C) 97.6 F (36.4 C) 97.7 F (36.5 C)  TempSrc:  Oral Oral Oral  SpO2:  97% 98%   Weight:   76.7 kg   Height:        Intake/Output Summary (Last 24 hours) at 06/17/2020 1048 Last data filed at 06/17/2020 0819 Gross per 24 hour  Intake 620 ml  Output 1400 ml  Net -780 ml   Filed Weights   06/15/20 0350 06/16/20 0518 06/17/20 0410  Weight: 76.7 kg 76 kg 76.7 kg    Telemetry    SR - Personally Reviewed  ECG    SR, T wave abnl laterally - Personally Reviewed  Physical Exam   GEN: No acute distress.   Neck: No JVD Cardiac: regular rhythm, normal rate, no murmurs, rubs, or gallops.  Respiratory: Clear to auscultation bilaterally. GI: Soft, nontender, non-distended  MS: No edema; No  deformity. Neuro:  Nonfocal  Psych: Normal affect   Labs    Chemistry Recent Labs  Lab 06/14/20 1547 06/15/20 0414 06/16/20 0609 06/17/20 0555  NA 135 139 138 138  K 5.3* 4.2 4.3 3.9  CL 106 104 102 103  CO2 18* 21* 22 23  GLUCOSE 187* 67* 98 80  BUN 22 27* 28* 24*  CREATININE 1.39* 1.43* 1.42* 1.32*  CALCIUM 8.3* 8.9 9.4 9.3  PROT 6.9  --   --   --   ALBUMIN 3.7  --   --   --   AST 32  --   --   --   ALT 37  --   --   --   ALKPHOS 73  --   --   --   BILITOT 1.4*  --   --   --   GFRNONAA 56* 54* 54* 59*  ANIONGAP 11 14 14 12      Hematology Recent Labs  Lab 06/14/20 1547 06/15/20 0414 06/16/20 0609  WBC 13.1* 11.3* 9.0  RBC 3.62* 3.61* 3.70*  HGB 10.9* 11.0* 11.1*  HCT 34.4* 33.3* 34.1*  MCV 95.0 92.2 92.2  MCH 30.1 30.5 30.0  MCHC 31.7 33.0 32.6  RDW 14.7 14.6 14.5  PLT  177 164 166    Cardiac EnzymesNo results for input(s): TROPONINI in the last 168 hours. No results for input(s): TROPIPOC in the last 168 hours.   BNP Recent Labs  Lab 06/14/20 1547  BNP 603.0*     DDimer No results for input(s): DDIMER in the last 168 hours.   Radiology    CT ANGIO CHEST PE W OR WO CONTRAST  Result Date: 06/15/2020 CLINICAL DATA:  Chest pain and dyspnea.  Inpatient. EXAM: CT ANGIOGRAPHY CHEST WITH CONTRAST TECHNIQUE: Multidetector CT imaging of the chest was performed using the standard protocol during bolus administration of intravenous contrast. Multiplanar CT image reconstructions and MIPs were obtained to evaluate the vascular anatomy. CONTRAST:  80mL OMNIPAQUE IOHEXOL 350 MG/ML SOLN COMPARISON:  Chest radiograph from one day prior. FINDINGS: Cardiovascular: The study is high quality for the evaluation of pulmonary embolism. There are no filling defects in the central, lobar, segmental or subsegmental pulmonary artery branches to suggest acute pulmonary embolism. Atherosclerotic nonaneurysmal thoracic aorta. Normal caliber pulmonary arteries. Mild cardiomegaly. No  significant pericardial fluid/thickening. Left anterior descending and left circumflex coronary atherosclerosis. Single lead left subclavian ICD with lead tip in right ventricular apex. Mediastinum/Nodes: No discrete thyroid nodules. Unremarkable esophagus. No pathologically enlarged axillary, mediastinal or hilar lymph nodes. Lungs/Pleura: No pneumothorax. Small dependent right pleural effusion. Small loculated left pleural effusion along the left major fissure. Mild centrilobular and paraseptal emphysema. No acute consolidative airspace disease or lung masses. Subpleural 5 mm solid pulmonary nodule in the posterior aspect of the superior segment left lower lobe (series 6/image 52). No additional significant pulmonary nodules. No central airway stenoses. Scattered small parenchymal bands in the medial right middle lobe, lingula and dependent left lower lobe. Upper abdomen: No acute abnormality. Musculoskeletal: No aggressive appearing focal osseous lesions. Moderate thoracic spondylosis. Review of the MIP images confirms the above findings. IMPRESSION: 1. No pulmonary embolism. 2. Mild cardiomegaly. Two-vessel coronary atherosclerosis. 3. Small dependent right pleural effusion. Small loculated left pleural effusion along the left major fissure. 4. Scattered small parenchymal bands in the mid to lower lungs, compatible with mild scarring versus atelectasis. 5. Subpleural 5 mm solid pulmonary nodule in the posterior aspect of the superior segment left lower lobe. Follow-up chest CT recommended in 12 months in this high risk patient.This recommendation follows the consensus statement: Guidelines for Management of Incidental Pulmonary Nodules Detected on CT Images: From the Fleischner Society 2017; Radiology 2017; 284:228-243. 6. Aortic Atherosclerosis (ICD10-I70.0) and Emphysema (ICD10-J43.9). Electronically Signed   By: Ilona Sorrel M.D.   On: 06/15/2020 14:40   ECHOCARDIOGRAM COMPLETE  Result Date: 06/15/2020     ECHOCARDIOGRAM REPORT   Patient Name:   Richard Flynn Date of Exam: 06/15/2020 Medical Rec #:  [id number]     Height:       66.0 in Accession #:    [id number]    Weight:       169.0 lb Date of Birth:  Jul 28, 1955     BSA:          1.862 m Patient Age:    64 years      BP:           115/76 mmHg Patient Gender: M             HR:           63 bpm. Exam Location:  Inpatient Procedure: 2D Echo, 3D Echo, Color Doppler and Cardiac Doppler Indications:    R06.9 DOE; I50.9* Heart failure (unspecified)  History:        Patient has no prior history of Echocardiogram examinations.                 CHF, CAD; Risk Factors:Hypertension, Diabetes and Dyslipidemia.  Sonographer:    Raquel Sarna Senior RDCS Referring Phys: [id number] [place]  1. Left ventricular ejection fraction, by estimation, is 20 to 25%. Left ventricular ejection fraction by 3D volume is 25 %. Left ventricular ejection fraction by 2D MOD biplane is 22.6 %. The left ventricle has severely decreased function. The left ventricle demonstrates global hypokinesis. Left ventricular diastolic parameters are consistent with Grade II diastolic dysfunction (pseudonormalization). Elevated left atrial pressure. The E/e' is 39.  2. Right ventricular systolic function is normal. The right ventricular size is normal. Tricuspid regurgitation signal is inadequate for assessing PA pressure.  3. Left atrial size was mild to moderately dilated.  4. The mitral valve is grossly normal. Mild to moderate mitral valve regurgitation. No evidence of mitral stenosis.  5. The aortic valve is tricuspid. Aortic valve regurgitation is not visualized. No aortic stenosis is present.  6. The inferior vena cava is normal in size with greater than 50% respiratory variability, suggesting right atrial pressure of 3 mmHg. FINDINGS  Left Ventricle: Left ventricular ejection fraction, by estimation, is 20 to 25%. Left ventricular ejection fraction by 2D MOD biplane is 22.6 %. Left ventricular  ejection fraction by 3D volume is 25 %. The left ventricle has severely decreased function.  The left ventricle demonstrates global hypokinesis. The left ventricular internal cavity size was normal in size. There is no left ventricular hypertrophy. Left ventricular diastolic parameters are consistent with Grade II diastolic dysfunction (pseudonormalization). Elevated left atrial pressure. The E/e' is 30. Right Ventricle: The right ventricular size is normal. No increase in right ventricular wall thickness. Right ventricular systolic function is normal. Tricuspid regurgitation signal is inadequate for assessing PA pressure. Left Atrium: Left atrial size was mild to moderately dilated. Right Atrium: Right atrial size was normal in size. Pericardium: Trivial pericardial effusion is present. Mitral Valve: The mitral valve is grossly normal. Mild to moderate mitral valve regurgitation. No evidence of mitral valve stenosis. Tricuspid Valve: The tricuspid valve is grossly normal. Tricuspid valve regurgitation is not demonstrated. No evidence of tricuspid stenosis. Aortic Valve: The aortic valve is tricuspid. Aortic valve regurgitation is not visualized. No aortic stenosis is present. Pulmonic Valve: The pulmonic valve was grossly normal. Pulmonic valve regurgitation is not visualized. No evidence of pulmonic stenosis. Aorta: The aortic root and ascending aorta are structurally normal, with no evidence of dilitation. Venous: The inferior vena cava is normal in size with greater than 50% respiratory variability, suggesting right atrial pressure of 3 mmHg. IAS/Shunts: The atrial septum is grossly normal. Additional Comments: A pacer wire is visualized in the right atrium and right ventricle.  LEFT VENTRICLE PLAX 2D                        Biplane EF (MOD) LVIDd:         5.70 cm         LV Biplane EF:   Left LVIDs:         5.00 cm                          ventricular LV PW:         1.10 cm  ejection LV  IVS:        1.10 cm                          fraction by LVOT diam:     2.10 cm                          2D MOD LV SV:         62                               biplane is LV SV Index:   33                               22.6 %. LVOT Area:     3.46 cm                                Diastology                                LV e' medial:    3.81 cm/s LV Volumes (MOD)               LV E/e' medial:  29.1 LV vol d, MOD    183.0 ml      LV e' lateral:   7.62 cm/s A2C:                           LV E/e' lateral: 14.6 LV vol d, MOD    257.0 ml A4C: LV vol s, MOD    154.0 ml      3D Volume EF A2C:                           LV 3D EF:    Left LV vol s, MOD    183.0 ml                   ventricular A4C:                                        ejection LV SV MOD A2C:   29.0 ml                    fraction by LV SV MOD A4C:   257.0 ml                   3D volume LV SV MOD BP:    50.3 ml                    is 25 %.                                 3D Volume EF:                                3D EF:  25 % RIGHT VENTRICLE RV S prime:     10.80 cm/s TAPSE (M-mode): 1.9 cm LEFT ATRIUM              Index       RIGHT ATRIUM           Index LA diam:        4.90 cm  2.63 cm/m  RA Area:     14.00 cm LA Vol (A2C):   108.0 ml 58.01 ml/m RA Volume:   31.80 ml  17.08 ml/m LA Vol (A4C):   74.3 ml  39.91 ml/m LA Biplane Vol: 89.1 ml  47.86 ml/m  AORTIC VALVE LVOT Vmax:   82.90 cm/s LVOT Vmean:  62.300 cm/s LVOT VTI:    0.179 m  AORTA Ao Root diam: 3.20 cm Ao Asc diam:  3.20 cm MITRAL VALVE MV Area (PHT): 2.52 cm      SHUNTS MV Decel Time: 301 msec      Systemic VTI:  0.18 m MR Peak grad:    65.0 mmHg   Systemic Diam: 2.10 cm MR Mean grad:    36.0 mmHg MR Vmax:         403.00 cm/s MR Vmean:        286.0 cm/s MR PISA:         1.01 cm MR PISA Eff ROA: 10 mm MR PISA Radius:  0.40 cm MV E velocity: 111.00 cm/s MV A velocity: 59.10 cm/s MV E/A ratio:  1.88 Eleonore Chiquito MD Electronically signed by Eleonore Chiquito MD Signature Date/Time:  06/15/2020/3:13:10 PM    Final     Cardiac Studies   Cath pending Echo with LVEF 20-25%, G2DD, E/e' 29, mild-mod MR.   Patient Profile     Richard Flynn is a 64 y.o. male with a hx of longstanding cardiomyopathy diagnosed in 2013 (last EF 35-40% in 4166), chronic systolic CHF, [employer] ICD implanted 2014, CAD with occluded RCA with collaterals collaterals by cath , DM, HTN, HLD, CKD stage III, pancreatitis, PAD with aortoiliac occlusion (med rx per [facility]), NSVT noted in device interrogation with exertional SOB and drop in EF, planned for [facility] today.   Assessment & Plan   Active Problems:   Dyspnea   1. SOB 2. Acute on chronic systolic HF 3. CAD - EF has dropped from prior with multifactorial dyspnea.  Cr: 1.32  UOP yesterday: -1.4 L Weight: 76.7 kg (down from 79.4 kg) Net negative for admission: Neg 2 L Diuretic plan: hold diuretic for now, will assess filling pressure with RHC.  Heart Failure Therapy ACE-I/ARB/ARNI: entresto 24-26 mg BID BB: COREG 6.25 mg BID MRA: consider adding spironolactone as outpatient, BP may not allow at this time. SGLT2I: Recommend starting Farxiga 10 mg daily inpatient.  - continue ASA 81 mg daily.  For cath today. Consent obtained yesterday.     For questions or updates, please contact [place] Please consult [email or web address] for contact info under        Signed, Elouise Munroe, MD  06/17/2020, 10:48 AM

## 2020-06-17 NOTE — Telephone Encounter (Signed)
Pt currently admitted.

## 2020-06-17 NOTE — Interval H&P Note (Signed)
Cath Lab Visit (complete for each Cath Lab visit)  Clinical Evaluation Leading to the Procedure:   ACS: Yes.    Non-ACS:    Anginal Classification: CCS IV  Anti-ischemic medical therapy: Minimal Therapy (1 class of medications)  Non-Invasive Test Results: No non-invasive testing performed  Prior CABG: No previous CABG  Severe PAD. Occluded Aorta.     History and Physical Interval Note:  06/17/2020 1:32 PM  Richard Flynn  has presented today for surgery, with the diagnosis of unstable angina.  The various methods of treatment have been discussed with the patient and family. After consideration of risks, benefits and other options for treatment, the patient has consented to  Procedure(s): LEFT HEART CATH AND CORONARY ANGIOGRAPHY (N/A) as a surgical intervention.  The patient's history has been reviewed, patient examined, no change in status, stable for surgery.  I have reviewed the patient's chart and labs.  Questions were answered to the patient's satisfaction.     Larae Grooms

## 2020-06-17 NOTE — Progress Notes (Addendum)
Heart Failure Patient Advocate Encounter   Received notification from [employer] that prior authorization for Delene Loll is required.   PA submitted on CoverMyMeds Key [id number] Status is approved from 06/17/20 through 06/17/21.  Copay $0 per month  Kerby Nora, PharmD, BCPS Heart Failure Stewardship Pharmacist Phone [phone]  Please check [email or web address] for unit-specific pharmacist phone numbers

## 2020-06-17 NOTE — Telephone Encounter (Signed)
Pt is scheduled for a TOC appt on 07/02/19 at 11:15 am with Kerin Ransom per Roby Lofts. Please advise.

## 2020-06-17 NOTE — Progress Notes (Signed)
Heart Failure Stewardship Pharmacist Progress Note   PCP: Finis Bud, MD PCP-Cardiologist: Kirk Ruths, MD    HPI:  64 yo M with PMH of CHF s/p ICD placement in 2014, CAD, DM, HTN, HLD, CKD, and PAD. He presented to the ED with 2 weeks of DOE and chest tightness. An ECHO done on 06/15/20 found to have LVEF 20-25% (down from 35-40% in 2017). R/LHC done on 06/17/20 with no significant changes from prior cath (prox RCA 100% stenosed, L to R collaterals), PAP 14, PCWP 11, CO 4.1, CI 2.2.   Current HF Medications: Carvedilol 6.25 mg BID Entresto 24/26 mg BID  Prior to admission HF Medications: Carvedilol 6.25 mg BID Lisinopril 40 mg daily Spironolactone 25 mg daily  Pertinent Lab Values: . Serum creatinine 1.32, BUN 24, Potassium 3.9, Sodium 138, BNP 603  Vital Signs: . Weight: 169 lbs (admission weight: 175 lbs) . Blood pressure: 100-110/60-70s  . Heart rate: 50s   Medication Assistance / Insurance Benefits Check: Does the patient have prescription insurance?  Yes Type of insurance plan: Medicare - [employer]  Does the patient qualify for medication assistance through manufacturers or grants?   Pending household income information . Eligible grants and/or patient assistance programs: pending . Medication assistance applications in progress: none  . Medication assistance applications approved: none Approved medication assistance renewals will be completed by: Rachel:  [facility] Is the patient willing to use [facility] at discharge? Yes Is the patient willing to transition their outpatient pharmacy to utilize a [facility] outpatient pharmacy?   Pending    Assessment: 1. Acute on chronic systolic CHF (EF 40-81%), due to ICM. NYHA class II symptoms. - Continue carvedilol 6.25 mg BID - Continue Entresto 24/26 mg BID - Consider restarting spironolactone potentially as outpatient if BP  trends up. Too soft to add at this time. - Consider starting Jardiance 10 mg daily - neutral effect on BP, SCr improving.  **Note: Wilder Glade is not on pt's formulary   Plan: 1) Medication changes recommended at this time: - Start Jardiance 10 mg daily  2) Patient assistance application(s): - Prior authorization for Entresto required - will complete this today - Wilder Glade is not on patient's formulary - Jardiance copay: $0  3)  Education  - To be completed prior to discharge  Kerby Nora, PharmD, BCPS Heart Failure Cytogeneticist Phone [phone]

## 2020-06-17 NOTE — Progress Notes (Signed)
   Subjective:   Hospital day: 3  Overnight event: NAEOV  This AM, patient reports he is doing well. Denies chest pain, dyspnea, palpitations, leg swelling. Family mentions he was able to walk some yesterday without difficulty. Discussed plan for cath today. Also explained importance of following up with PCP to re-check kidney function.  Objective:  Vital signs in last 24 hours: Vitals:   06/16/20 2010 06/17/20 0410 06/17/20 0812 06/17/20 1338  BP:  102/63 111/61   Pulse: 62 65 (!) 56   Resp: 16 19 17    Temp: 97.9 F (36.6 C) 97.6 F (36.4 C) 97.7 F (36.5 C)   TempSrc: Oral Oral Oral   SpO2: 97% 98%  99%  Weight:  76.7 kg    Height:        Physical Exam  General: Pleasant, well-appearing elderly man in bed. No acute distress. CV: RRR. No m/r/g. No LE edema Pulmonary: Lungs CTAB. No wheezing or rales.  Abd: Soft, Non-tender, non-distended. Normal BS. Extremities: Palpable pulses.  Skin: Warm and dry. No obvious rash or lesions Neuro: A&Ox3. No focal neuro deficits Psych: Normal mood and affect  Assessment/Plan: Richard Flynn is a 64 y.o. male w/ PMH of bothischemicandnonischemiccardiomyopathy, HFrEF (EF 35-40% in 2017) s/p ICD in 2014, CAD (last cath 2013 with occluded proximal RCA with L to R collaterals), DM, HTN, HLD, CKD 3, PAD with aortoiliac occlusion who presented with 2 weeks of DOE w/ associated chest tightness and found to have lab and imaging finding concerning for possible pneumonia vs. HFrEF exacerbation. Repeat ECHO shows worsening EF (20-25%). Plan for LHC/RHC during admission. SOB has resolved per patient.  Active Problems:   Dyspnea  Acute on chronic systolic heart failure Shortness of breath has resolved per patient.  Net I&O of -1.4 L over the last 24 hr. Plan for heart cath today. This AM, patient states he feels great. He denies any CP, SOB, palpitations or leg swelling. Ready for heart cath today.  --Cardiology folllowing, appreciate  recs --LHC/RHC today --Continue to hold lasix for now --Daily weights --Strict I&Os  Shortness of breath, resolved CTA negative for PE but shows small R pleural effusion and 5 mm solid pulmonary nodule that will require follow up chest CT in 12 months. SOB and cough has resolved. No fevers/chills.  --Monitor vitals.  --Repeat chest CT in 12 months  CAD PAD w/ aortoiliac occlusion Previous cath 2013 with occluded proximal RCA with collaterals. Patient denies any leg pains.  --RHC/LHC today --Continue Lipitor and ASA  HTN BP soft overnight but improved to 111/61. Will continue to monitor closely. HR slightly low at 56. Will consider holding beta blocker if HR remains low. --Continue Entresto 24-26 mg BID --Continue coreg 6.25 mg BID --Daily vitals  CKD 3 Normocytic anemia.  Stable. At baseline creatinine. Hgb stable at 11.1 --Daily BMP --Avoid nephrotoxic agents  IDDM A1c of 6.7 during admission. CBG of 157 overnight. --Continue Lantus 24u daily at bedtime --SSI, sensitive --CBG monitoring   Diet: HH, NPO at midnight IVF: None VTE: Lovenox 40 mg subcu daily CODE: Full Code  Prior to Admission Living Arrangement: Home Anticipated Discharge Location: Home Barriers to Discharge: Medical workup Dispo: Anticipated discharge in approximately 1-2 day(s).   Signed: Lacinda Axon, MD 06/17/2020, 6:42 AM  Pager: [phone] Internal Medicine Teaching Service After 5pm on weekdays and 1pm on weekends: On Call pager: [phone]

## 2020-06-18 ENCOUNTER — Encounter (HOSPITAL_COMMUNITY): Payer: Self-pay | Admitting: Interventional Cardiology

## 2020-06-18 LAB — CBC
HCT: 35.6 % — ABNORMAL LOW (ref 39.0–52.0)
Hemoglobin: 11.6 g/dL — ABNORMAL LOW (ref 13.0–17.0)
MCH: 30.1 pg (ref 26.0–34.0)
MCHC: 32.6 g/dL (ref 30.0–36.0)
MCV: 92.2 fL (ref 80.0–100.0)
Platelets: 179 10*3/uL (ref 150–400)
RBC: 3.86 MIL/uL — ABNORMAL LOW (ref 4.22–5.81)
RDW: 14.6 % (ref 11.5–15.5)
WBC: 8.9 10*3/uL (ref 4.0–10.5)
nRBC: 0 % (ref 0.0–0.2)

## 2020-06-18 LAB — BASIC METABOLIC PANEL
Anion gap: 11 (ref 5–15)
BUN: 27 mg/dL — ABNORMAL HIGH (ref 8–23)
CO2: 22 mmol/L (ref 22–32)
Calcium: 9.7 mg/dL (ref 8.9–10.3)
Chloride: 104 mmol/L (ref 98–111)
Creatinine, Ser: 1.38 mg/dL — ABNORMAL HIGH (ref 0.61–1.24)
GFR, Estimated: 56 mL/min — ABNORMAL LOW (ref >60)
Glucose, Bld: 138 mg/dL — ABNORMAL HIGH (ref 70–99)
Potassium: 3.9 mmol/L (ref 3.5–5.1)
Sodium: 137 mmol/L (ref 135–145)

## 2020-06-18 LAB — GLUCOSE, CAPILLARY: Glucose-Capillary: 142 mg/dL — ABNORMAL HIGH (ref 70–99)

## 2020-06-18 MED ORDER — EMPAGLIFLOZIN 10 MG PO TABS: 10.0000 mg | ORAL_TABLET | Freq: Every day | ORAL | 1 refills | Status: DC

## 2020-06-18 MED ORDER — SACUBITRIL-VALSARTAN 24-26 MG PO TABS: 1.0000 | ORAL_TABLET | Freq: Two times a day (BID) | ORAL | 1 refills | Status: DC

## 2020-06-18 NOTE — Progress Notes (Signed)
Heart Failure Stewardship Pharmacist Progress Note   PCP: Finis Bud, MD PCP-Cardiologist: Kirk Ruths, MD    HPI:  64 yo M with PMH of CHF s/p ICD placement in 2014, CAD, DM, HTN, HLD, CKD, and PAD. He presented to the ED with 2 weeks of DOE and chest tightness. An ECHO done on 06/15/20 found to have LVEF 20-25% (down from 35-40% in 2017). R/LHC done on 06/17/20 with no significant changes from prior cath (prox RCA 100% stenosed, L to R collaterals), PAP 14, PCWP 11, CO 4.1, CI 2.2.   Current HF Medications: Carvedilol 6.25 mg BID Entresto 24/26 mg BID Jardiance 10 mg daily  Prior to admission HF Medications: Carvedilol 6.25 mg BID Lisinopril 40 mg daily Spironolactone 25 mg daily  Pertinent Lab Values:  Serum creatinine 1.38, BUN 27, Potassium 3.9, Sodium 137, BNP 603  Vital Signs:  Weight: 167 lbs (admission weight: 175 lbs)  Blood pressure: 112/73   Heart rate: 50-60s   Medication Assistance / Insurance Benefits Check: Does the patient have prescription insurance?  Yes Type of insurance plan: Medicare - [employer]  Outpatient Pharmacy:  [facility]   Assessment: 1. Acute on chronic systolic CHF (EF 90-93%), due to ICM. NYHA class II symptoms. - Continue carvedilol 6.25 mg BID - Continue Entresto 24/26 mg BID - Consider restarting spironolactone potentially as outpatient if BP trends up. Too soft to add at this time. - Continue Jardiance 10 mg daily **Note: Wilder Glade is not on pt's formulary   Plan: 1) Medication changes recommended at this time: - continue current regimen - discharge today  2) Patient assistance application(s): - Prior authorization for Entresto completed - copay $0 - Farxiga is not on patient's formulary - Jardiance copay: $0   Kerby Nora, PharmD, BCPS Heart Failure Stewardship Pharmacist Phone [phone]

## 2020-06-18 NOTE — Progress Notes (Signed)
Pt received discharge instructions and does not have any additional questions or concerns at this time. Pt encouraged to follow up with his appointments and take medications as prescribed.

## 2020-06-18 NOTE — TOC Transition Note (Signed)
Transition of Care [facility]) - CM/SW Discharge Note   Patient Details  Name: Richard Flynn MRN: [id number] Date of Birth: [birth year]  [facility] CM/SW Contact:  Bartholomew Crews, RN Phone Number: [phone] 06/18/2020, 10:02 AM   Clinical Narrative:     Acknowledging [facility] consult for benefits check for Jardiance and Entresto. Benefits check requested. Progress note from Stewardship Pharmacist indication prior auth completed for [employer]. No copay for Entresto and Jardiance noted.   Final next level of care: Home/Self Care Barriers to Discharge: No Barriers Identified   Patient Goals and CMS Choice        Discharge Placement                       Discharge Plan and Services                                     Social Determinants of Health (SDOH) Interventions     Readmission Risk Interventions No flowsheet data found.

## 2020-06-18 NOTE — Telephone Encounter (Signed)
Currently still admitted

## 2020-06-18 NOTE — Progress Notes (Signed)
° °  Subjective:   Hospital day: 4  Overnight event: NAEOV  This AM, patient was found sitting in chair this morning ready to go. He continued to deny any CP, SOB, cough, palpitations, fever/chiils. He denied any dizziness after starting up. He was informed that the heart cath did not show any significant changes in his vessels so we plan on managing his CAD with medications. Patient is aware he will be starting two new medications.   Objective:  Vital signs in last 24 hours: Vitals:   06/17/20 1619 06/17/20 1705 06/17/20 2103 06/18/20 0445  BP: 108/65 110/60  112/73  Pulse: 60 64  68  Resp: 16 16 16 16   Temp: (!) 97.4 F (36.3 C)  97.9 F (36.6 C) (!) 97.3 F (36.3 C)  TempSrc: Oral  Oral Oral  SpO2: 96% 99%  100%  Weight:    76 kg  Height:        Physical Exam  General: Pleasant, well-appearing man sitting in chair. NAD CV: RRR. No m/r/g. No LE edema Lungs: CTAB. No wheezing or rales Abd: Soft, non-tender, non-distended. Normal BS Extremities: Palpable pulses. Normal ROM Skin: Warm and dry. No obvious rash Neuro: A&Ox3. No focal neuro deficits Psych: Ready to leave  Assessment/Plan: Richard Flynn is a 64 y.o. male w/ PMH of bothischemicandnonischemiccardiomyopathy, HFrEF (EF 35-40% in 2017) s/p ICD in 2014, CAD (last cath 2013 with occluded proximal RCA with L to R collaterals), DM, HTN, HLD, CKD 3, PAD with aortoiliac occlusion who presented with 2 weeks of DOE w/ associated chest tightness and found to have lab and imaging finding concerning for possible pneumonia vs. HFrEF exacerbation. Repeat ECHO shows worsening EF (20-25%).  Heart cath with no significant change from previous. Will resume medical therapy and discharge today.   Active Problems:   Dyspnea   Acute on chronic systolic heart failure (HCC)  Acute on chronic systolic heart failure Repeat echo showed EF 20-25%. Shortness of breath has resolved. Net I&O of -2.7 L since admission.  No significant disease  from cath yesterday. Will resume Lasix. --Scheduled to follow-up with cardiology on July 01, 2020.  CAD PAD w/ aortoiliac occlusion Previous cath 2013 with occluded proximal RCA with collaterals. Repeat cath 12 12 /20/21 showed stable occlusion of RCA with collaterals, no significant change. Cards recommending medical therapy. Continue continue Lipitor 80 mg daily and aspirin 81 mg daily.  --Follow-up with cardiology as above  Shortness of breath, resolved CTA negative for PE but shows small R pleural effusion and 5 mm solid pulmonary nodule.  Patient will follow-up CT chest in 12 months.  HTN Blood pressure stable at 112/73. Stop lisinopril. Continue Entresto 24-26 mg BID and coreg 6.25 mg BID.  CKD 3 Normocytic anemia.  Creatinine of 1.3 today stable from creatinine on admission.  Hemoglobin stable at 11.6.  --We will need follow-up BMP to monitor possible contrast nephropathy.  IDDM A1c of 6.7 during admission.  CBG of 142 this morning.  Continue home Lantus 24 units daily at bedtime. --Start Jardiance at discharge  Diet: HH, NPO at midnight IVF: None VTE: Lovenox 40 mg subcu daily CODE: Full Code  Prior to Admission Living Arrangement: Home Anticipated Discharge Location: Home Barriers to Discharge: None Dispo: Anticipated discharge today  Signed: Lacinda Axon, MD 06/18/2020, 5:55 AM  Pager: [phone] Internal Medicine Teaching Service After 5pm on weekdays and 1pm on weekends: On Call pager: [phone]

## 2020-06-18 NOTE — Discharge Instructions (Signed)
Richard Flynn,   It was a pleasure taking care of you at [place] were admitted for shortness of breath and treated for heart failure exacerbation. We are discharging you home now that you are doing better. Please follow the following instructions.   1) Follow-up with cardiology on July 01, 2020 2) Follow up with your PCP in the next few days to check your kidney function 3) Start taking Delene Loll and Jadiance  Take care,  Dr. Linwood Dibbles, MD, MPH    PLEASE REMEMBER TO BRING ALL OF YOUR MEDICATIONS TO [facility] [place].  PLEASE ATTEND ALL SCHEDULED FOLLOW-UP APPOINTMENTS.   Activity: Increase activity slowly as tolerated. You may shower, but no soaking baths (or swimming) for 1 week. No driving for 24 hours. No lifting over 5 lbs for 1 week. No sexual activity for 1 week.   You May Return to Work: in 1 week (if applicable)  Wound Care: You may wash cath site gently with soap and water. Keep cath site clean and dry. If you notice pain, swelling, bleeding or pus at your cath site, please call [phone].  Heart Failure Education: 1. Weigh yourself EVERY morning after you go to the bathroom but before you eat or drink anything. Write this number down in a weight log/diary. If you gain 3 pounds overnight or 5 pounds in a week, call the office. 2. Take your medicines as prescribed. If you have concerns about your medications, please call us before you stop taking them.  3. Eat low salt foods--Limit salt (sodium) to 2000 mg per day. This will help prevent your body from holding onto fluid. Read food labels as many processed foods have a lot of sodium, especially canned goods and prepackaged meats. If you would like some assistance choosing low sodium foods, we would be happy to set you up with a nutritionist. 4. Stay as active as you can everyday. Staying active will give you more energy and make your muscles stronger. Start with 5 minutes at a time and  work your way up to 30 minutes a day. Break up your activities--do some in the morning and some in the afternoon. Start with 3 days per week and work your way up to 5 days as you can.  If you have chest pain, feel short of breath, dizzy, or lightheaded, STOP. If you don't feel better after a short rest, call 911. If you do feel better, call the office to let us know you have symptoms with exercise. 5. Limit all fluids for the day to less than 2 liters. Fluid includes all drinks, coffee, juice, ice chips, soup, jello, and all other liquids.

## 2020-06-18 NOTE — TOC Benefit Eligibility Note (Signed)
Transition of Care [facility]) Benefit Eligibility Note    Patient Details  Name: Richard Flynn MRN: [id number] Date of Birth: Oct 18, 1955   Medication/Dose: Delene Loll 24mg -26mg  bid for 30 day supply and Jardiance 10mg  30 day supply daily  Covered?: Yes  Tier: 3 Drug  Prescription Coverage Preferred Pharmacy: [facility]  Spoke with Person/Company/Phone Number:: [place] PH# [phone]  Co-Pay: Zero  Prior Approval: Yes [phone] for Delene Loll)  Deductible:  (no deductible)       Shelda Altes Phone Number: 06/18/2020, 12:03 PM

## 2020-06-18 NOTE — Plan of Care (Signed)
  Problem: Cardiovascular: Goal: Vascular access site(s) Level 0-1 will be maintained Outcome: Completed/Met

## 2020-06-18 NOTE — Telephone Encounter (Signed)
Spoke with pt, he is being discharged from the hospital today.

## 2020-06-18 NOTE — Progress Notes (Addendum)
Progress Note  Patient Name: Richard Flynn Date of Encounter: 06/18/2020  [facility] Cardiologist: Kirk Ruths, MD   Subjective   Feeling well. No chest pain, sob or palpitations. Denies excess salt intake.   Inpatient Medications    Scheduled Meds: . allopurinol  100 mg Oral Daily  . aspirin EC  81 mg Oral Daily  . atorvastatin  80 mg Oral Daily  . carvedilol  6.25 mg Oral BID WC  . cholecalciferol  5,000 Units Oral Daily  . empagliflozin  10 mg Oral Daily  . enoxaparin (LOVENOX) injection  40 mg Subcutaneous Q24H  . influenza vaccine adjuvanted  0.5 mL Intramuscular Tomorrow-1000  . insulin aspart  0-9 Units Subcutaneous TID WC  . insulin glargine  24 Units Subcutaneous QHS  . multivitamin with minerals  1 tablet Oral QHS  . omega-3 acid ethyl esters  1 g Oral Daily  . sacubitril-valsartan  1 tablet Oral BID  . sodium chloride flush  3 mL Intravenous Q12H  . sodium chloride flush  3 mL Intravenous Q12H   Continuous Infusions: . sodium chloride     PRN Meds: sodium chloride, acetaminophen **OR** acetaminophen, ondansetron **OR** ondansetron (ZOFRAN) IV, sodium chloride flush   Vital Signs    Vitals:   06/17/20 1619 06/17/20 1705 06/17/20 2103 06/18/20 0445  BP: 108/65 110/60  112/73  Pulse: 60 64  68  Resp: 16 16 16 16   Temp: (!) 97.4 F (36.3 C)  97.9 F (36.6 C) (!) 97.3 F (36.3 C)  TempSrc: Oral  Oral Oral  SpO2: 96% 99%  100%  Weight:    76 kg  Height:        Intake/Output Summary (Last 24 hours) at 06/18/2020 0747 Last data filed at 06/18/2020 0447 Gross per 24 hour  Intake 660 ml  Output 2000 ml  Net -1340 ml   Last 3 Weights 06/18/2020 06/17/2020 06/16/2020  Weight (lbs) 167 lb 9.6 oz 169 lb 167 lb 8 oz  Weight (kg) 76.023 kg 76.658 kg 75.978 kg      Telemetry    Sinus brady/rhythm at 50-60s - Personally Reviewed  ECG    N/A  Physical Exam   GEN: No acute distress.   Neck: No JVD Cardiac: RRR, no murmurs, rubs, or  gallops. R radial cath site stable  Respiratory: Clear to auscultation bilaterally. GI: Soft, nontender, non-distended  MS: No edema; No deformity. Neuro:  Nonfocal  Psych: Normal affect   Labs    High Sensitivity Troponin:   Recent Labs  Lab 06/14/20 1547 06/14/20 1823  TROPONINIHS 27* 28*      Chemistry Recent Labs  Lab 06/14/20 1547 06/15/20 0414 06/16/20 0609 06/17/20 0555 06/17/20 1353 06/17/20 1357 06/18/20 0209  NA 135   < > 138 138 140 141 137  K 5.3*   < > 4.3 3.9 4.2 4.2 3.9  CL 106   < > 102 103  --   --  104  CO2 18*   < > 22 23  --   --  22  GLUCOSE 187*   < > 98 80  --   --  138*  BUN 22   < > 28* 24*  --   --  27*  CREATININE 1.39*   < > 1.42* 1.32*  --   --  1.38*  CALCIUM 8.3*   < > 9.4 9.3  --   --  9.7  PROT 6.9  --   --   --   --   --   --  ALBUMIN 3.7  --   --   --   --   --   --   AST 32  --   --   --   --   --   --   ALT 37  --   --   --   --   --   --   ALKPHOS 73  --   --   --   --   --   --   BILITOT 1.4*  --   --   --   --   --   --   GFRNONAA 56*   < > 54* 59*  --   --  56*  ANIONGAP 11   < > 14 12  --   --  11   < > = values in this interval not displayed.     Hematology Recent Labs  Lab 06/15/20 0414 06/16/20 0609 06/17/20 1353 06/17/20 1357 06/18/20 0209  WBC 11.3* 9.0  --   --  8.9  RBC 3.61* 3.70*  --   --  3.86*  HGB 11.0* 11.1* 12.2* 11.9* 11.6*  HCT 33.3* 34.1* 36.0* 35.0* 35.6*  MCV 92.2 92.2  --   --  92.2  MCH 30.5 30.0  --   --  30.1  MCHC 33.0 32.6  --   --  32.6  RDW 14.6 14.5  --   --  14.6  PLT 164 166  --   --  179    BNP Recent Labs  Lab 06/14/20 1547  BNP 603.0*     Radiology    CARDIAC CATHETERIZATION  Result Date: 06/17/2020  Prox RCA lesion is 100% stenosed. Left to right collaterals.  1st Mrg lesion is 50% stenosed. Calcified lesion. Other mild, nonobstructive disease on the left side.  LV end diastolic pressure is normal.  There is no aortic valve stenosis.  Ao sat 98%, PA sat 58%,  PA pressure 27/7, mean PA 14 mm Hg; mean PCWP 11 mm Hg; CO 4.11 L/min; CI 2.2  No significant change from prior cath report.  Medical therapy for CAD and heart failure. Appears euvolemic by right heart cath.    Cardiac Studies   Echo 06/15/2020 1. Left ventricular ejection fraction, by estimation, is 20 to 25%. Left  ventricular ejection fraction by 3D volume is 25 %. Left ventricular  ejection fraction by 2D MOD biplane is 22.6 %. The left ventricle has  severely decreased function. The left  ventricle demonstrates global hypokinesis. Left ventricular diastolic  parameters are consistent with Grade II diastolic dysfunction  (pseudonormalization). Elevated left atrial pressure. The E/e' is 70.  2. Right ventricular systolic function is normal. The right ventricular  size is normal. Tricuspid regurgitation signal is inadequate for assessing  PA pressure.  3. Left atrial size was mild to moderately dilated.  4. The mitral valve is grossly normal. Mild to moderate mitral valve  regurgitation. No evidence of mitral stenosis.  5. The aortic valve is tricuspid. Aortic valve regurgitation is not  visualized. No aortic stenosis is present.  6. The inferior vena cava is normal in size with greater than 50%  respiratory variability, suggesting right atrial pressure of 3 mmHg.    LEFT HEART CATH AND CORONARY ANGIOGRAPHY  06/17/2020  RIGHT HEART CATH    Conclusion    Prox RCA lesion is 100% stenosed. Left to right collaterals.  1st Mrg lesion is 50% stenosed. Calcified lesion. Other mild, nonobstructive disease on the left  side.  LV end diastolic pressure is normal.  There is no aortic valve stenosis.  Ao sat 98%, PA sat 58%, PA pressure 27/7, mean PA 14 mm Hg; mean PCWP 11 mm Hg; CO 4.11 L/min; CI 2.2   No significant change from prior cath report.  Medical therapy for CAD and heart failure.   Appears euvolemic by right heart cath.    Patient Profile     64 y.o. male  with a hx of longstanding cardiomyopathy diagnosed in 2013 (last EF 35-40% in 5631), chronic systolic CHF, [employer] ICD implanted 2014, CAD with occluded RCA with collaterals collaterals by cath , DM, HTN, HLD, CKD stage III, pancreatitis, PAD with aortoiliac occlusion (med rx per [facility]), NSVT noted in device interrogation with exertional SOB and drop in EF.   Assessment & Plan  1. Shortness of breath  2. Acute on chronic systolic CHF - BNP 497 on admission, CXR more consistent with left midlung opacity as opposed to edema - Echo with LVEF 20-25%, G2DD, E/e' 29, mild-mod MR.  - Diuresed negative 2.6L. Weight down 2 lb (169>>167).  - Cardiac cath with no significant change in anatomy from prior. He is adequately diuresed based on RHC.  - Euvolemic by exam  - Continue Coreg 6.25mg  BID, Entresto 24/26mg  BID  - Home Spironolactone held on admit due to hyperkalemia>> Consider adding back as outpatient - Lasix dose per MD - Continue Jardiance 10mg  qd  2. CAD - Cath as above - Continue ASA, Statin and BB  3. CKD III - Renal function stable   He was not on lasix at home. He only got 2 dose of IV lasix 40mg  on admit. Heart Failure education given. He is okay to use [facility] pharmacy. Case manage to check cost of entresto and Jardiance. Make sure he gets enough supplies. Likely home today.   Addendum: Per Dr. Shivank Pinedo>>>Add Lasix 40mg  PO PRN.   For questions or updates, please contact [place] Please consult [email or web address] for contact info under        Jarrett Soho, PA  06/18/2020, 7:47 AM    Patient seen and examined with [facility] PA.  Agree as above, with the following exceptions and changes as noted below. He feels well, we discussed R/L HC results. Gen: NAD, CV: RRR, no murmurs, Lungs: clear, Abd: soft, Extrem: Warm, well perfused, no edema, Neuro/Psych: alert and oriented x 3, normal mood and affect. All available labs, radiology testing, previous  records reviewed. Tolerating Entresto, Jardiance, Coreg. Consider restarting spironolactone as outpatient. Lasix can likely be prn at this point, to be adjusted at follow up as needed. Patient in agreement with plan, he feels ready for hospital discharge.  Elouise Munroe, MD 06/18/20 10:32 AM

## 2020-06-19 ENCOUNTER — Ambulatory Visit: Payer: HMO | Admitting: Physician Assistant

## 2020-06-19 ENCOUNTER — Other Ambulatory Visit: Payer: Self-pay

## 2020-06-19 NOTE — Patient Outreach (Addendum)
  [facility]) Care Management Chronic Special Needs Program    06/19/2020  Name: Richard Flynn, DOB: 1956-06-23  MRN: [id number]   [name] is enrolled in a chronic special needs plan for Diabetes. Notification received, client discharged from [facility] on 06/18/20 to home. Individualized care plan reviewed and updated.  Transition of care to be completed by [facility] general discharge.  Goals Addressed            This Visit's Progress   . General - Client will not be readmitted within 30 days (C-SNP)       Please follow discharge instructions and call provider if you have any questions. Please attend all follow up appointments as scheduled. Please take your medications as prescribed. Please call 24 Hour nurse advice line as needed [phone]).       PLAN:  Care plan updated and sent to primary care provider [facility] will continue to provide services for this member through 06/28/20.  The [employer] care management team will assume care 06/29/2020.  Case manager will follow up with client in 1-2 months.   Quinn Plowman RN,BSN,CCM [facility] Care Management [phone]

## 2020-06-19 NOTE — Telephone Encounter (Signed)
Patient contacted regarding discharge from [facility] on 06/16/20.  Patient understands to follow up with provider Kerin Ransom, [place] on 07/02/19 at 11:15 am at [facility]. Patient understands discharge instructions? yes Patient understands medications and regiment? yes Patient understands to bring all medications to this visit? yes

## 2020-07-01 ENCOUNTER — Ambulatory Visit: Payer: HMO | Admitting: Cardiology

## 2020-07-01 DIAGNOSIS — I10 Essential (primary) hypertension: Secondary | ICD-10-CM | POA: Diagnosis not present

## 2020-07-01 DIAGNOSIS — I5023 Acute on chronic systolic (congestive) heart failure: Secondary | ICD-10-CM | POA: Diagnosis not present

## 2020-07-01 DIAGNOSIS — J189 Pneumonia, unspecified organism: Secondary | ICD-10-CM | POA: Diagnosis not present

## 2020-07-01 DIAGNOSIS — N189 Chronic kidney disease, unspecified: Secondary | ICD-10-CM | POA: Diagnosis not present

## 2020-07-01 NOTE — Progress Notes (Signed)
Cardiology Office Note   Date:  07/01/2020   ID:  Richard Flynn, DOB May 02, 1956, MRN [id number]  PCP:  Richard Bud, MD  Cardiologist: Richard Flynn  No chief complaint on file.    History of Present Illness: Richard Flynn is a 65 y.o. male who presents for TOC post hospitalization follow up after admission on 06/17/2020 for acute shortness of breath, NSVT per device interrogation, and drop in EF with diagnosis of acute on chronic systolic CHF.   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   Echocardiogram 06/15/2020 revealed EF of 0000000 Grade 2 Diastolic Dysfunction, and mild to moderate MR. A cardiac cath was completed on 06/17/2020 revealing a proximal lesion of the RCA that was 100%v stenosed with L-R collaterals. 1st marginal lesion is 50% stenosed and was a calcified lesion, with mild non-obstructive disease on the left side. There was no significant change from prior cardiac cath.   He was diuresed with IV lasix with 1,340 cc diureses, uncertain weight on discharge (167lbs ?). Case management was consulted to assist with medications and he received TOC medications on discharge. He did not have lasix at home. Richard Flynn and [place] cost was also investigated to allow him to receive this. Case management did find that he was eligible for Entresto 24/26 BID and Jardiance 10 mg daily. He received a 30 day supply sent to [facility]. Will need refills today.   He comes today feeling well.  He has gained about 2 pounds since returning home from the hospital.  Discharge weight was 166 pounds and today day in his home he was 168 pounds.  He has been medically compliant with this Entresto and Jardiance.  He states she has  been drinking a lot of water.  He denies significant shortness of breath but does get fatigued with exertion.  He states that this is getting better.  He denies any muscle cramping chest pressure dizziness or PND or orthopnea.  Past Medical History:  Diagnosis Date   CAD (coronary artery disease)    a. [facility] notes suggest cardiac cath 2013 showed occluded proximal RCA with left to right collaterals, 20-40% proximal LAD, 70% distal circumflex artery.   Cardiomyopathy ([place])    Chronic systolic CHF (congestive heart failure) (HCC)    CKD (chronic kidney disease), stage III (HCC)    Diabetes mellitus without complication (HCC)    Hyperlipidemia    Hypertension    NSVT (nonsustained ventricular tachycardia) ([place])    mentioned in device interrogation   PAD (peripheral artery disease) ([place])     with aortoiliac occlusion (med rx per [facility])   Pancreatitis     Past Surgical History:  Procedure Laterality Date   CHOLECYSTECTOMY     ICD IMPLANT     LEFT HEART CATH AND CORONARY ANGIOGRAPHY N/A 06/17/2020   Procedure: LEFT HEART CATH AND CORONARY ANGIOGRAPHY;  Surgeon: Jettie Booze, MD;  Location: [place] [facility];  Service: Cardiovascular;  Laterality: N/A;   RIGHT HEART CATH N/A 06/17/2020   Procedure: RIGHT HEART CATH;  Surgeon: Jettie Booze, MD;  Location: [place] [facility];  Service: Cardiovascular;  Laterality: N/A;     Current Outpatient Medications  Medication Sig Dispense Refill   allopurinol (ZYLOPRIM) 100 MG tablet  Take 100 mg by mouth daily.     aspirin EC 81 MG tablet Take 81 mg by mouth daily. Swallow whole.     atorvastatin (LIPITOR) 80 MG tablet Take 1 tablet (80 mg total) by mouth daily. 90 tablet 3   carvedilol (COREG) 6.25 MG tablet Take 6.25 mg by mouth 2 (two) times daily with a meal.     Cholecalciferol (VITAMIN D3) 125 MCG (5000 UT) CAPS Take 1 capsule by mouth daily.     Coenzyme Q10 (COQ10 GUMMIES ADULT) 50 MG CHEW  Chew 1 tablet by mouth at bedtime.     empagliflozin (JARDIANCE) 10 MG TABS tablet Take 1 tablet (10 mg total) by mouth daily. 30 tablet 1   glipiZIDE (GLUCOTROL) 5 MG tablet Take 5 mg by mouth 2 (two) times daily before a meal.     LANTUS SOLOSTAR 100 UNIT/ML Solostar Pen Inject 24 Units into the skin at bedtime.     metFORMIN (GLUCOPHAGE-XR) 500 MG 24 hr tablet Take 500 mg by mouth 2 (two) times daily.     Misc Natural Products (AIRBORNE ELDERBERRY) CHEW Chew 1 tablet by mouth at bedtime.     Multiple Vitamins-Minerals (MULTIVITAMIN ADULT) CHEW Chew 1 tablet by mouth at bedtime.     nitroGLYCERIN (NITROSTAT) 0.4 MG SL tablet Place under the tongue.     Omega-3 Fatty Acids (FISH OIL PO) Take 1 capsule by mouth daily.     sacubitril-valsartan (ENTRESTO) 24-26 MG Take 1 tablet by mouth 2 (two) times daily. 60 tablet 1   No current facility-administered medications for this visit.    Allergies:   Patient has no known allergies.    Social History:  The patient  reports that he quit smoking about 10 years ago. He has a 50.00 pack-year smoking history. He has never used smokeless tobacco. He reports current alcohol use. He reports previous drug use.   Family History:  The patient's family history includes CAD in his mother.    ROS: All other systems are reviewed and negative. Unless otherwise mentioned in H&P    PHYSICAL EXAM: VS:  There were no vitals taken for this visit. , BMI There is no height or weight on file to calculate BMI. GEN: Well nourished, well developed, in no acute distress HEENT: normal Neck: no JVD, carotid bruits, or masses Cardiac: RRR; 1/6 systolic murmurs, rubs, or gallops,no edema  Respiratory:  Clear to auscultation bilaterally, normal work of breathing GI: soft, nontender, nondistended, + BS MS: no deformity or atrophy Skin: warm and dry, no rash Neuro:  Strength and sensation are intact Psych: euthymic mood, full affect   EKG: Not completed this  office visit  Recent Labs: 06/14/2020: ALT 37; B Natriuretic Peptide 603.0 06/18/2020: BUN 27; Creatinine, Ser 1.38; Hemoglobin 11.6; Platelets 179; Potassium 3.9; Sodium 137    Lipid Panel No results found for: CHOL, TRIG, HDL, CHOLHDL, VLDL, LDLCALC, LDLDIRECT    Wt Readings from Last 3 Encounters:  06/18/20 167 lb 9.6 oz (76 kg)  05/16/20 179 lb (81.2 kg)  04/18/20 175 lb 12.8 oz (79.7 kg)      Other studies Reviewed:  Echocardiogram 06/15/2020 1. Left ventricular ejection fraction, by estimation, is 20 to 25%. Left  ventricular ejection fraction by 3D volume is 25 %. Left ventricular  ejection fraction by 2D MOD biplane is 22.6 %. The left ventricle has  severely decreased function. The left  ventricle demonstrates global hypokinesis. Left ventricular diastolic  parameters are consistent with Grade II diastolic  dysfunction  (pseudonormalization). Elevated left atrial pressure. The E/e' is 3.  2. Right ventricular systolic function is normal. The right ventricular  size is normal. Tricuspid regurgitation signal is inadequate for assessing  PA pressure.  3. Left atrial size was mild to moderately dilated.  4. The mitral valve is grossly normal. Mild to moderate mitral valve  regurgitation. No evidence of mitral stenosis.  5. The aortic valve is tricuspid. Aortic valve regurgitation is not  visualized. No aortic stenosis is present.  6. The inferior vena cava is normal in size with greater than 50%  respiratory variability, suggesting right atrial pressure of 3 mmHg.   Left and Right Cardiac Cath 06/17/2020  Prox RCA lesion is 100% stenosed. Left to right collaterals.  1st Mrg lesion is 50% stenosed. Calcified lesion. Other mild, nonobstructive disease on the left side.  LV end diastolic pressure is normal.  There is no aortic valve stenosis.  Ao sat 98%, PA sat 58%, PA pressure 27/7, mean PA 14 mm Hg; mean PCWP 11 mm Hg; CO 4.11 L/min; CI 2.2   No  significant change from prior cath report.  Medical therapy for CAD and heart failure.   Appears euvolemic by right heart cath.   ASSESSMENT AND PLAN:  1.  Chronic combined  systolic and diastolic heart failure: Recent hospitalization due to decompensation with IV diuresis.  The patient was placed on Entresto and Jardiance.  He was not sent home on Lasix.  He had been without Lasix prior to coming to the hospital and fluid overload.  Creatinine 1.3.  He has no evidence of fluid overload.  His weight is up 2 pounds over the last few days.  He is weighing daily as requested.  Okay to go ahead and give him a prescription for Lasix 20 mg which she can start taking daily.  However, I have advised him if his blood pressure dropped to due to overdiuresis (as he is also on Jardiance) he will need to stop the Lasix.  I would like him to have some at home for fluid retention to avoid rehospitalization.  He is given prescriptions for Entresto (90-day) and Jardiance 10 mg (90 days).  I will have him get a BMET in 2 weeks to evaluate renal function.  He is advised to call us for any significant weight gain, drop in blood pressure, or any new symptoms.  I have reinforced the need to stop Lasix should this occur.  2.  Hypertension: Blood pressure is optimal for current LV function of 20 to 25%.  He will take his blood pressure every day and record this.  If blood pressure drops below 90 systolic he is to report this.  He will remain on Entresto 24/26 mg daily and carvedilol 6.25 mg twice daily.   3.  Coronary artery disease: Cardiac catheterization 06/17/2020 revealed proximal RCA lesion that was 100% stenosed with left-to-right collaterals.  The first marginal lesion was 50% stenosed with calcified lesion and mild nonobstructive disease on the left side.  He is being treated medically with aspirin statin, and continues on heart failure treatment.  3.  Type 2 diabetes: Followed by primary care.   Current  medicines are reviewed at length with the patient today.  I have spent 25 minutes  dedicated to the care of this patient on the date of this encounter to include pre-visit review of records, assessment, management and diagnostic testing,with shared decision making.  Labs/ tests ordered today include: BMET  Phill Myron. Purcell Nails  DNP, ANP, AACC   07/01/2020 4:21 PM    [facility] [address] Office [phone] Fax [phone]  Notice: This dictation was prepared with Dragon dictation along with smaller phrase technology. Any transcriptional errors that result from this process are unintentional and may not be corrected upon review.

## 2020-07-02 ENCOUNTER — Other Ambulatory Visit: Payer: Self-pay

## 2020-07-02 DIAGNOSIS — N189 Chronic kidney disease, unspecified: Secondary | ICD-10-CM | POA: Diagnosis not present

## 2020-07-02 DIAGNOSIS — E875 Hyperkalemia: Secondary | ICD-10-CM | POA: Diagnosis not present

## 2020-07-02 DIAGNOSIS — R7989 Other specified abnormal findings of blood chemistry: Secondary | ICD-10-CM | POA: Diagnosis not present

## 2020-07-05 ENCOUNTER — Other Ambulatory Visit: Payer: Self-pay

## 2020-07-05 ENCOUNTER — Encounter: Payer: Self-pay | Admitting: Adult Health

## 2020-07-05 ENCOUNTER — Ambulatory Visit (INDEPENDENT_AMBULATORY_CARE_PROVIDER_SITE_OTHER): Payer: HMO | Admitting: Adult Health

## 2020-07-05 VITALS — BP 108/70 | HR 70 | Resp 16 | Ht 66.0 in | Wt 171.2 lb

## 2020-07-05 DIAGNOSIS — I10 Essential (primary) hypertension: Secondary | ICD-10-CM | POA: Diagnosis not present

## 2020-07-05 DIAGNOSIS — I5042 Chronic combined systolic (congestive) and diastolic (congestive) heart failure: Secondary | ICD-10-CM

## 2020-07-05 DIAGNOSIS — E7800 Pure hypercholesterolemia, unspecified: Secondary | ICD-10-CM | POA: Diagnosis not present

## 2020-07-05 DIAGNOSIS — I2510 Atherosclerotic heart disease of native coronary artery without angina pectoris: Secondary | ICD-10-CM | POA: Diagnosis not present

## 2020-07-05 MED ORDER — FUROSEMIDE 20 MG PO TABS: 20.0000 mg | ORAL_TABLET | Freq: Every day | ORAL | 3 refills | Status: DC

## 2020-07-05 MED ORDER — EMPAGLIFLOZIN 10 MG PO TABS: 10.0000 mg | ORAL_TABLET | Freq: Every day | ORAL | 3 refills | Status: DC

## 2020-07-05 MED ORDER — SACUBITRIL-VALSARTAN 24-26 MG PO TABS: 1.0000 | ORAL_TABLET | Freq: Two times a day (BID) | ORAL | 3 refills | Status: AC

## 2020-07-05 NOTE — Patient Instructions (Signed)
Medication Instructions:   START furosemide (Lasix) 20mg  daily.  Hold for low blood pressure.  *If you need a refill on your cardiac medications before your next appointment, please call your pharmacy*   Lab Work: Your physician recommends that you return for lab work in: 2 weeks: BMET  If you have labs (blood work) drawn today and your tests are completely normal, you will receive your results only by: Marland Kitchen MyChart Message (if you have MyChart) OR . A paper copy in the mail If you have any lab test that is abnormal or we need to change your treatment, we will call you to review the results.   Follow-Up: At [facility], you and your health needs are our priority.  As part of our continuing mission to provide you with exceptional heart care, we have created designated Provider Care Teams.  These Care Teams include your primary Cardiologist (physician) and Advanced Practice Providers (APPs -  Physician Assistants and Nurse Practitioners) who all work together to provide you with the care you need, when you need it.  We recommend signing up for the patient portal called "MyChart".  Sign up information is provided on this After Visit Summary.  MyChart is used to connect with patients for Virtual Visits (Telemedicine).  Patients are able to view lab/test results, encounter notes, upcoming appointments, etc.  Non-urgent messages can be sent to your provider as well.   To learn more about what you can do with MyChart, go to [email or web address].    Your next appointment:   3 month(s)  The format for your next appointment:   In Person  Provider:   Kirk Ruths, MD

## 2020-07-18 DIAGNOSIS — I10 Essential (primary) hypertension: Secondary | ICD-10-CM | POA: Diagnosis not present

## 2020-07-19 LAB — BASIC METABOLIC PANEL
BUN/Creatinine Ratio: 19 (ref 10–24)
BUN: 30 mg/dL — ABNORMAL HIGH (ref 8–27)
CO2: 23 mmol/L (ref 20–29)
Calcium: 9.2 mg/dL (ref 8.6–10.2)
Chloride: 102 mmol/L (ref 96–106)
Creatinine, Ser: 1.56 mg/dL — ABNORMAL HIGH (ref 0.76–1.27)
GFR calc Af Amer: 53 mL/min/{1.73_m2} — ABNORMAL LOW (ref >59)
GFR calc non Af Amer: 46 mL/min/{1.73_m2} — ABNORMAL LOW (ref >59)
Glucose: 127 mg/dL — ABNORMAL HIGH (ref 65–99)
Potassium: 4.8 mmol/L (ref 3.5–5.2)
Sodium: 142 mmol/L (ref 134–144)

## 2020-08-14 DIAGNOSIS — E785 Hyperlipidemia, unspecified: Secondary | ICD-10-CM | POA: Diagnosis not present

## 2020-08-14 DIAGNOSIS — E1169 Type 2 diabetes mellitus with other specified complication: Secondary | ICD-10-CM | POA: Diagnosis not present

## 2020-08-14 DIAGNOSIS — N189 Chronic kidney disease, unspecified: Secondary | ICD-10-CM | POA: Diagnosis not present

## 2020-08-14 DIAGNOSIS — M109 Gout, unspecified: Secondary | ICD-10-CM | POA: Diagnosis not present

## 2020-08-15 ENCOUNTER — Ambulatory Visit (INDEPENDENT_AMBULATORY_CARE_PROVIDER_SITE_OTHER): Payer: HMO

## 2020-08-15 DIAGNOSIS — I255 Ischemic cardiomyopathy: Secondary | ICD-10-CM | POA: Diagnosis not present

## 2020-08-16 DIAGNOSIS — E785 Hyperlipidemia, unspecified: Secondary | ICD-10-CM | POA: Diagnosis not present

## 2020-08-16 DIAGNOSIS — I10 Essential (primary) hypertension: Secondary | ICD-10-CM | POA: Diagnosis not present

## 2020-08-16 DIAGNOSIS — E1169 Type 2 diabetes mellitus with other specified complication: Secondary | ICD-10-CM | POA: Diagnosis not present

## 2020-08-16 LAB — CUP PACEART REMOTE DEVICE CHECK
Battery Remaining Longevity: 78 mo
Battery Remaining Percentage: 83 %
Brady Statistic RV Percent Paced: 0 %
Date Time Interrogation Session: 20220217030100
HighPow Impedance: 70 Ohm
Implantable Lead Implant Date: 20140523
Implantable Lead Location: 753860
Implantable Lead Model: 296
Implantable Lead Serial Number: 305696
Implantable Pulse Generator Implant Date: 20140523
Lead Channel Impedance Value: 539 Ohm
Lead Channel Pacing Threshold Amplitude: 0.5 V
Lead Channel Pacing Threshold Pulse Width: 0.4 ms
Lead Channel Setting Pacing Amplitude: 2 V
Lead Channel Setting Pacing Pulse Width: 0.4 ms
Lead Channel Setting Sensing Sensitivity: 0.6 mV
Pulse Gen Serial Number: 100436

## 2020-08-21 NOTE — Progress Notes (Signed)
Remote ICD transmission.   

## 2020-09-27 NOTE — Progress Notes (Signed)
HPI: FU coronary artery disease, ischemic cardiomyopathy and ICD. Patient also with peripheral vascular disease.  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  ABIs April 2021.73 on the right and 0.73 on the left. Abdominal ultrasound November 2021 nondiagnostic.  Echocardiogram December 2021 showed ejection fraction 20 to 31%, grade 2 diastolic dysfunction, mild to moderate left atrial enlargement, mild to moderate mitral regurgitation.  Cardiac catheterization December 2021 showed occluded right coronary artery with left-to-right collaterals, 50% first marginal but no other obstructive disease noted.  Pulmonary capillary wedge pressure 11.  CTA December 2021 showed no pulmonary embolus, small right pleural effusion, left lower lobe pulmonary nodule and follow-up recommended in 12 months.  Since last seen he denies dyspnea, chest pain, palpitations or syncope.  Current Outpatient Medications  Medication Sig Dispense Refill  . allopurinol (ZYLOPRIM) 100 MG tablet Take 100 mg by mouth daily.    Marland Kitchen aspirin EC 81 MG tablet Take 81 mg by mouth daily. Swallow whole.    Marland Kitchen atorvastatin (LIPITOR) 80 MG tablet Take 1 tablet (80 mg total) by mouth daily. 90 tablet 3  . carvedilol (COREG) 6.25 MG tablet Take 6.25 mg by mouth 2 (two) times daily with a meal.    . Cholecalciferol (VITAMIN D3) 125 MCG (5000 UT) CAPS Take 1 capsule by mouth daily.    . Coenzyme Q10 (COQ10 GUMMIES ADULT) 50 MG CHEW Chew 1 tablet by mouth at bedtime.    Marland Kitchen glipiZIDE (GLUCOTROL) 5 MG tablet Take 5 mg by mouth 2 (two) times daily before a meal.    . LANTUS SOLOSTAR 100 UNIT/ML Solostar Pen Inject 24 Units into the skin at bedtime.    . metFORMIN (GLUCOPHAGE-XR) 500 MG 24 hr tablet Take 500 mg by mouth 2 (two) times daily.    . Misc Natural Products (AIRBORNE ELDERBERRY) CHEW Chew 1 tablet by mouth at bedtime.    . Multiple Vitamins-Minerals (MULTIVITAMIN ADULT) CHEW Chew 1 tablet by mouth at  bedtime.    . nitroGLYCERIN (NITROSTAT) 0.4 MG SL tablet Place under the tongue.    . Omega-3 Fatty Acids (FISH OIL PO) Take 1 capsule by mouth daily.    . furosemide (LASIX) 20 MG tablet Take 1 tablet (20 mg total) by mouth daily. 90 tablet 3   No current facility-administered medications for this visit.     Past Medical History:  Diagnosis Date  . CAD (coronary artery disease)    a. [facility] notes suggest cardiac cath 2013 showed occluded proximal RCA with left to right collaterals, 20-40% proximal LAD, 70% distal circumflex artery.  . Cardiomyopathy ([place])   . Chronic systolic CHF (congestive heart failure) ([place])   . CKD (chronic kidney disease), stage III ([place])   . Diabetes mellitus without complication ([place])   . Hyperlipidemia   . Hypertension   . NSVT (nonsustained ventricular tachycardia) ([place])    mentioned in device interrogation  . PAD (peripheral artery disease) ([place])     with aortoiliac occlusion (med rx per [facility])  . Pancreatitis     Past Surgical History:  Procedure Laterality Date  . CHOLECYSTECTOMY    . ICD IMPLANT    . LEFT HEART CATH AND CORONARY ANGIOGRAPHY N/A 06/17/2020   Procedure: LEFT HEART CATH AND CORONARY ANGIOGRAPHY;  Surgeon: Jettie Booze, MD;  Location: [place] [facility];  Service: Cardiovascular;  Laterality: N/A;  . RIGHT HEART CATH N/A 06/17/2020   Procedure: RIGHT HEART CATH;  Surgeon: Irish Lack,  Charlann Lange, MD;  Location: [place] [facility];  Service: Cardiovascular;  Laterality: N/A;    Social History   Socioeconomic History  . Marital status: Married    Spouse name: Not on file  . Number of children: 0  . Years of education: Not on file  . Highest education level: Not on file  Occupational History  . Not on file  Tobacco Use  . Smoking status: Former Smoker    Packs/day: 1.00    Years: 50.00    Pack years: 50.00    Quit date: 2012    Years since quitting: 10.2  . Smokeless tobacco: Never Used  . Tobacco comment:  vape  Substance and Sexual Activity  . Alcohol use: Yes    Comment: 1 beer per month  . Drug use: Not Currently  . Sexual activity: Yes  Other Topics Concern  . Not on file  Social History Narrative  . Not on file   Social Determinants of Health   Financial Resource Strain: Not on file  Food Insecurity: Not on file  Transportation Needs: Not on file  Physical Activity: Not on file  Stress: Not on file  Social Connections: Not on file  Intimate Partner Violence: Not on file    Family History  Problem Relation Age of Onset  . CAD Mother     ROS: no fevers or chills, productive cough, hemoptysis, dysphasia, odynophagia, melena, hematochezia, dysuria, hematuria, rash, seizure activity, orthopnea, PND, pedal edema, claudication. Remaining systems are negative.  Physical Exam: Well-developed well-nourished in no acute distress.  Skin is warm and dry.  HEENT is normal.  Neck is supple.  Chest is clear to auscultation with normal expansion.  Cardiovascular exam is regular rate and rhythm.  Abdominal exam nontender or distended. No masses palpated. Extremities show no edema. neuro grossly intact  A/P  1 coronary artery disease-patient doing well with no chest pain.  Continue aspirin and statin.  Most recent catheterization revealed revealed occluded right coronary artery but otherwise nonobstructive coronary disease.  2 ischemic cardiomyopathy-continue carvedilol.  Recent creatinine 1.5.  He is unclear about his home medications.  He will contact us when he is able to review his home medications.  We will likely discontinue lisinopril if he is taking this and begin Entresto 24/26 twice daily.  He may also not require Lasix.  3 previous ICD-needs follow-up with electrophysiology in [facility].  4 hypertension-blood pressure is controlled today.  Continue present medical regimen.  5 hyperlipidemia-continue statin.  6 peripheral vascular disease-patient has some claudication  but not lifestyle altering.  Continue medical therapy.  7 chronic systolic congestive heart failure-he is euvolemic.  Continue diuretics at present dose.  8 pulmonary nodule-needs follow-up CT December 2022.  9 history of tobacco use-previous ultrasound could not exclude aneurysm.  We will likely perform CTA in the future once renal function stable.  Kirk Ruths, MD

## 2020-10-04 ENCOUNTER — Other Ambulatory Visit: Payer: Self-pay

## 2020-10-04 ENCOUNTER — Ambulatory Visit (INDEPENDENT_AMBULATORY_CARE_PROVIDER_SITE_OTHER): Payer: HMO | Admitting: Cardiology

## 2020-10-04 ENCOUNTER — Encounter: Payer: Self-pay | Admitting: Cardiology

## 2020-10-04 VITALS — BP 132/66 | HR 74 | Ht 65.0 in | Wt 171.4 lb

## 2020-10-04 DIAGNOSIS — E7800 Pure hypercholesterolemia, unspecified: Secondary | ICD-10-CM

## 2020-10-04 DIAGNOSIS — I10 Essential (primary) hypertension: Secondary | ICD-10-CM | POA: Diagnosis not present

## 2020-10-04 DIAGNOSIS — I2510 Atherosclerotic heart disease of native coronary artery without angina pectoris: Secondary | ICD-10-CM

## 2020-10-04 DIAGNOSIS — I255 Ischemic cardiomyopathy: Secondary | ICD-10-CM

## 2020-10-04 NOTE — Patient Instructions (Signed)

## 2020-10-10 MED ORDER — CARVEDILOL 12.5 MG PO TABS: 12.5000 mg | ORAL_TABLET | Freq: Two times a day (BID) | ORAL | 3 refills | Status: DC

## 2020-10-18 ENCOUNTER — Telehealth: Payer: Self-pay

## 2020-10-18 NOTE — Telephone Encounter (Signed)
Spoke with pt, will place samples and patient assistance application at the front desk for pick up Monday

## 2020-10-18 NOTE — Telephone Encounter (Signed)
Received a call from patient he stated he is taking Entresto 24/26 mg twice a day.Stated he went to pick up prescription and it is too expensive.Stated he has enough to last for 1 more week.Advised I will send message to [facility] for advice.

## 2020-12-17 DIAGNOSIS — E1169 Type 2 diabetes mellitus with other specified complication: Secondary | ICD-10-CM | POA: Diagnosis not present

## 2020-12-17 DIAGNOSIS — I10 Essential (primary) hypertension: Secondary | ICD-10-CM | POA: Diagnosis not present

## 2020-12-17 DIAGNOSIS — M109 Gout, unspecified: Secondary | ICD-10-CM | POA: Diagnosis not present

## 2020-12-17 DIAGNOSIS — E785 Hyperlipidemia, unspecified: Secondary | ICD-10-CM | POA: Diagnosis not present

## 2021-02-13 ENCOUNTER — Ambulatory Visit (INDEPENDENT_AMBULATORY_CARE_PROVIDER_SITE_OTHER): Payer: HMO

## 2021-02-13 DIAGNOSIS — I255 Ischemic cardiomyopathy: Secondary | ICD-10-CM

## 2021-02-14 LAB — CUP PACEART REMOTE DEVICE CHECK
Battery Remaining Longevity: 72 mo
Battery Remaining Percentage: 77 %
Brady Statistic RV Percent Paced: 0 %
Date Time Interrogation Session: 20220818183700
HighPow Impedance: 67 Ohm
Implantable Lead Implant Date: 20140523
Implantable Lead Location: 753860
Implantable Lead Model: 296
Implantable Lead Serial Number: 305696
Implantable Pulse Generator Implant Date: 20140523
Lead Channel Impedance Value: 538 Ohm
Lead Channel Pacing Threshold Amplitude: 0.5 V
Lead Channel Pacing Threshold Pulse Width: 0.4 ms
Lead Channel Setting Pacing Amplitude: 2 V
Lead Channel Setting Pacing Pulse Width: 0.4 ms
Lead Channel Setting Sensing Sensitivity: 0.6 mV
Pulse Gen Serial Number: 100436

## 2021-03-05 NOTE — Progress Notes (Signed)
Remote ICD transmission.   

## 2021-04-14 DIAGNOSIS — E119 Type 2 diabetes mellitus without complications: Secondary | ICD-10-CM | POA: Diagnosis not present

## 2021-04-14 DIAGNOSIS — M109 Gout, unspecified: Secondary | ICD-10-CM | POA: Diagnosis not present

## 2021-04-14 DIAGNOSIS — R5383 Other fatigue: Secondary | ICD-10-CM | POA: Diagnosis not present

## 2021-04-14 DIAGNOSIS — I10 Essential (primary) hypertension: Secondary | ICD-10-CM | POA: Diagnosis not present

## 2021-04-14 DIAGNOSIS — D649 Anemia, unspecified: Secondary | ICD-10-CM | POA: Diagnosis not present

## 2021-04-14 DIAGNOSIS — Z0000 Encounter for general adult medical examination without abnormal findings: Secondary | ICD-10-CM | POA: Diagnosis not present

## 2021-04-14 DIAGNOSIS — Z125 Encounter for screening for malignant neoplasm of prostate: Secondary | ICD-10-CM | POA: Diagnosis not present

## 2021-04-14 DIAGNOSIS — E785 Hyperlipidemia, unspecified: Secondary | ICD-10-CM | POA: Diagnosis not present

## 2021-04-14 DIAGNOSIS — E1169 Type 2 diabetes mellitus with other specified complication: Secondary | ICD-10-CM | POA: Diagnosis not present

## 2021-04-22 DIAGNOSIS — L821 Other seborrheic keratosis: Secondary | ICD-10-CM | POA: Diagnosis not present

## 2021-04-22 DIAGNOSIS — D492 Neoplasm of unspecified behavior of bone, soft tissue, and skin: Secondary | ICD-10-CM | POA: Diagnosis not present

## 2021-04-22 DIAGNOSIS — L989 Disorder of the skin and subcutaneous tissue, unspecified: Secondary | ICD-10-CM | POA: Diagnosis not present

## 2021-05-02 DIAGNOSIS — E1142 Type 2 diabetes mellitus with diabetic polyneuropathy: Secondary | ICD-10-CM | POA: Diagnosis not present

## 2021-05-15 ENCOUNTER — Ambulatory Visit (INDEPENDENT_AMBULATORY_CARE_PROVIDER_SITE_OTHER): Payer: HMO

## 2021-05-15 DIAGNOSIS — I255 Ischemic cardiomyopathy: Secondary | ICD-10-CM | POA: Diagnosis not present

## 2021-05-16 LAB — CUP PACEART REMOTE DEVICE CHECK
Battery Remaining Longevity: 72 mo
Battery Remaining Percentage: 74 %
Brady Statistic RV Percent Paced: 0 %
Date Time Interrogation Session: 20221118005600
HighPow Impedance: 68 Ohm
Implantable Lead Implant Date: 20140523
Implantable Lead Location: 753860
Implantable Lead Model: 296
Implantable Lead Serial Number: 305696
Implantable Pulse Generator Implant Date: 20140523
Lead Channel Impedance Value: 536 Ohm
Lead Channel Pacing Threshold Amplitude: 0.5 V
Lead Channel Pacing Threshold Pulse Width: 0.4 ms
Lead Channel Setting Pacing Amplitude: 2 V
Lead Channel Setting Pacing Pulse Width: 0.4 ms
Lead Channel Setting Sensing Sensitivity: 0.6 mV
Pulse Gen Serial Number: 100436

## 2021-05-26 NOTE — Progress Notes (Signed)
Remote ICD transmission.   

## 2021-06-02 ENCOUNTER — Encounter: Payer: Self-pay | Admitting: *Deleted

## 2021-06-06 IMAGING — DX DG CHEST 1V
1 series · 1 of 1 positions shown · non-contrast
Comparison: None.

CLINICAL DATA: Chest pain, chest tightness with shortness of
breath.

EXAM:
CHEST  1 VIEW.  AP portable semi erect.

[chest ap]
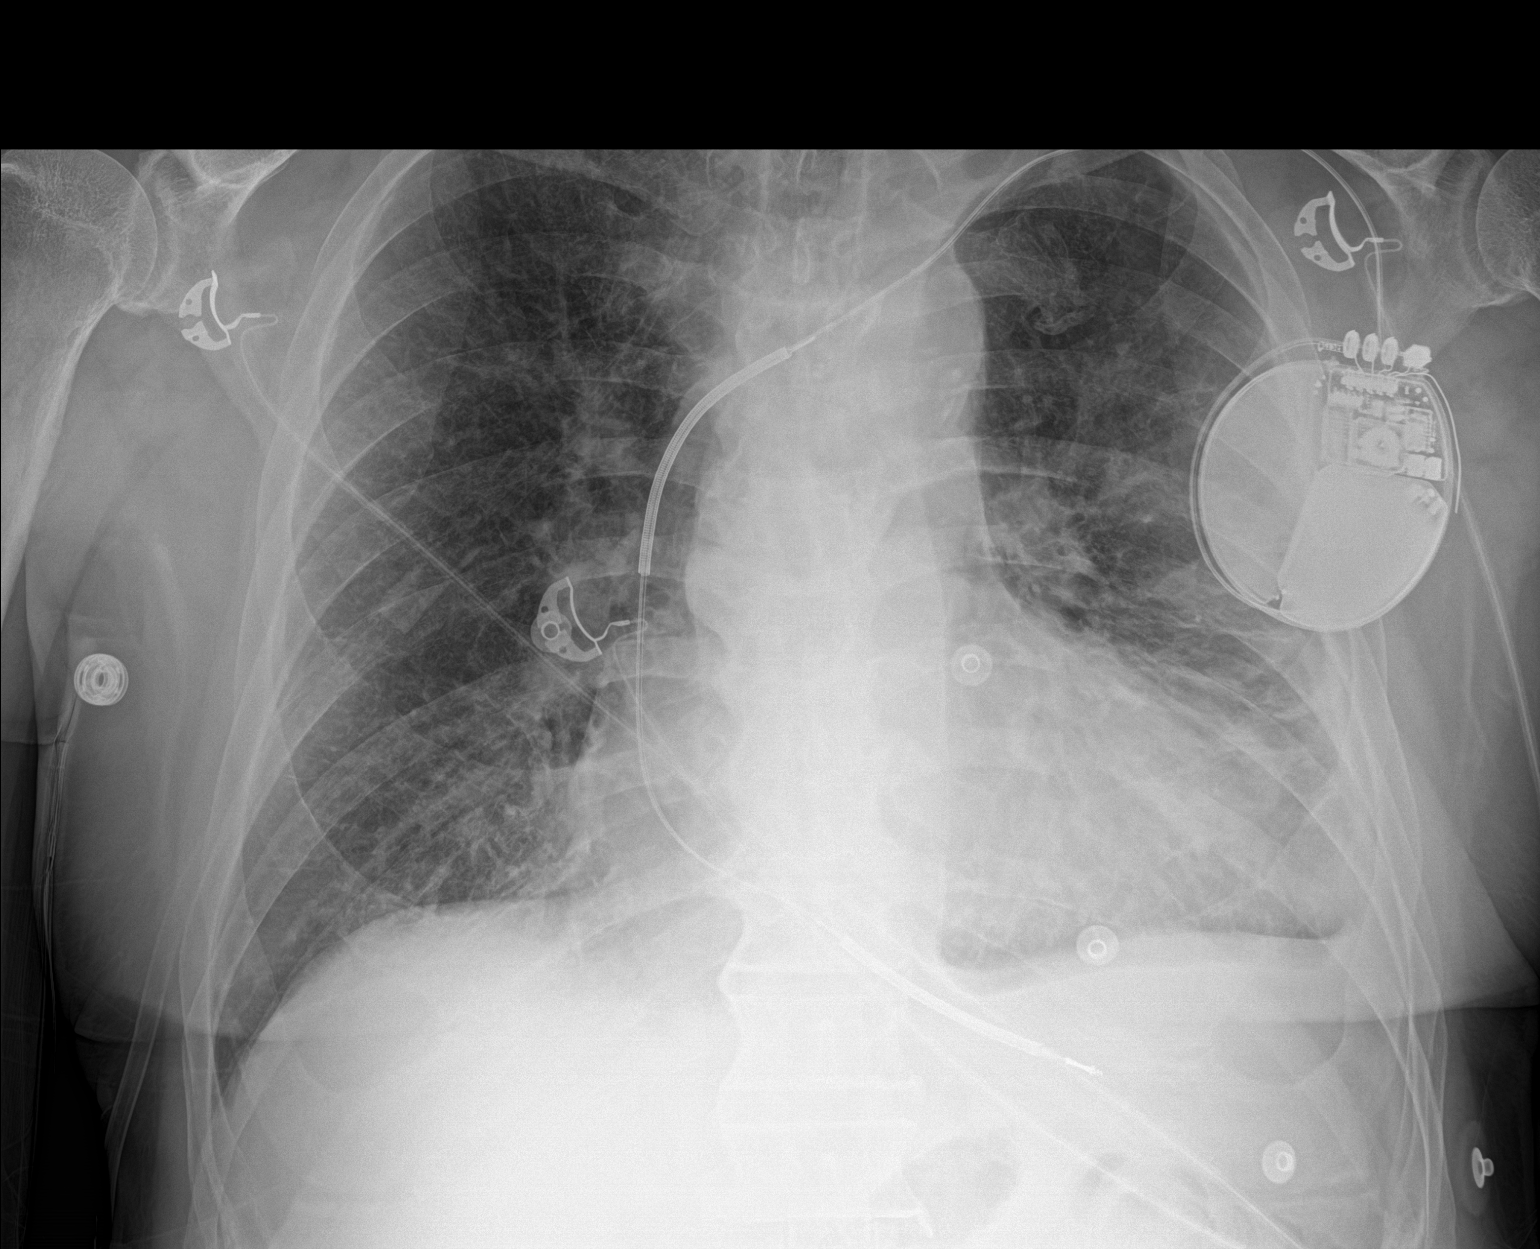

[1 of 1 positions shown; findings below may reference images not displayed]

FINDINGS: Single lead left chest wall cardiac defibrillator.

The heart size and mediastinal contours are within normal limits.
Aortic arch calcification.

Streaky left mid lung airspace opacity. No pulmonary edema. Trace
left pleural effusion. No pneumothorax.

No acute osseous abnormality. Multilevel degenerative changes of the
spine.
IMPRESSION: Streaky left mid lung airspace opacity and trace left pleural
effusion suggestive of infection/inflammation. Followup PA and
lateral chest X-ray is recommended in 3-4 weeks to ensure resolution
and exclude underlying malignancy.

## 2021-06-07 IMAGING — CT CT ANGIO CHEST
2 of 7 series · 17 of 46 positions shown · IV contrast (APPLIED)
Comparison: Chest radiograph from one day prior.

CLINICAL DATA: Chest pain and dyspnea.  Inpatient.

EXAM:
CT ANGIOGRAPHY CHEST WITH CONTRAST
TECHNIQUE: Multidetector CT imaging of the chest was performed using the
standard protocol during bolus administration of intravenous
contrast. Multiplanar CT image reconstructions and MIPs were
obtained to evaluate the vascular anatomy.
CONTRAST:  75mL OMNIPAQUE IOHEXOL 350 MG/ML SOLN

[Series 7: thins · axial · 0.68mm/px · z∈[+986,+1247]mm · 14 of 420 slices shown]
[im 24/420  lung]
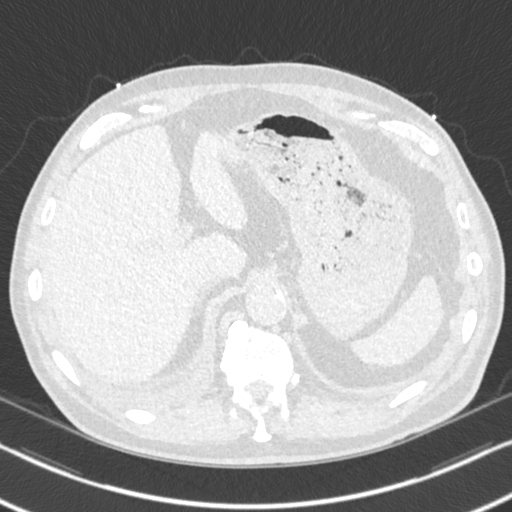
[im 47/420  soft-tissue]
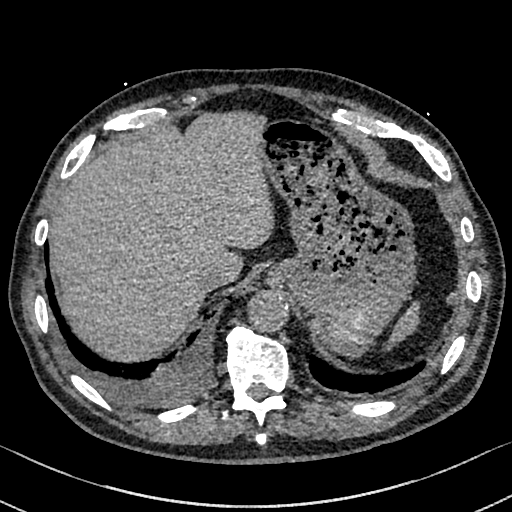
[im 94/420  lung]
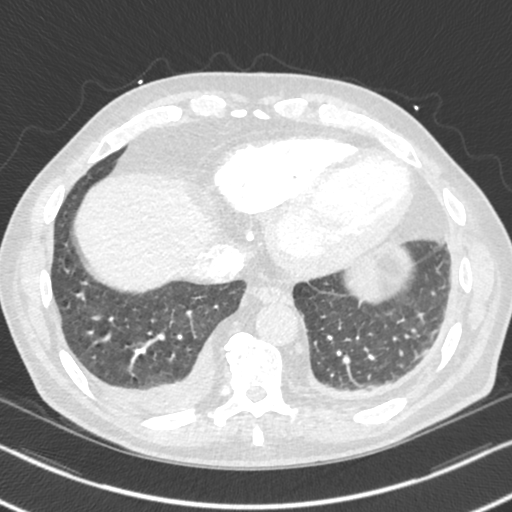
[im 117/420  soft-tissue]
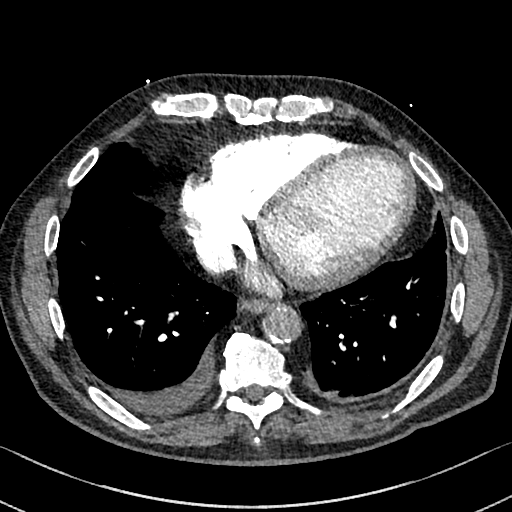
[im 140/420  lung]
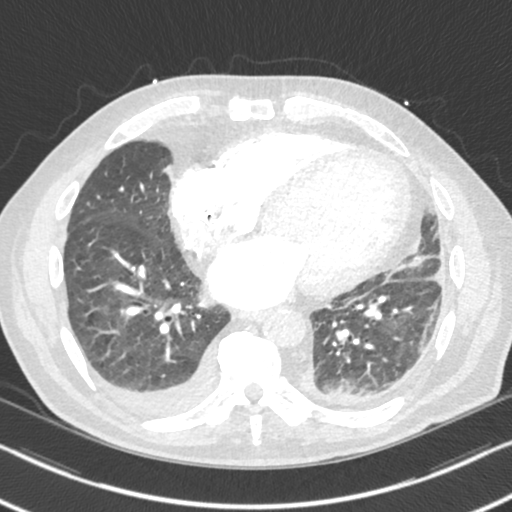
[im 163/420  soft-tissue]
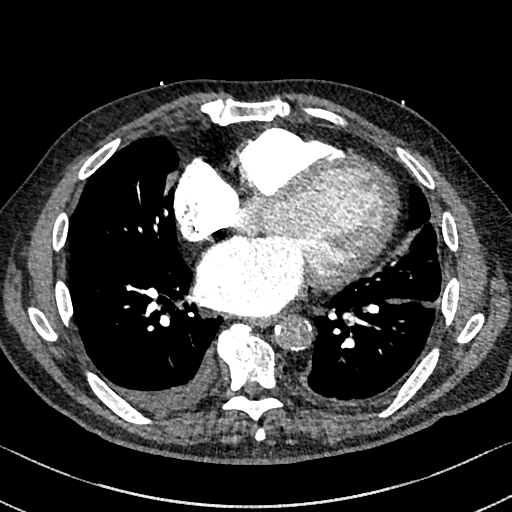
[im 187/420  lung]
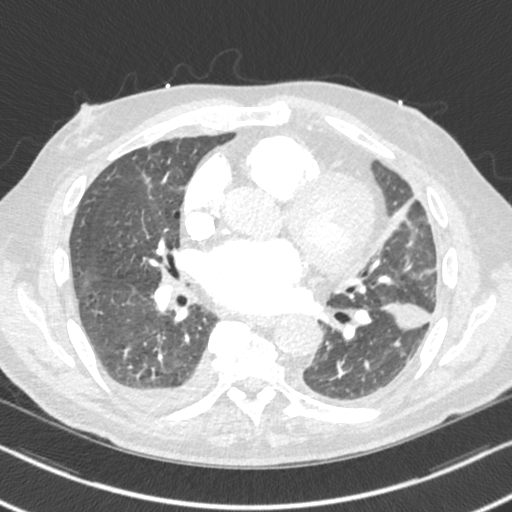
[im 233/420  soft-tissue]
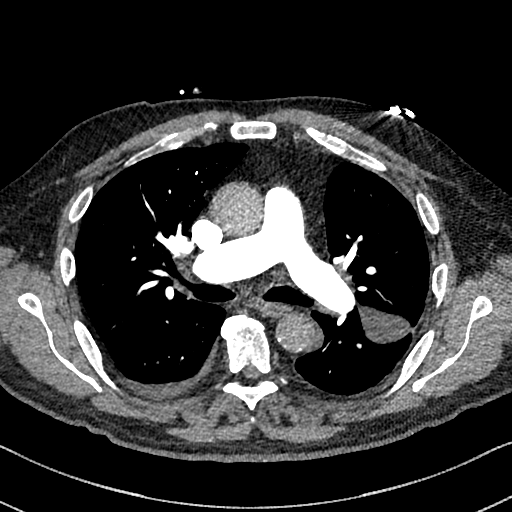
[im 257/420  lung]
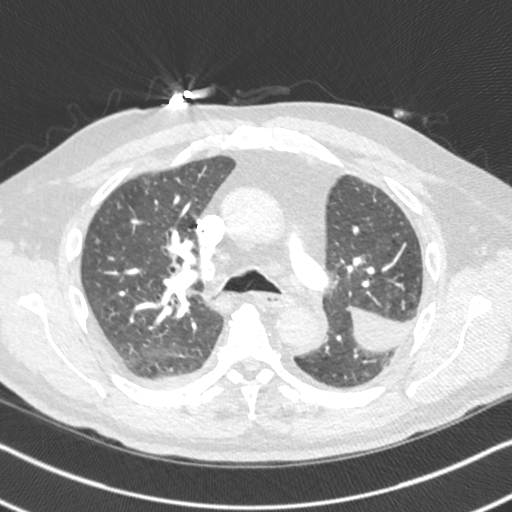
[im 280/420  soft-tissue]
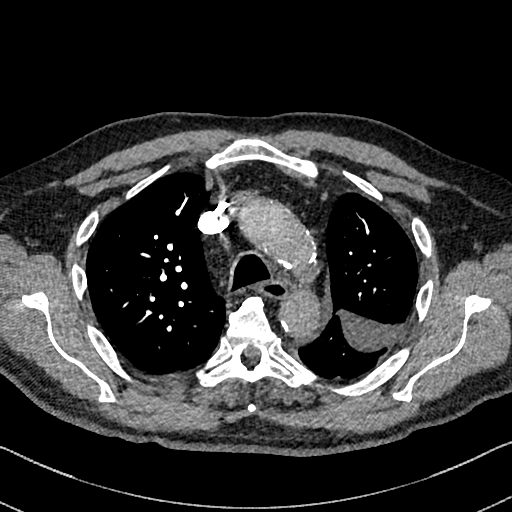
[im 303/420  lung]
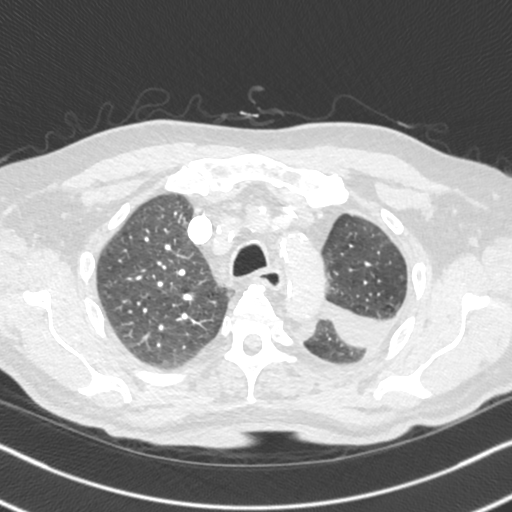
[im 326/420  soft-tissue]
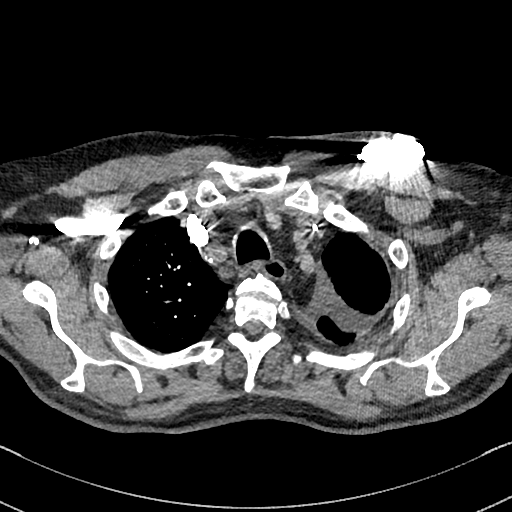
[im 373/420  lung]
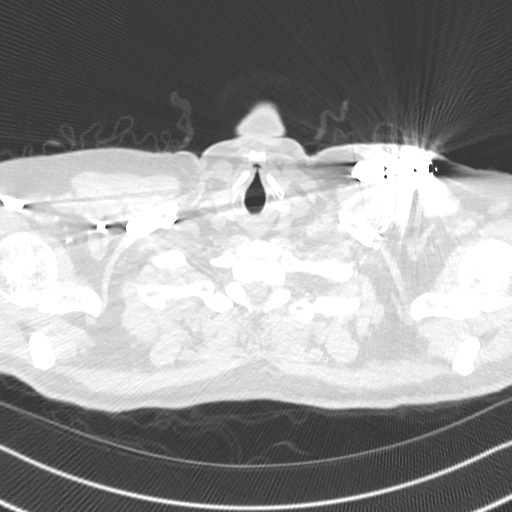
[im 396/420  soft-tissue]
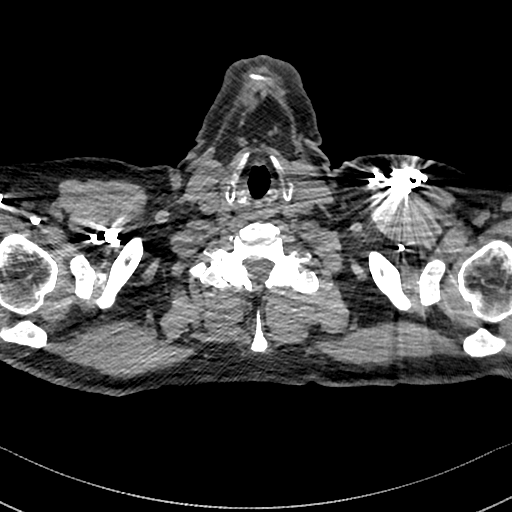

[Series 8: cor · coronal · 0.60mm/px · 3 of 127 slices shown]
[im 32/127  soft-tissue]
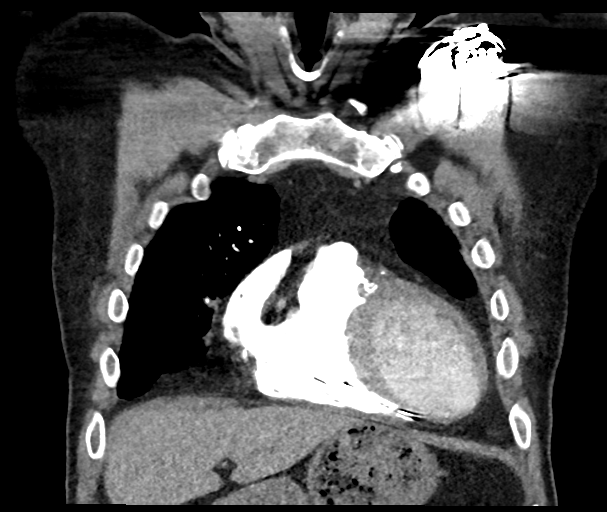
[im 64/127  soft-tissue]
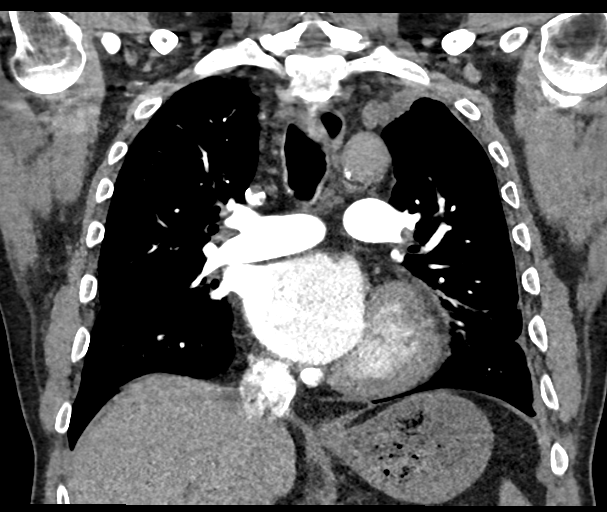
[im 95/127  soft-tissue]
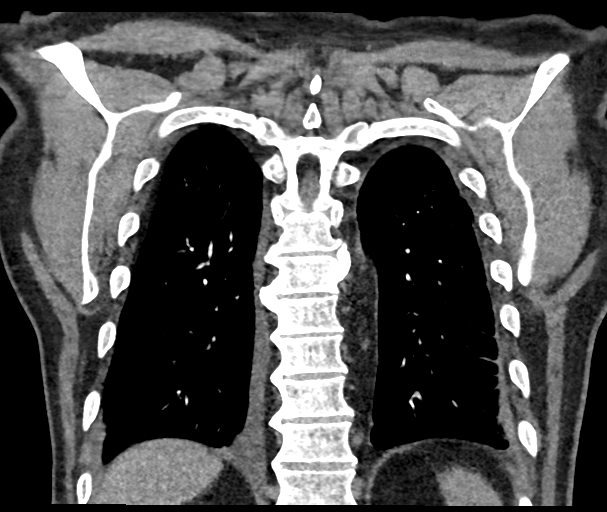

[17 of 46 positions shown; findings below may reference images not displayed]

FINDINGS: Cardiovascular: The study is high quality for the evaluation of
pulmonary embolism. There are no filling defects in the central,
lobar, segmental or subsegmental pulmonary artery branches to
suggest acute pulmonary embolism. Atherosclerotic nonaneurysmal
thoracic aorta. Normal caliber pulmonary arteries. Mild
cardiomegaly. No significant pericardial fluid/thickening. Left
anterior descending and left circumflex coronary atherosclerosis.
Single lead left subclavian ICD with lead tip in right ventricular
apex.

Mediastinum/Nodes: No discrete thyroid nodules. Unremarkable
esophagus. No pathologically enlarged axillary, mediastinal or hilar
lymph nodes.

Lungs/Pleura: No pneumothorax. Small dependent right pleural
effusion. Small loculated left pleural effusion along the left major
fissure. Mild centrilobular and paraseptal emphysema. No acute
consolidative airspace disease or lung masses. Subpleural 5 mm solid
pulmonary nodule in the posterior aspect of the superior segment
left lower lobe (series 6/image 52). No additional significant
pulmonary nodules. No central airway stenoses. Scattered small
parenchymal bands in the medial right middle lobe, lingula and
dependent left lower lobe.

Upper abdomen: No acute abnormality.

Musculoskeletal: No aggressive appearing focal osseous lesions.
Moderate thoracic spondylosis.

Review of the MIP images confirms the above findings.
IMPRESSION: 1. No pulmonary embolism.
2. Mild cardiomegaly. Two-vessel coronary atherosclerosis.
3. Small dependent right pleural effusion. Small loculated left
pleural effusion along the left major fissure.
4. Scattered small parenchymal bands in the mid to lower lungs,
compatible with mild scarring versus atelectasis.
5. Subpleural 5 mm solid pulmonary nodule in the posterior aspect of
the superior segment left lower lobe. Follow-up chest CT recommended
in 12 months in this high risk patient.This recommendation follows
the consensus statement: Guidelines for Management of Incidental
Pulmonary Nodules Detected on CT Images: From the [HOSPITAL]
6. Aortic Atherosclerosis (EZW8E-D33.3) and Emphysema (EZW8E-LBM.L).

## 2021-06-16 ENCOUNTER — Other Ambulatory Visit: Payer: Self-pay | Admitting: Unknown Physician Specialty

## 2021-06-16 DIAGNOSIS — R911 Solitary pulmonary nodule: Secondary | ICD-10-CM

## 2021-06-19 ENCOUNTER — Other Ambulatory Visit: Payer: Self-pay | Admitting: Cardiology

## 2021-06-19 DIAGNOSIS — E7800 Pure hypercholesterolemia, unspecified: Secondary | ICD-10-CM

## 2021-07-10 ENCOUNTER — Other Ambulatory Visit: Payer: HMO

## 2021-07-10 ENCOUNTER — Other Ambulatory Visit: Payer: Self-pay | Admitting: Physician Assistant

## 2021-07-29 ENCOUNTER — Other Ambulatory Visit: Payer: Self-pay | Admitting: Physician Assistant

## 2021-08-05 ENCOUNTER — Other Ambulatory Visit: Payer: Self-pay | Admitting: Physician Assistant

## 2021-08-06 ENCOUNTER — Telehealth: Payer: Self-pay | Admitting: Cardiology

## 2021-08-06 MED ORDER — SACUBITRIL-VALSARTAN 24-26 MG PO TABS: 1.0000 | ORAL_TABLET | Freq: Two times a day (BID) | ORAL | 3 refills | Status: DC

## 2021-08-06 NOTE — Telephone Encounter (Signed)
Pt c/o medication issue:  1. Name of Medication: entresto 24 mg- 26 mg  2. How are you currently taking this medication (dosage and times per day)? 1 tablet twice a day  3. Are you having a reaction (difficulty breathing--STAT)? no  4. What is your medication issue? Patient states his refill was denied and does not know why.

## 2021-08-06 NOTE — Telephone Encounter (Signed)
Spoke with pt, Refill sent to the pharmacy electronically.  

## 2021-08-14 ENCOUNTER — Ambulatory Visit (INDEPENDENT_AMBULATORY_CARE_PROVIDER_SITE_OTHER): Payer: HMO

## 2021-08-14 DIAGNOSIS — I255 Ischemic cardiomyopathy: Secondary | ICD-10-CM | POA: Diagnosis not present

## 2021-08-14 LAB — CUP PACEART REMOTE DEVICE CHECK
Battery Remaining Longevity: 66 mo
Battery Remaining Percentage: 71 %
Brady Statistic RV Percent Paced: 0 %
Date Time Interrogation Session: 20230216031200
HighPow Impedance: 63 Ohm
Implantable Lead Implant Date: 20140523
Implantable Lead Location: 753860
Implantable Lead Model: 296
Implantable Lead Serial Number: 305696
Implantable Pulse Generator Implant Date: 20140523
Lead Channel Impedance Value: 508 Ohm
Lead Channel Pacing Threshold Amplitude: 0.5 V
Lead Channel Pacing Threshold Pulse Width: 0.4 ms
Lead Channel Setting Pacing Amplitude: 2 V
Lead Channel Setting Pacing Pulse Width: 0.4 ms
Lead Channel Setting Sensing Sensitivity: 0.6 mV
Pulse Gen Serial Number: 100436

## 2021-08-19 NOTE — Progress Notes (Signed)
Remote ICD transmission.   

## 2021-11-09 ENCOUNTER — Other Ambulatory Visit: Payer: Self-pay | Admitting: Cardiology

## 2021-11-13 ENCOUNTER — Ambulatory Visit (INDEPENDENT_AMBULATORY_CARE_PROVIDER_SITE_OTHER): Payer: HMO

## 2021-11-13 DIAGNOSIS — I255 Ischemic cardiomyopathy: Secondary | ICD-10-CM | POA: Diagnosis not present

## 2021-11-13 LAB — CUP PACEART REMOTE DEVICE CHECK
Battery Remaining Longevity: 66 mo
Battery Remaining Percentage: 67 %
Brady Statistic RV Percent Paced: 0 %
Date Time Interrogation Session: 20230518030100
HighPow Impedance: 66 Ohm
Implantable Lead Implant Date: 20140523
Implantable Lead Location: 753860
Implantable Lead Model: 296
Implantable Lead Serial Number: 305696
Implantable Pulse Generator Implant Date: 20140523
Lead Channel Impedance Value: 532 Ohm
Lead Channel Pacing Threshold Amplitude: 0.5 V
Lead Channel Pacing Threshold Pulse Width: 0.4 ms
Lead Channel Setting Pacing Amplitude: 2 V
Lead Channel Setting Pacing Pulse Width: 0.4 ms
Lead Channel Setting Sensing Sensitivity: 0.6 mV
Pulse Gen Serial Number: 100436

## 2021-11-25 NOTE — Progress Notes (Signed)
Remote ICD transmission.   

## 2021-12-16 ENCOUNTER — Other Ambulatory Visit: Payer: Self-pay

## 2021-12-16 ENCOUNTER — Telehealth: Payer: Self-pay | Admitting: Cardiology

## 2021-12-16 MED ORDER — EMPAGLIFLOZIN 10 MG PO TABS: 10.0000 mg | ORAL_TABLET | Freq: Every day | ORAL | 0 refills | Status: DC

## 2021-12-16 NOTE — Telephone Encounter (Signed)
Set aside for patient entresto 24-26 sample bottle with patient assistance application. Refilled Jardiance. Patient has appointment with DrStanford Breed on 03/04/22.

## 2021-12-16 NOTE — Telephone Encounter (Signed)
Pt c/o medication issue:  1. Name of Medication:  JARDIANCE 10 MG TABS tablet sacubitril-valsartan (ENTRESTO) 24-26 MG  2. How are you currently taking this medication (dosage and times per day)? No   3. Are you having a reaction (difficulty breathing--STAT)? No   4. What is your medication issue? Patient is calling stating these medications are too expensive and is requesting assistance with the cost as well as samples if available. Patient is not currently out of the medication.

## 2022-01-05 ENCOUNTER — Telehealth: Payer: Self-pay | Admitting: Pharmacist

## 2022-01-05 NOTE — Telephone Encounter (Signed)
Patient assistance application for Jardiance faxed to [facility]

## 2022-02-19 NOTE — Progress Notes (Deleted)
HPI: FU coronary artery disease, ischemic cardiomyopathy and ICD. Patient also with peripheral vascular disease.  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  ABIs April 2021 .73 on the right and 0.73 on the left. Abdominal ultrasound November 2021 nondiagnostic.  Echocardiogram December 2021 showed ejection fraction 20 to 44%, grade 2 diastolic dysfunction, mild to moderate left atrial enlargement, mild to moderate mitral regurgitation.  Cardiac catheterization December 2021 showed occluded right coronary artery with left-to-right collaterals, 50% first marginal but no other obstructive disease noted.  Pulmonary capillary wedge pressure 11.  CTA December 2021 showed no pulmonary embolus, small right pleural effusion, left lower lobe pulmonary nodule and follow-up recommended in 12 months.  Since last seen   Current Outpatient Medications  Medication Sig Dispense Refill   allopurinol (ZYLOPRIM) 100 MG tablet Take 100 mg by mouth daily.     aspirin EC 81 MG tablet Take 81 mg by mouth daily. Swallow whole.     atorvastatin (LIPITOR) 80 MG tablet TAKE ONE TABLET BY MOUTH DAILY 90 tablet 3   carvedilol (COREG) 12.5 MG tablet TAKE ONE TABLET BY MOUTH TWICE A DAY WITH A MEAL 180 tablet 3   Cholecalciferol (VITAMIN D3) 125 MCG (5000 UT) CAPS Take 1 capsule by mouth daily.     Coenzyme Q10 (COQ10 GUMMIES ADULT) 50 MG CHEW Chew 1 tablet by mouth at bedtime.     empagliflozin (JARDIANCE) 10 MG TABS tablet Take 1 tablet (10 mg total) by mouth daily. 90 tablet 0   furosemide (LASIX) 20 MG tablet TAKE ONE TABLET BY MOUTH DAILY 90 tablet 3   glipiZIDE (GLUCOTROL) 5 MG tablet Take 5 mg by mouth 2 (two) times daily before a meal.     LANTUS SOLOSTAR 100 UNIT/ML Solostar Pen Inject 24 Units into the skin at bedtime.     metFORMIN (GLUCOPHAGE-XR) 500 MG 24 hr tablet Take 500 mg by mouth 2 (two) times daily.     Misc Natural Products (AIRBORNE ELDERBERRY) CHEW Chew 1 tablet by  mouth at bedtime.     Multiple Vitamins-Minerals (MULTIVITAMIN ADULT) CHEW Chew 1 tablet by mouth at bedtime.     nitroGLYCERIN (NITROSTAT) 0.4 MG SL tablet Place under the tongue.     Omega-3 Fatty Acids (FISH OIL PO) Take 1 capsule by mouth daily.     sacubitril-valsartan (ENTRESTO) 24-26 MG Take 1 tablet by mouth 2 (two) times daily. 180 tablet 3   No current facility-administered medications for this visit.     Past Medical History:  Diagnosis Date   CAD (coronary artery disease)    a. [facility] notes suggest cardiac cath 2013 showed occluded proximal RCA with left to right collaterals, 20-40% proximal LAD, 70% distal circumflex artery.   Cardiomyopathy ([place])    Chronic systolic CHF (congestive heart failure) (HCC)    CKD (chronic kidney disease), stage III (HCC)    Diabetes mellitus without complication (HCC)    Hyperlipidemia    Hypertension    NSVT (nonsustained ventricular tachycardia) ([place])    mentioned in device interrogation   PAD (peripheral artery disease) ([place])     with aortoiliac occlusion (med rx per [facility])   Pancreatitis     Past Surgical History:  Procedure Laterality Date   CHOLECYSTECTOMY     ICD IMPLANT     LEFT HEART CATH AND CORONARY ANGIOGRAPHY N/A 06/17/2020   Procedure: LEFT HEART CATH AND CORONARY ANGIOGRAPHY;  Surgeon: Jettie Booze, MD;  Location:  [place] [facility];  Service: Cardiovascular;  Laterality: N/A;   RIGHT HEART CATH N/A 06/17/2020   Procedure: RIGHT HEART CATH;  Surgeon: Jettie Booze, MD;  Location: [place] [facility];  Service: Cardiovascular;  Laterality: N/A;    Social History   Socioeconomic History   Marital status: Married    Spouse name: Not on file   Number of children: 0   Years of education: Not on file   Highest education level: Not on file  Occupational History   Not on file  Tobacco Use   Smoking status: Former    Packs/day: 1.00    Years: 50.00    Total pack years: 50.00    Types:  Cigarettes    Quit date: 2012    Years since quitting: 11.6   Smokeless tobacco: Never   Tobacco comments:    vape  Substance and Sexual Activity   Alcohol use: Yes    Comment: 1 beer per month   Drug use: Not Currently   Sexual activity: Yes  Other Topics Concern   Not on file  Social History Narrative   Not on file   Social Determinants of Health   Financial Resource Strain: Not on file  Food Insecurity: No Food Insecurity (06/14/2019)   Hunger Vital Sign    Worried About Running Out of Food in the Last Year: Never true    Ran Out of Food in the Last Year: Never true  Transportation Needs: No Transportation Needs (06/14/2019)   PRAPARE - Hydrologist (Medical): No    Lack of Transportation (Non-Medical): No  Physical Activity: Not on file  Stress: Not on file  Social Connections: Not on file  Intimate Partner Violence: Not on file    Family History  Problem Relation Age of Onset   CAD Mother     ROS: no fevers or chills, productive cough, hemoptysis, dysphasia, odynophagia, melena, hematochezia, dysuria, hematuria, rash, seizure activity, orthopnea, PND, pedal edema, claudication. Remaining systems are negative.  Physical Exam: Well-developed well-nourished in no acute distress.  Skin is warm and dry.  HEENT is normal.  Neck is supple.  Chest is clear to auscultation with normal expansion.  Cardiovascular exam is regular rate and rhythm.  Abdominal exam nontender or distended. No masses palpated. Extremities show no edema. neuro grossly intact  ECG- personally reviewed  A/P  1 coronary artery disease-patient denies chest pain.  Continue medical therapy with aspirin and statin.  2 ischemic cardiomyopathy-  3 ICD-follow-up electrophysiology.  4 hypertension-patient's blood pressure is controlled.  Continue present medications and follow.  5 hyperlipidemia-continue statin.  6 chronic systolic congestive heart failure-we will  continue diuretics at present dose.  He appears to be euvolemic.  7 peripheral vascular disease-continue aspirin and statin.  8 history of pulmonary nodules-arrange follow-up noncontrast chest CT (scheduled at last office visit but has not been performed).  9 history of tobacco use-previous abdominal ultrasound nondiagnostic.  We will arrange cardiac CTA to rule out aneurysm.  Kirk Ruths, MD

## 2022-03-04 ENCOUNTER — Ambulatory Visit: Payer: HMO | Admitting: Cardiology

## 2022-03-04 ENCOUNTER — Encounter: Payer: HMO | Admitting: Cardiology

## 2022-04-28 NOTE — Progress Notes (Unsigned)
Cardiology Clinic Note   Patient Name: Richard Flynn Date of Encounter: 04/30/2022  Primary Care Provider:  Finis Bud, MD Primary Cardiologist:  Kirk Ruths, MD  Patient Profile    [name] is a 66 year-old male with a past medical history of CAD, ischemic cardiomyopathy with presence of ICD, chronic systolic heart failure, PAD, CKD stage III, T2DM, hypertension, and hyperlipidemia who presents to the clinic today for follow-up of chronic cardiac conditions.  Past Medical History    Past Medical History:  Diagnosis Date   CAD (coronary artery disease)    a. [facility] notes suggest cardiac cath 2013 showed occluded proximal RCA with left to right collaterals, 20-40% proximal LAD, 70% distal circumflex artery.   Cardiomyopathy ([place])    Chronic systolic CHF (congestive heart failure) (HCC)    CKD (chronic kidney disease), stage III (HCC)    Diabetes mellitus without complication (HCC)    Hyperlipidemia    Hypertension    NSVT (nonsustained ventricular tachycardia) ([place])    mentioned in device interrogation   PAD (peripheral artery disease) ([place])     with aortoiliac occlusion (med rx per [facility])   Pancreatitis    Past Surgical History:  Procedure Laterality Date   CHOLECYSTECTOMY     ICD IMPLANT     LEFT HEART CATH AND CORONARY ANGIOGRAPHY N/A 06/17/2020   Procedure: LEFT HEART CATH AND CORONARY ANGIOGRAPHY;  Surgeon: Jettie Booze, MD;  Location: [place] [facility];  Service: Cardiovascular;  Laterality: N/A;   RIGHT HEART CATH N/A 06/17/2020   Procedure: RIGHT HEART CATH;  Surgeon: Jettie Booze, MD;  Location: [place] [facility];  Service: Cardiovascular;  Laterality: N/A;    Allergies  No Known Allergies  History of Present Illness    Mr. Tasker has a past medical history of: CAD. Left heart catheterization 05/02/2012 done at [facility]: 100% proximal right coronary artery with good collateral flow, 70% very distal circumflex  lesion.  Medically treated. Right and left heart catheterization 06/17/2020: Proximal RCA 100% stenosed with left-to-right collaterals. First marginal 50% calcified lesion.  Mild nonobstructive disease on the left.  AO sat 98%, PA sat 58%, PA pressure 27/7, mean PA 14 mmHg, mean PCWP 11 mmHg, CO 4.11 L/min, CL 2.2.  No significant change from prior cath in 2013.  Medical therapy for CAD. Ischemic cardiomyopathy. Per note from [facility] cardiology 05/18/2012: EF 25%. Presence of ICD 11/18/2012 at [facility].  Last remote device check 11/13/2021: Normal device function.  Battery and lead parameters are stable. PAD. Records from [facility] indicate bilateral iliac artery occlusion.  Chronic systolic heart failure. Echo 06/15/2020: EF 20 to 25%.  Severely decreased left ventricular function.  Left ventricular global hypokinesis.  Grade II DD.  Moderately dilated left atrium.  Mild to moderate mitral valve regurgitation. CKD stage III. CMP 01/19/2022 from [facility]: BUN 20, creatinine 1.43, eGFR 54. Hypertension. Hyperlipidemia. Last lipid panel 01/19/2022 from [facility]: LDL 54, HDL 28, triglycerides 206, total cholesterol 116. T2DM. A1c 01/19/2022 from [facility]: 7.6.  Patient was last seen in the office by Dr. Stanford Breed on 10/04/2020.  At that visit he was doing well without chest pain or shortness of breath.  The plan was to review home medications with the patient after visit as he was unclear which medications he was taking.  Was found to be taking lisinopril he would be changed to [facility] 24/26 twice daily.  He was set up with EP to  follow him for his ICD.  He was not experiencing lifestyle altering claudication so medical therapy was continued.  Today, patient reports he is doing well.  He continues to have some mild claudication but actually feels its better than it was when he was seen in April 2022.  He has filled out patient assistance forms for Jardiance.  He is receiving  assistance for Entresto.  He requests samples of possible today for both.  He denies chest pain, shortness of breath, DOE, lower extremity edema or palpitations.  Patient's BP is a little soft today at 100/68.  He reports this is the lowest it has been.  He denies dizziness or lightheadedness.  He will monitor it at home and call if he sees BPs trending lower than this.    Home Medications    Current Meds  Medication Sig   allopurinol (ZYLOPRIM) 100 MG tablet Take 100 mg by mouth daily.   aspirin EC 81 MG tablet Take 81 mg by mouth daily. Swallow whole.   atorvastatin (LIPITOR) 80 MG tablet TAKE ONE TABLET BY MOUTH DAILY   carvedilol (COREG) 12.5 MG tablet TAKE ONE TABLET BY MOUTH TWICE A DAY WITH A MEAL   Cholecalciferol (VITAMIN D3) 125 MCG (5000 UT) CAPS Take 1 capsule by mouth daily.   Coenzyme Q10 (COQ10 GUMMIES ADULT) 50 MG CHEW Chew 1 tablet by mouth at bedtime.   empagliflozin (JARDIANCE) 10 MG TABS tablet Take 1 tablet (10 mg total) by mouth daily.   furosemide (LASIX) 20 MG tablet TAKE ONE TABLET BY MOUTH DAILY   glipiZIDE (GLUCOTROL) 5 MG tablet Take 5 mg by mouth 2 (two) times daily before a meal.   LANTUS SOLOSTAR 100 UNIT/ML Solostar Pen Inject 24 Units into the skin at bedtime.   metFORMIN (GLUCOPHAGE-XR) 500 MG 24 hr tablet Take 500 mg by mouth 2 (two) times daily.   Misc Natural Products (AIRBORNE ELDERBERRY) CHEW Chew 1 tablet by mouth at bedtime.   Multiple Vitamins-Minerals (MULTIVITAMIN ADULT) CHEW Chew 1 tablet by mouth at bedtime.   nitroGLYCERIN (NITROSTAT) 0.4 MG SL tablet Place under the tongue.   Omega-3 Fatty Acids (FISH OIL PO) Take 1 capsule by mouth daily.   sacubitril-valsartan (ENTRESTO) 24-26 MG Take 1 tablet by mouth 2 (two) times daily.    Family History    Family History  Problem Relation Age of Onset   CAD Mother    He indicated that his mother is alive. He indicated that his father is deceased.   Social History    Social History    Socioeconomic History   Marital status: Married    Spouse name: Not on file   Number of children: 0   Years of education: Not on file   Highest education level: Not on file  Occupational History   Not on file  Tobacco Use   Smoking status: Former    Packs/day: 1.00    Years: 50.00    Total pack years: 50.00    Types: Cigarettes    Quit date: 2012    Years since quitting: 11.8   Smokeless tobacco: Never   Tobacco comments:    vape  Substance and Sexual Activity   Alcohol use: Yes    Comment: 1 beer per month   Drug use: Not Currently   Sexual activity: Yes  Other Topics Concern   Not on file  Social History Narrative   Not on file   Social Determinants of Health   Financial Resource Strain: Not  on file  Food Insecurity: No Food Insecurity (06/14/2019)   Hunger Vital Sign    Worried About Running Out of Food in the Last Year: Never true    Ran Out of Food in the Last Year: Never true  Transportation Needs: No Transportation Needs (06/14/2019)   PRAPARE - Hydrologist (Medical): No    Lack of Transportation (Non-Medical): No  Physical Activity: Not on file  Stress: Not on file  Social Connections: Not on file  Intimate Partner Violence: Not on file     Review of Systems    General: No chills, fever, night sweats or weight changes.  Cardiovascular:  No chest pain, dyspnea on exertion, edema, orthopnea, palpitations, paroxysmal nocturnal dyspnea. Dermatological: No rash, lesions/masses Respiratory: No cough, dyspnea Urologic: No hematuria, dysuria Abdominal:   No nausea, vomiting, diarrhea, bright red blood per rectum, melena, or hematemesis Neurologic:  No visual changes, weakness, changes in mental status. All other systems reviewed and are otherwise negative except as noted above.  Physical Exam    VS:  BP 100/68 (BP Location: Left Arm, Patient Position: Sitting)   Pulse (!) 58   Wt 173 lb 12.8 oz (78.8 kg)   SpO2 100%   BMI  28.92 kg/m  , BMI Body mass index is 28.92 kg/m. GEN: Well nourished, well developed, in no acute distress. HEENT: Normal. Neck: Supple, no JVD, carotid bruits, or masses. Cardiac: RRR, no murmurs, rubs, or gallops. No clubbing, cyanosis, edema.  Radials/DP/PT 2+ and equal bilaterally.  Respiratory:  Respirations regular and unlabored, clear to auscultation bilaterally. GI: Soft, nontender, nondistended. MS: No deformity or atrophy. Skin: Warm and dry, no rash. Neuro: Strength and sensation are intact. Psych: Normal affect.  Accessory Clinical Findings     Recent Labs: Labs drawn at [facility] 01/19/2022: WBC 8.5, hemoglobin 14.2, hematocrit 42.5, platelet 151.  Sodium 141, potassium 4.2, BUN 20, creatinine 1.43, glucose 186, EGFR 54.  Recent Lipid Panel From [facility] 01/19/2022: LDL 54, HDL 28, triglycerides 206, total cholesterol 160.     ECG personally reviewed by me today sinus bradycardia, rate 58 ST-T wave abnormality.  Unchanged from unchanged from 06/17/2020.     Assessment & Plan   CAD.  Patient denies chest pain or shortness of breath.  Continue medical management with atorvastatin, aspirin, carvedilol. Ischemic cardiomyopathy.  Denies shortness of breath, DOE or syncope.  Last remote device check 11/13/2021 showed normal function with battery and lead parameters stable. PAD.  Bilateral iliac artery occlusion.  Patient reports continued claudication that does not impact or limit lifestyle and seems improved from he was last seen in April 2022. Chronic combined systolic and diastolic heart failure.  Patient is euvolemic today.  He denies lower extremity edema, shortness of breath, or DOE last ICD remote check showed normal device function.  Continue Entresto 24-26 mg twice a day Jardiance 10 mg daily, and Lasix 20 mg daily. Hypertension.  Patient's blood pressure is a little soft today at 100/68.  He reports this is lower than he normally sees it.  He will continue to  monitor it at home and call the office if he sees any trending down.  Continue carvedilol 12.5 mg twice a day. Hyperlipidemia.  Last LDL 01/19/2022 was 54.  He is at goal.  Continue atorvastatin 80 mg at bedtime.  Disposition: Patient getting labs drawn through [facility] this month.  He will request them to forward results to our office.  Return to Dr.  Crenshaw in 1 year or sooner as needed.   Justice Britain. Sherron Mummert, NP-C     04/30/2022, 1:23 PM [place] [address] Office [phone] Fax [phone]   I spent 12 minutes examining this patient, reviewing medications, and using patient centered shared decision making involving her cardiac care.  Prior to her visit I spent greater than 20 minutes reviewing her past medical history,  medications, and prior cardiac tests.

## 2022-04-30 ENCOUNTER — Encounter: Payer: Self-pay | Admitting: General Practice

## 2022-04-30 ENCOUNTER — Ambulatory Visit: Payer: HMO | Attending: Cardiology | Admitting: Student

## 2022-04-30 VITALS — BP 100/68 | HR 58 | Wt 173.8 lb

## 2022-04-30 DIAGNOSIS — I2510 Atherosclerotic heart disease of native coronary artery without angina pectoris: Secondary | ICD-10-CM | POA: Diagnosis not present

## 2022-04-30 DIAGNOSIS — I10 Essential (primary) hypertension: Secondary | ICD-10-CM

## 2022-04-30 DIAGNOSIS — I739 Peripheral vascular disease, unspecified: Secondary | ICD-10-CM

## 2022-04-30 DIAGNOSIS — I5042 Chronic combined systolic (congestive) and diastolic (congestive) heart failure: Secondary | ICD-10-CM

## 2022-04-30 DIAGNOSIS — E7800 Pure hypercholesterolemia, unspecified: Secondary | ICD-10-CM

## 2022-04-30 DIAGNOSIS — I255 Ischemic cardiomyopathy: Secondary | ICD-10-CM | POA: Diagnosis not present

## 2022-04-30 NOTE — Patient Instructions (Signed)
Medication Instructions:  The current medical regimen is effective;  continue present plan and medications as directed. Please refer to the Current Medication list given to you today.  *If you need a refill on your cardiac medications before your next appointment, please call your pharmacy*  Lab Work: NONE  Other Instructions MAKE SURE TO TELL YOUR PRIMARY MD TO FORWARD LAB RESULTS   Follow-Up: At [facility], you and your health needs are our priority.  As part of our continuing mission to provide you with exceptional heart care, we have created designated Provider Care Teams.  These Care Teams include your primary Cardiologist (physician) and Advanced Practice Providers (APPs -  Physician Assistants and Nurse Practitioners) who all work together to provide you with the care you need, when you need it.  Your next appointment:   12 month(s)  The format for your next appointment:   In Person  Provider:   Kirk Ruths, MD     Important Information About Sugar

## 2022-05-14 ENCOUNTER — Ambulatory Visit (INDEPENDENT_AMBULATORY_CARE_PROVIDER_SITE_OTHER): Payer: HMO

## 2022-05-14 DIAGNOSIS — I255 Ischemic cardiomyopathy: Secondary | ICD-10-CM

## 2022-05-14 LAB — CUP PACEART REMOTE DEVICE CHECK
Battery Remaining Longevity: 60 mo
Battery Remaining Percentage: 62 %
Brady Statistic RV Percent Paced: 0 %
Date Time Interrogation Session: 20231116030100
HighPow Impedance: 66 Ohm
Implantable Lead Connection Status: 753985
Implantable Lead Implant Date: 20140523
Implantable Lead Location: 753860
Implantable Lead Model: 296
Implantable Lead Serial Number: 305696
Implantable Pulse Generator Implant Date: 20140523
Lead Channel Impedance Value: 534 Ohm
Lead Channel Pacing Threshold Amplitude: 0.5 V
Lead Channel Pacing Threshold Pulse Width: 0.4 ms
Lead Channel Setting Pacing Amplitude: 2 V
Lead Channel Setting Pacing Pulse Width: 0.4 ms
Lead Channel Setting Sensing Sensitivity: 0.6 mV
Pulse Gen Serial Number: 100436

## 2022-06-04 NOTE — Progress Notes (Signed)
Remote ICD transmission.   

## 2022-06-20 ENCOUNTER — Emergency Department (HOSPITAL_COMMUNITY): Payer: HMO

## 2022-06-20 ENCOUNTER — Emergency Department (HOSPITAL_COMMUNITY): Payer: HMO | Admitting: Anesthesiology

## 2022-06-20 ENCOUNTER — Inpatient Hospital Stay (HOSPITAL_COMMUNITY)
Admission: EM | Admit: 2022-06-20 | Discharge: 2022-06-25 | DRG: 023 | Disposition: A | Discharge status: Rehabilitation Facility | Payer: HMO | Attending: Neurology | Admitting: Neurology

## 2022-06-20 ENCOUNTER — Encounter (HOSPITAL_COMMUNITY): Payer: Self-pay | Admitting: Anesthesiology

## 2022-06-20 ENCOUNTER — Inpatient Hospital Stay (HOSPITAL_COMMUNITY): Payer: HMO

## 2022-06-20 ENCOUNTER — Other Ambulatory Visit: Payer: Self-pay

## 2022-06-20 ENCOUNTER — Encounter (HOSPITAL_COMMUNITY)
Admission: EM | Disposition: A | Discharge status: Rehabilitation Facility | Payer: Self-pay | Source: Home / Self Care | Attending: Neurology

## 2022-06-20 DIAGNOSIS — Z950 Presence of cardiac pacemaker: Secondary | ICD-10-CM | POA: Diagnosis not present

## 2022-06-20 DIAGNOSIS — G8191 Hemiplegia, unspecified affecting right dominant side: Secondary | ICD-10-CM | POA: Diagnosis present

## 2022-06-20 DIAGNOSIS — I745 Embolism and thrombosis of iliac artery: Secondary | ICD-10-CM | POA: Diagnosis present

## 2022-06-20 DIAGNOSIS — E11649 Type 2 diabetes mellitus with hypoglycemia without coma: Secondary | ICD-10-CM | POA: Diagnosis present

## 2022-06-20 DIAGNOSIS — E876 Hypokalemia: Secondary | ICD-10-CM | POA: Diagnosis not present

## 2022-06-20 DIAGNOSIS — Z95810 Presence of automatic (implantable) cardiac defibrillator: Secondary | ICD-10-CM

## 2022-06-20 DIAGNOSIS — I69351 Hemiplegia and hemiparesis following cerebral infarction affecting right dominant side: Secondary | ICD-10-CM | POA: Diagnosis not present

## 2022-06-20 DIAGNOSIS — Z7984 Long term (current) use of oral hypoglycemic drugs: Secondary | ICD-10-CM

## 2022-06-20 DIAGNOSIS — E785 Hyperlipidemia, unspecified: Secondary | ICD-10-CM | POA: Insufficient documentation

## 2022-06-20 DIAGNOSIS — I130 Hypertensive heart and chronic kidney disease with heart failure and stage 1 through stage 4 chronic kidney disease, or unspecified chronic kidney disease: Secondary | ICD-10-CM | POA: Diagnosis present

## 2022-06-20 DIAGNOSIS — D631 Anemia in chronic kidney disease: Secondary | ICD-10-CM | POA: Diagnosis present

## 2022-06-20 DIAGNOSIS — I6602 Occlusion and stenosis of left middle cerebral artery: Secondary | ICD-10-CM | POA: Diagnosis present

## 2022-06-20 DIAGNOSIS — I5022 Chronic systolic (congestive) heart failure: Secondary | ICD-10-CM | POA: Diagnosis present

## 2022-06-20 DIAGNOSIS — R4701 Aphasia: Secondary | ICD-10-CM | POA: Diagnosis present

## 2022-06-20 DIAGNOSIS — I110 Hypertensive heart disease with heart failure: Secondary | ICD-10-CM | POA: Diagnosis not present

## 2022-06-20 DIAGNOSIS — I63412 Cerebral infarction due to embolism of left middle cerebral artery: Secondary | ICD-10-CM | POA: Diagnosis present

## 2022-06-20 DIAGNOSIS — E119 Type 2 diabetes mellitus without complications: Secondary | ICD-10-CM

## 2022-06-20 DIAGNOSIS — R29810 Facial weakness: Secondary | ICD-10-CM | POA: Diagnosis present

## 2022-06-20 DIAGNOSIS — I2510 Atherosclerotic heart disease of native coronary artery without angina pectoris: Secondary | ICD-10-CM | POA: Diagnosis present

## 2022-06-20 DIAGNOSIS — R471 Dysarthria and anarthria: Secondary | ICD-10-CM | POA: Diagnosis present

## 2022-06-20 DIAGNOSIS — N183 Chronic kidney disease, stage 3 unspecified: Secondary | ICD-10-CM | POA: Insufficient documentation

## 2022-06-20 DIAGNOSIS — H518 Other specified disorders of binocular movement: Secondary | ICD-10-CM | POA: Diagnosis present

## 2022-06-20 DIAGNOSIS — J9601 Acute respiratory failure with hypoxia: Secondary | ICD-10-CM | POA: Diagnosis present

## 2022-06-20 DIAGNOSIS — I6389 Other cerebral infarction: Secondary | ICD-10-CM

## 2022-06-20 DIAGNOSIS — G4700 Insomnia, unspecified: Secondary | ICD-10-CM | POA: Diagnosis present

## 2022-06-20 DIAGNOSIS — I10 Essential (primary) hypertension: Secondary | ICD-10-CM | POA: Insufficient documentation

## 2022-06-20 DIAGNOSIS — I509 Heart failure, unspecified: Secondary | ICD-10-CM

## 2022-06-20 DIAGNOSIS — Z7982 Long term (current) use of aspirin: Secondary | ICD-10-CM

## 2022-06-20 DIAGNOSIS — I255 Ischemic cardiomyopathy: Secondary | ICD-10-CM | POA: Insufficient documentation

## 2022-06-20 DIAGNOSIS — I639 Cerebral infarction, unspecified: Secondary | ICD-10-CM | POA: Diagnosis not present

## 2022-06-20 DIAGNOSIS — E1122 Type 2 diabetes mellitus with diabetic chronic kidney disease: Secondary | ICD-10-CM | POA: Diagnosis present

## 2022-06-20 DIAGNOSIS — E1151 Type 2 diabetes mellitus with diabetic peripheral angiopathy without gangrene: Secondary | ICD-10-CM

## 2022-06-20 DIAGNOSIS — F17200 Nicotine dependence, unspecified, uncomplicated: Secondary | ICD-10-CM | POA: Insufficient documentation

## 2022-06-20 DIAGNOSIS — Z79899 Other long term (current) drug therapy: Secondary | ICD-10-CM

## 2022-06-20 DIAGNOSIS — D72829 Elevated white blood cell count, unspecified: Secondary | ICD-10-CM | POA: Diagnosis not present

## 2022-06-20 DIAGNOSIS — Z87891 Personal history of nicotine dependence: Secondary | ICD-10-CM | POA: Diagnosis not present

## 2022-06-20 DIAGNOSIS — F17290 Nicotine dependence, other tobacco product, uncomplicated: Secondary | ICD-10-CM | POA: Diagnosis present

## 2022-06-20 DIAGNOSIS — Z23 Encounter for immunization: Secondary | ICD-10-CM

## 2022-06-20 DIAGNOSIS — Z8249 Family history of ischemic heart disease and other diseases of the circulatory system: Secondary | ICD-10-CM

## 2022-06-20 DIAGNOSIS — I700 Atherosclerosis of aorta: Secondary | ICD-10-CM | POA: Diagnosis present

## 2022-06-20 DIAGNOSIS — R1310 Dysphagia, unspecified: Secondary | ICD-10-CM | POA: Diagnosis present

## 2022-06-20 DIAGNOSIS — N1831 Chronic kidney disease, stage 3a: Secondary | ICD-10-CM | POA: Diagnosis present

## 2022-06-20 DIAGNOSIS — I63512 Cerebral infarction due to unspecified occlusion or stenosis of left middle cerebral artery: Secondary | ICD-10-CM | POA: Diagnosis present

## 2022-06-20 DIAGNOSIS — R29719 NIHSS score 19: Secondary | ICD-10-CM | POA: Diagnosis present

## 2022-06-20 DIAGNOSIS — Z9049 Acquired absence of other specified parts of digestive tract: Secondary | ICD-10-CM

## 2022-06-20 DIAGNOSIS — I739 Peripheral vascular disease, unspecified: Secondary | ICD-10-CM | POA: Insufficient documentation

## 2022-06-20 HISTORY — PX: IR PERCUTANEOUS ART THROMBECTOMY/INFUSION INTRACRANIAL INC DIAG ANGIO: IMG6087

## 2022-06-20 HISTORY — PX: IR CT HEAD LTD: IMG2386

## 2022-06-20 HISTORY — PX: IR US GUIDE VASC ACCESS RIGHT: IMG2390

## 2022-06-20 HISTORY — PX: IR ANGIO INTRA EXTRACRAN SEL COM CAROTID INNOMINATE UNI R MOD SED: IMG5359

## 2022-06-20 HISTORY — PX: RADIOLOGY WITH ANESTHESIA: SHX6223

## 2022-06-20 LAB — COMPREHENSIVE METABOLIC PANEL
ALT: 15 U/L (ref 0–44)
AST: 27 U/L (ref 15–41)
Albumin: 3.3 g/dL — ABNORMAL LOW (ref 3.5–5.0)
Alkaline Phosphatase: 77 U/L (ref 38–126)
Anion gap: 11 (ref 5–15)
BUN: 21 mg/dL (ref 8–23)
CO2: 19 mmol/L — ABNORMAL LOW (ref 22–32)
Calcium: 8.1 mg/dL — ABNORMAL LOW (ref 8.9–10.3)
Chloride: 107 mmol/L (ref 98–111)
Creatinine, Ser: 1.31 mg/dL — ABNORMAL HIGH (ref 0.61–1.24)
GFR, Estimated: 59 mL/min — ABNORMAL LOW (ref >60)
Glucose, Bld: 140 mg/dL — ABNORMAL HIGH (ref 70–99)
Potassium: 4.9 mmol/L (ref 3.5–5.1)
Sodium: 137 mmol/L (ref 135–145)
Total Bilirubin: 1.1 mg/dL (ref 0.3–1.2)
Total Protein: 6.4 g/dL — ABNORMAL LOW (ref 6.5–8.1)

## 2022-06-20 LAB — HIV ANTIBODY (ROUTINE TESTING W REFLEX): HIV Screen 4th Generation wRfx: NONREACTIVE

## 2022-06-20 LAB — ECHOCARDIOGRAM COMPLETE
AR max vel: 3.51 cm2
AV Area VTI: 3.35 cm2
AV Area mean vel: 3.42 cm2
AV Mean grad: 3 mmHg
AV Peak grad: 5.5 mmHg
Ao pk vel: 1.17 m/s
Area-P 1/2: 2.95 cm2
S' Lateral: 5.7 cm
Single Plane A4C EF: 39.3 %
Weight: 2814.83 oz

## 2022-06-20 LAB — DIFFERENTIAL
Abs Immature Granulocytes: 0.05 10*3/uL (ref 0.00–0.07)
Basophils Absolute: 0.1 10*3/uL (ref 0.0–0.1)
Basophils Relative: 1 %
Eosinophils Absolute: 0.1 10*3/uL (ref 0.0–0.5)
Eosinophils Relative: 1 %
Immature Granulocytes: 1 %
Lymphocytes Relative: 13 %
Lymphs Abs: 1.3 10*3/uL (ref 0.7–4.0)
Monocytes Absolute: 0.9 10*3/uL (ref 0.1–1.0)
Monocytes Relative: 9 %
Neutro Abs: 7.8 10*3/uL — ABNORMAL HIGH (ref 1.7–7.7)
Neutrophils Relative %: 75 %

## 2022-06-20 LAB — CBC
HCT: 41.4 % (ref 39.0–52.0)
Hemoglobin: 14.1 g/dL (ref 13.0–17.0)
MCH: 31.1 pg (ref 26.0–34.0)
MCHC: 34.1 g/dL (ref 30.0–36.0)
MCV: 91.4 fL (ref 80.0–100.0)
Platelets: 162 10*3/uL (ref 150–400)
RBC: 4.53 MIL/uL (ref 4.22–5.81)
RDW: 14.9 % (ref 11.5–15.5)
WBC: 10.2 10*3/uL (ref 4.0–10.5)
nRBC: 0 % (ref 0.0–0.2)

## 2022-06-20 LAB — PROTIME-INR
INR: 1.1 (ref 0.8–1.2)
Prothrombin Time: 13.6 seconds (ref 11.4–15.2)

## 2022-06-20 LAB — GLUCOSE, CAPILLARY
Glucose-Capillary: 172 mg/dL — ABNORMAL HIGH (ref 70–99)
Glucose-Capillary: 51 mg/dL — ABNORMAL LOW (ref 70–99)
Glucose-Capillary: 77 mg/dL (ref 70–99)
Glucose-Capillary: 91 mg/dL (ref 70–99)

## 2022-06-20 LAB — APTT: aPTT: 30 seconds (ref 24–36)

## 2022-06-20 LAB — CBG MONITORING, ED: Glucose-Capillary: 154 mg/dL — ABNORMAL HIGH (ref 70–99)

## 2022-06-20 LAB — ETHANOL: Alcohol, Ethyl (B): <10 mg/dL (ref <10)

## 2022-06-20 SURGERY — IR WITH ANESTHESIA
Anesthesia: General

## 2022-06-20 MED ORDER — GLYCOPYRROLATE PF 0.2 MG/ML IJ SOSY
PREFILLED_SYRINGE | INTRAMUSCULAR | Status: DC | PRN
  Administered 2022-06-20: .1 mg via INTRAVENOUS

## 2022-06-20 MED ORDER — SUGAMMADEX SODIUM 200 MG/2ML IV SOLN
INTRAVENOUS | Status: DC | PRN
  Administered 2022-06-20: 200 mg via INTRAVENOUS

## 2022-06-20 MED ORDER — IOHEXOL 350 MG/ML SOLN
75.0000 mL | Freq: Once | INTRAVENOUS | Status: AC | PRN
  Administered 2022-06-20: 75 mL via INTRAVENOUS

## 2022-06-20 MED ORDER — PHENYLEPHRINE HCL-NACL 20-0.9 MG/250ML-% IV SOLN
INTRAVENOUS | Status: DC | PRN
  Administered 2022-06-20: 40 ug/min via INTRAVENOUS

## 2022-06-20 MED ORDER — HEPARIN SODIUM (PORCINE) 1000 UNIT/ML IJ SOLN
INTRAMUSCULAR | Status: AC
  Filled 2022-06-20: qty 10

## 2022-06-20 MED ORDER — NICOTINE 21 MG/24HR TD PT24
21.0000 mg | MEDICATED_PATCH | Freq: Every day | TRANSDERMAL | Status: DC
  Administered 2022-06-20 – 2022-06-25 (×6): 21 mg via TRANSDERMAL
  Filled 2022-06-20 (×6): qty 1

## 2022-06-20 MED ORDER — STROKE: EARLY STAGES OF RECOVERY BOOK
Freq: Once | Status: AC
  Filled 2022-06-20: qty 1

## 2022-06-20 MED ORDER — PERFLUTREN LIPID MICROSPHERE
1.0000 mL | INTRAVENOUS | Status: AC | PRN
  Administered 2022-06-20: 4 mL via INTRAVENOUS

## 2022-06-20 MED ORDER — IOHEXOL 300 MG/ML  SOLN
100.0000 mL | Freq: Once | INTRAMUSCULAR | Status: AC | PRN
  Administered 2022-06-20: 50 mL via INTRA_ARTERIAL

## 2022-06-20 MED ORDER — SODIUM CHLORIDE (PF) 0.9 % IJ SOLN
INTRAVENOUS | Status: AC | PRN
  Administered 2022-06-20: 25 ug via INTRA_ARTERIAL
  Administered 2022-06-20: 100 ug via INTRA_ARTERIAL

## 2022-06-20 MED ORDER — FENTANYL CITRATE (PF) 100 MCG/2ML IJ SOLN
INTRAMUSCULAR | Status: AC
  Filled 2022-06-20: qty 2

## 2022-06-20 MED ORDER — SENNOSIDES-DOCUSATE SODIUM 8.6-50 MG PO TABS: 1.0000 | ORAL_TABLET | Freq: Every evening | ORAL | Status: DC | PRN

## 2022-06-20 MED ORDER — LIDOCAINE 2% (20 MG/ML) 5 ML SYRINGE
INTRAMUSCULAR | Status: DC | PRN
  Administered 2022-06-20: 40 mg via INTRAVENOUS

## 2022-06-20 MED ORDER — VERAPAMIL HCL 2.5 MG/ML IV SOLN
INTRAVENOUS | Status: AC
  Filled 2022-06-20: qty 2

## 2022-06-20 MED ORDER — ETOMIDATE 2 MG/ML IV SOLN
INTRAVENOUS | Status: DC | PRN
  Administered 2022-06-20: 12 mg via INTRAVENOUS

## 2022-06-20 MED ORDER — DEXTROSE 50 % IV SOLN
25.0000 g | INTRAVENOUS | Status: AC
  Administered 2022-06-20: 25 g via INTRAVENOUS
  Filled 2022-06-20: qty 50

## 2022-06-20 MED ORDER — LIDOCAINE HCL 1 % IJ SOLN
INTRAMUSCULAR | Status: AC
  Filled 2022-06-20: qty 20

## 2022-06-20 MED ORDER — ACETAMINOPHEN 325 MG PO TABS: 650.0000 mg | ORAL_TABLET | ORAL | Status: DC | PRN

## 2022-06-20 MED ORDER — ACETAMINOPHEN 650 MG RE SUPP: 650.0000 mg | RECTAL | Status: DC | PRN

## 2022-06-20 MED ORDER — VERAPAMIL HCL 2.5 MG/ML IV SOLN
INTRAVENOUS | Status: AC | PRN
  Administered 2022-06-20: 9 mL via INTRA_ARTERIAL

## 2022-06-20 MED ORDER — ACETAMINOPHEN 325 MG PO TABS
650.0000 mg | ORAL_TABLET | ORAL | Status: DC | PRN
  Filled 2022-06-20: qty 2

## 2022-06-20 MED ORDER — SODIUM CHLORIDE 0.9% FLUSH: 3.0000 mL | Freq: Once | INTRAVENOUS | Status: DC

## 2022-06-20 MED ORDER — PANTOPRAZOLE SODIUM 40 MG IV SOLR
40.0000 mg | Freq: Every day | INTRAVENOUS | Status: DC
  Administered 2022-06-20 – 2022-06-21 (×2): 40 mg via INTRAVENOUS
  Filled 2022-06-20 (×2): qty 10

## 2022-06-20 MED ORDER — ACETAMINOPHEN 160 MG/5ML PO SOLN: 650.0000 mg | ORAL | Status: DC | PRN

## 2022-06-20 MED ORDER — IOHEXOL 300 MG/ML  SOLN
50.0000 mL | Freq: Once | INTRAMUSCULAR | Status: AC | PRN
  Administered 2022-06-20: 25 mL via INTRA_ARTERIAL

## 2022-06-20 MED ORDER — SODIUM CHLORIDE 0.9 % IV SOLN: INTRAVENOUS | Status: DC

## 2022-06-20 MED ORDER — NITROGLYCERIN 1 MG/10 ML FOR IR/CATH LAB
INTRA_ARTERIAL | Status: AC
  Filled 2022-06-20: qty 10

## 2022-06-20 MED ORDER — SUCCINYLCHOLINE CHLORIDE 200 MG/10ML IV SOSY
PREFILLED_SYRINGE | INTRAVENOUS | Status: DC | PRN
  Administered 2022-06-20: 60 mg via INTRAVENOUS

## 2022-06-20 MED ORDER — INSULIN ASPART 100 UNIT/ML IJ SOLN
0.0000 [IU] | INTRAMUSCULAR | Status: DC
  Administered 2022-06-21: 2 [IU] via SUBCUTANEOUS
  Administered 2022-06-21: 3 [IU] via SUBCUTANEOUS
  Administered 2022-06-22 (×2): 2 [IU] via SUBCUTANEOUS
  Administered 2022-06-22: 3 [IU] via SUBCUTANEOUS
  Administered 2022-06-22 – 2022-06-23 (×2): 2 [IU] via SUBCUTANEOUS

## 2022-06-20 MED ORDER — CEFAZOLIN SODIUM-DEXTROSE 2-3 GM-%(50ML) IV SOLR
INTRAVENOUS | Status: DC | PRN
  Administered 2022-06-20: 2 g via INTRAVENOUS

## 2022-06-20 MED ORDER — IOHEXOL 300 MG/ML  SOLN
150.0000 mL | Freq: Once | INTRAMUSCULAR | Status: AC | PRN
  Administered 2022-06-20: 75 mL via INTRA_ARTERIAL

## 2022-06-20 MED ORDER — FENTANYL CITRATE (PF) 100 MCG/2ML IJ SOLN
INTRAMUSCULAR | Status: DC | PRN
  Administered 2022-06-20: 100 ug via INTRAVENOUS

## 2022-06-20 MED ORDER — PHENYLEPHRINE 80 MCG/ML (10ML) SYRINGE FOR IV PUSH (FOR BLOOD PRESSURE SUPPORT)
PREFILLED_SYRINGE | INTRAVENOUS | Status: DC | PRN
  Administered 2022-06-20 (×6): 80 ug via INTRAVENOUS

## 2022-06-20 MED ORDER — IPRATROPIUM-ALBUTEROL 0.5-2.5 (3) MG/3ML IN SOLN
3.0000 mL | Freq: Four times a day (QID) | RESPIRATORY_TRACT | Status: DC | PRN
  Administered 2022-06-20 – 2022-06-23 (×2): 3 mL via RESPIRATORY_TRACT
  Filled 2022-06-20 (×2): qty 3

## 2022-06-20 MED ORDER — LACTATED RINGERS IV SOLN: INTRAVENOUS | Status: DC | PRN

## 2022-06-20 MED ORDER — TENECTEPLASE FOR STROKE
0.2500 mg/kg | PACK | Freq: Once | INTRAVENOUS | Status: AC
  Administered 2022-06-20: 20 mg via INTRAVENOUS
  Filled 2022-06-20: qty 10

## 2022-06-20 MED ORDER — CHLORHEXIDINE GLUCONATE CLOTH 2 % EX PADS
6.0000 | MEDICATED_PAD | Freq: Every day | CUTANEOUS | Status: DC
  Administered 2022-06-21 – 2022-06-22 (×3): 6 via TOPICAL

## 2022-06-20 MED ORDER — ONDANSETRON HCL 4 MG/2ML IJ SOLN
INTRAMUSCULAR | Status: DC | PRN
  Administered 2022-06-20: 4 mg via INTRAVENOUS

## 2022-06-20 MED ORDER — CLEVIDIPINE BUTYRATE 0.5 MG/ML IV EMUL
0.0000 mg/h | INTRAVENOUS | Status: AC
  Administered 2022-06-20: 10 mg/h via INTRAVENOUS
  Administered 2022-06-20: 18 mg/h via INTRAVENOUS
  Administered 2022-06-20: 2 mg/h via INTRAVENOUS
  Administered 2022-06-21: 18 mg/h via INTRAVENOUS
  Filled 2022-06-20 (×5): qty 100

## 2022-06-20 MED ORDER — ROCURONIUM BROMIDE 10 MG/ML (PF) SYRINGE
PREFILLED_SYRINGE | INTRAVENOUS | Status: DC | PRN
  Administered 2022-06-20: 50 mg via INTRAVENOUS
  Administered 2022-06-20: 20 mg via INTRAVENOUS
  Administered 2022-06-20: 10 mg via INTRAVENOUS
  Administered 2022-06-20: 20 mg via INTRAVENOUS

## 2022-06-20 NOTE — H&P (Signed)
Neurology H&P  Reason for Consult: Code stroke Referring Physician: Dr. Doren Custard  CC: Patient is unable to provide chief complaint 2/2 complete aphasia  History is obtained from: EMS, chart review, wife via telephone.  Unable to obtain from patient due to aphasia.  HPI: Richard Flynn is a 66 y.o. male with a medical history significant for coronary artery disease, chronic systolic CHF, cardiomyopathy, CKD stage III, type 2 diabetes mellitus, hyperlipidemia, hypertension, and PAD presented to the ED on 06/20/2022 via EMS for evaluation of acute onset of right-sided weakness.  Patient woke up in his usual state of health this morning and was making coffee with his wife.  Wife watched the patient walking into the living room and when he suddenly collapsed and rolled onto the couch and was unable to speak with a right facial droop.  On EMS arrival, they witnessed a period of unresponsiveness requiring bag ventilation.  EMS also noted a blood glucose of 62 initially and administered D10 with improvement of CBG to 154 on patient arrival to the ED.  LKW: 08:30 with symptom onset witnessed by patient's wife TNK given?: yes, CT head was obtained and reviewed by attending neurologist and neurointerventional radiologist without evidence of ICH.  Patient's wife was contacted via telephone and consent was obtained after discussing risks, benefits, and alternatives of TNK.  TNK administered at 10:10 AM.  IR Thrombectomy?  Yes, vessel imaging revealed emergent LVO with left MCA M1 occlusion and IR was activated.  Again, patient's wife was contacted via telephone and consent was obtained after discussing risks, benefits, and alternatives of IR.  Modified Rankin Scale: 0-Completely asymptomatic and back to baseline post- stroke  ROS: Unable to obtain due to patient aphasia.   Past Medical History:  Diagnosis Date   CAD (coronary artery disease)    a. [facility] notes suggest cardiac cath 2013 showed occluded proximal  RCA with left to right collaterals, 20-40% proximal LAD, 70% distal circumflex artery.   Cardiomyopathy ([place])    Chronic systolic CHF (congestive heart failure) (HCC)    CKD (chronic kidney disease), stage III (HCC)    Diabetes mellitus without complication (HCC)    Hyperlipidemia    Hypertension    NSVT (nonsustained ventricular tachycardia) ([place])    mentioned in device interrogation   PAD (peripheral artery disease) ([place])     with aortoiliac occlusion (med rx per [facility])   Pancreatitis    Past Surgical History:  Procedure Laterality Date   CHOLECYSTECTOMY     ICD IMPLANT     LEFT HEART CATH AND CORONARY ANGIOGRAPHY N/A 06/17/2020   Procedure: LEFT HEART CATH AND CORONARY ANGIOGRAPHY;  Surgeon: Jettie Booze, MD;  Location: [place] [facility];  Service: Cardiovascular;  Laterality: N/A;   RIGHT HEART CATH N/A 06/17/2020   Procedure: RIGHT HEART CATH;  Surgeon: Jettie Booze, MD;  Location: [place] [facility];  Service: Cardiovascular;  Laterality: N/A;   Family History  Problem Relation Age of Onset   CAD Mother    Social History:   reports that he quit smoking about 11 years ago. His smoking use included cigarettes. He has a 50.00 pack-year smoking history. He has never used smokeless tobacco. He reports current alcohol use. He reports that he does not currently use drugs.  Medications  Current Facility-Administered Medications:    fentaNYL (SUBLIMAZE) 100 MCG/2ML injection, , , ,    sodium chloride flush (NS) 0.9 % injection 3 mL, 3 mL, Intravenous, Once, [employer],  MD   tenecteplase (TNKASE) injection for Stroke 20 mg, 0.25 mg/kg, Intravenous, Once, Godfrey Pick, MD  Current Outpatient Medications:    allopurinol (ZYLOPRIM) 100 MG tablet, Take 100 mg by mouth daily., Disp: , Rfl:    aspirin EC 81 MG tablet, Take 81 mg by mouth daily. Swallow whole., Disp: , Rfl:    atorvastatin (LIPITOR) 80 MG tablet, TAKE ONE TABLET BY MOUTH DAILY, Disp: 90 tablet,  Rfl: 3   carvedilol (COREG) 12.5 MG tablet, TAKE ONE TABLET BY MOUTH TWICE A DAY WITH A MEAL, Disp: 180 tablet, Rfl: 3   Cholecalciferol (VITAMIN D3) 125 MCG (5000 UT) CAPS, Take 1 capsule by mouth daily., Disp: , Rfl:    Coenzyme Q10 (COQ10 GUMMIES ADULT) 50 MG CHEW, Chew 1 tablet by mouth at bedtime., Disp: , Rfl:    empagliflozin (JARDIANCE) 10 MG TABS tablet, Take 1 tablet (10 mg total) by mouth daily., Disp: 90 tablet, Rfl: 0   furosemide (LASIX) 20 MG tablet, TAKE ONE TABLET BY MOUTH DAILY, Disp: 90 tablet, Rfl: 3   glipiZIDE (GLUCOTROL) 5 MG tablet, Take 5 mg by mouth 2 (two) times daily before a meal., Disp: , Rfl:    LANTUS SOLOSTAR 100 UNIT/ML Solostar Pen, Inject 24 Units into the skin at bedtime., Disp: , Rfl:    metFORMIN (GLUCOPHAGE-XR) 500 MG 24 hr tablet, Take 500 mg by mouth 2 (two) times daily., Disp: , Rfl:    Misc Natural Products (AIRBORNE ELDERBERRY) CHEW, Chew 1 tablet by mouth at bedtime., Disp: , Rfl:    Multiple Vitamins-Minerals (MULTIVITAMIN ADULT) CHEW, Chew 1 tablet by mouth at bedtime., Disp: , Rfl:    nitroGLYCERIN (NITROSTAT) 0.4 MG SL tablet, Place under the tongue., Disp: , Rfl:    Omega-3 Fatty Acids (FISH OIL PO), Take 1 capsule by mouth daily., Disp: , Rfl:    sacubitril-valsartan (ENTRESTO) 24-26 MG, Take 1 tablet by mouth 2 (two) times daily., Disp: 180 tablet, Rfl: 3  Exam: Current vital signs: Wt 79.8 kg   BMI 29.28 kg/m  Vital signs in last 24 hours: Weight:  [79.8 kg] 79.8 kg (12/23 0900)  GENERAL: Awake, alert, appears critically ill  Psych: Unable to assess due to patient's condition Head: Normocephalic and atraumatic, without obvious abnormality EENT: Normal conjunctivae, foaming at the the right side of mouth LUNGS: Slightly labored breathing, no audible wheezing. CV: Regular rate and rhythm on telemetry ABDOMEN: Soft, non-distended Extremities: Warm, well perfused, without obvious deformity  NEURO:  Mental Status: Awake, alert.   Nonverbal. Intermittently mimics examiner but does not follow verbal commands. Patient is densely aphasic. Cranial Nerves:  II: PERRL  III, IV, VI: Leftward gaze preference, will cross midline towards the right. V: Does not blink to threat on the left. VII: Right facial droop VIII: Hearing is intact to voice IX, X: Does not allow for visualization of soft palate XI: Does not shrug shoulders, head is turned leftward but will occasionally look towards the right XII: Does not protrude tongue to command Motor: Left upper and lower extremity are able to elevate antigravity without vertical drift. Right upper and lower extremity weakness that varies throughout.  At best, patient is able to elevate right lower extremity with minimal vertical drift.  Patient's right upper extremity drifts to bed after passive elevation.  Tone is normal. Bulk is normal.  Sensation: Withdraws to noxious stimuli throughout though more briskly on the left Coordination: Does not perform Gait: Deferred  NIHSS: 1a Level of Conscious.: 0 1b LOC  Questions: 2 1c LOC Commands: 2 2 Best Gaze: 1 3 Visual: 2 4 Facial Palsy: 2 5a Motor Arm - left: 0 5b Motor Arm - Right: 2 6a Motor Leg - Left: 0 6b Motor Leg - Right: 1 7 Limb Ataxia: 0 8 Sensory: 1 9 Best Language: 3 10 Dysarthria: 2 11 Extinct. and Inatten.: 1 TOTAL: 19  Labs I have reviewed labs in epic and the results pertinent to this consultation are: CBC    Component Value Date/Time   WBC 8.9 06/18/2020 0209   RBC 3.86 (L) 06/18/2020 0209   HGB 11.6 (L) 06/18/2020 0209   HCT 35.6 (L) 06/18/2020 0209   PLT 179 06/18/2020 0209   MCV 92.2 06/18/2020 0209   MCH 30.1 06/18/2020 0209   MCHC 32.6 06/18/2020 0209   RDW 14.6 06/18/2020 0209   LYMPHSABS 1.0 06/14/2020 1547   MONOABS 0.7 06/14/2020 1547   EOSABS 0.1 06/14/2020 1547   BASOSABS 0.1 06/14/2020 1547   CMP     Component Value Date/Time   NA 142 07/18/2020 1156   K 4.8 07/18/2020 1156   CL  102 07/18/2020 1156   CO2 23 07/18/2020 1156   GLUCOSE 127 (H) 07/18/2020 1156   GLUCOSE 138 (H) 06/18/2020 0209   BUN 30 (H) 07/18/2020 1156   CREATININE 1.56 (H) 07/18/2020 1156   CALCIUM 9.2 07/18/2020 1156   PROT 6.9 06/14/2020 1547   ALBUMIN 3.7 06/14/2020 1547   AST 32 06/14/2020 1547   ALT 37 06/14/2020 1547   ALKPHOS 73 06/14/2020 1547   BILITOT 1.4 (H) 06/14/2020 1547   GFRNONAA 46 (L) 07/18/2020 1156   GFRNONAA 56 (L) 06/18/2020 0209   GFRAA 53 (L) 07/18/2020 1156   Lipid Panel  No results found for: "CHOL", "TRIG", "HDL", "CHOLHDL", "VLDL", "LDLCALC", "LDLDIRECT"  Lab Results  Component Value Date   HGBA1C 6.7 (H) 06/15/2020   Imaging I have reviewed the images obtained: CT head 12/23: 1. Hyperdense left MCA, CTA is pending.  Aspects is 10. 2. No intracranial hemorrhage  CT angio head and neck w+wo 12/23: 1. Emergent large vessel occlusion at the left M1 segment. There is underfilling of downstream reconstituted vessels. No proximal flow limiting stenosis or ulceration in this distribution. 2. 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  Assessment: 66 year old male with PMHx of CAD, CHF, cardiomyopathy, CKD III, DM2, HLD, HTN, and PAD who presents with acute onset of right-sided weakness, right-sided facial droop, and aphasia with onset at 830 this morning witnessed by his wife.  Patient did sustain a period of unresponsiveness on EMS arrival requiring bagged ventilation and hypoglycemia with initial blood glucose of 62 s/p D10 administration prior to hospital arrival.  Patient CBG was 154 on arrival.  CT imaging revealed hyperdense left MCA sign and vessel imaging revealed an emergent left M1 occlusion.  Patient was given TNK and taken to IR.  Plan: Acute Ischemic Stroke Cerebral infarction due to occlusion of left middle cerebral artery   Acuity: Acute Current Suspected Etiology: LVO work up pending   Continue Evaluation:  -Admit to: ICU -Hold Aspirin until 24 hour post tPA neuroimaging is stable and without evidence of bleeding. Further recommendations per IR.  -Blood pressure control, goal of SYS < 180 / 150 for 24 hours post TNK. Further blood pressure goals per IR following procedure.  -MRI/ECHO/A1C/Lipid panel. -Hyperglycemia management per SSI to maintain glucose 140-'180mg'$ /dL. -PT/OT/ST therapies and recommendations when able  CNS Cerebral edema -Close neuro  monitoring -STAT CTH with any neurologic decline and notify neurology  Dysarthria Dysphagia following cerebral infarction  -NPO until cleared by speech -ST -Advance diet as tolerated  Hemiplegia and hemiparesis following cerebral infarction affecting right dominant side -PT/OT -PM&R consult as needed  RESP Acute Respiratory Failure  -vent management per ICU -wean when able  CV Essential (primary) hypertension -Aggressive BP control, goal SBP < 180 / 105 for 24 hours post TNK -PRN labetalol and cleviprex for blood pressure management   Chronic systolic (congestive) heart failure  -TTE  Hyperlipidemia, unspecified  - Statin for goal LDL < 70  HEME AM CBC -Monitor H&H -Transfuse for hgb < 7  ENDO Type 2 diabetes mellitus with hypoglycemia PTA -SSI as needed -Hypoglycemia protocol for CBG < 70 -goal HgbA1c < 7%  GI/GU CKD Stage 3 (GFR 30-59) -Gentle hydration -avoid nephrotoxic agents  Fluid/Electrolyte Disorders AM CMP -Replete as needed -Repeat labs as needed  ID Possible Aspiration PNA -CXR  Trend fever and WBC curve UA pending   Prophylaxis DVT:  SCDs GI: PPI Bowel: Docusate / Senna  Diet: NPO until cleared by speech  Code Status: Full Code    THE FOLLOWING WERE PRESENT ON ADMISSION: CNS -  Acute Ischemic Stroke, Hemiplegia Cardiovascular - Chronic Systolic CHF,   Stevi Toberman, AGAC-NP [facility] Pager: [phone]   NEUROHOSPITALIST  ADDENDUM Performed a face to face diagnostic evaluation.   I have reviewed the contents of history and physical exam as documented by PA/ARNP/Resident and agree with above documentation.  I have discussed and formulated the above plan as documented. Edits to the note have been made as needed.  Impression/Key exam findings/Plan: 19M with CAD, CHF, CMP, CKD2, DM2, HTN, HLD who presents with sudden onset R sided weakness, L hemianopsia and global aphasia and mute. CTH with ASPECTS of 10, no ICH, has hyperdense L MCA M1. CTA with L MCA M1 occlusion.  I spoke with daughter and wife over phone. Discussed disabling deficits at this time along with risks, benefits, details of administration and alternatives to tnkase. I went through the inclusion and exclusion criteria for tnk. I discussed that a large part of the left brain is actively dying as we are speaking. Wife and daughter consented to tnkase administration. CTA revealed left MCA M1 occlusion. Dr. Estanislado Pandy discussed details or thrombectomy along with risks, benefits and alternatives. Wife and daughter consented to thrombectomy. He is being wheeled to IR.  This patient is critically ill and at significant risk of neurological worsening, death and care requires constant monitoring of vital signs, hemodynamics,respiratory and cardiac monitoring, neurological assessment, discussion with family, other specialists and medical decision making of high complexity. I spent 45 minutes of neurocritical care time  in the care of  this patient. This was time spent independent of any time provided by nurse practitioner or PA.  Donnetta Simpers [facility] Pager Number [phone] 06/20/2022  11:17 AM   Donnetta Simpers, MD [facility] [phone]   If 7pm to 7am, please call on call as listed on AMION.

## 2022-06-20 NOTE — Procedures (Signed)
INR.  Bilateral common carotid arteriograms.  Right radial approach.  Right CFA approach abandoned . Findings. 1.  Occluded left middle cerebral artery mid M1 segment. Status post complete revascularization of the the occluded left middle cerebral artery M1 segment, with four passes with stent retriever, and contact aspiration achieving a TICI 3 revascularization. Right CFA approach abandoned due to occluded distal abdominal aorta and the left common iliac  artery.  Post CT of the brain no evidence of hemorrhagic complications  Right splint applied to the right radial puncture site.  Right radial pulse verified to be present.  5 French Angio-Seal applied for hemostasis at the right common femoral artery puncture site.  Distal pulses all dopplerable in both feet. Patient extubated. Maintaining O2 saturations.5 desaturations.  Pupils 3 mm bilaterally and sluggishly reactive. Not following simple commands.  Moving left side spontaneously.  raised right hand against gravity, with flickering of the right toes. Arlean Hopping MD.

## 2022-06-20 NOTE — Progress Notes (Signed)
  Echocardiogram 2D Echocardiogram has been performed.  [name] 06/20/2022, 3:38 PM

## 2022-06-20 NOTE — Transfer of Care (Signed)
Immediate Anesthesia Transfer of Care Note  Patient: Richard Flynn  Procedure(s) Performed: IR WITH ANESTHESIA  Patient Location: ICU  Anesthesia Type:General  Level of Consciousness: awake and patient cooperative  Airway & Oxygen Therapy: Patient Spontanous Breathing and Patient connected to face mask oxygen  Post-op Assessment: Report given to RN  Post vital signs: Reviewed and stable  Last Vitals:  Vitals Value Taken Time  BP 143/75   Temp    Pulse 66   Resp 18   SpO2 96     Last Pain: There were no vitals filed for this visit.       Complications: No notable events documented.

## 2022-06-20 NOTE — Progress Notes (Signed)
Brief Neuro Update:  Was notified of gurgling respirations. He was extubated after thrombectomy earlier today.  He is also on nonrebreather.  RT at bedside did upper airway suction with resolution of gurgling respirations.  Dr. Erin Fulling from critical care team was also at bedside but given his respirations and his saturations and vitals look fine right now, I do not think we need critical care team's help at this time.  I certainly appreciate them coming by the bedside to help [place] out.  Our plan is to do breathing treatment with DuoNeb every 6 hours and have RT suction his upper airway as needed.  We will monitor for any fever or other signs of aspiration and if he does end up showing signs or symptoms of aspiration pneumonia, will need to start him on antibiotics.  [place] Pager Number [phone]

## 2022-06-20 NOTE — ED Triage Notes (Signed)
Pt BIB [facility] due to Code stroke. LSN 0830. Pts wife called out for right sided weakness, aphasia. EMS arrived, pt was unresponsive and was being  bagged. No known blood thinners.

## 2022-06-20 NOTE — Anesthesia Procedure Notes (Signed)
Procedure Name: Intubation Date/Time: 06/20/2022 10:55 AM  Performed by: Barrington Ellison, CRNAPre-anesthesia Checklist: Patient identified, Emergency Drugs available, Suction available and Patient being monitored Patient Re-evaluated:Patient Re-evaluated prior to induction Oxygen Delivery Method: Circle System Utilized Preoxygenation: Pre-oxygenation with 100% oxygen Induction Type: IV induction, Rapid sequence and Cricoid Pressure applied Laryngoscope Size: Mac and 4 Grade View: Grade I Tube type: Oral Tube size: 7.5 mm Number of attempts: 1 Airway Equipment and Method: Oral airway Placement Confirmation: ETT inserted through vocal cords under direct vision, positive ETCO2 and breath sounds checked- equal and bilateral Secured at: 23 cm Tube secured with: Tape Dental Injury: Teeth and Oropharynx as per pre-operative assessment

## 2022-06-20 NOTE — Progress Notes (Signed)
PT Cancellation Note  Patient Details Name: Richard Flynn MRN: [id number] DOB: 10/05/1955   Cancelled Treatment:    Reason Eval/Treat Not Completed: Active bedrest order. TNK administered at 10:10 AM. Per PT protocol, will plan to hold off on PT Eval until >/= 24 hours post TNK administration unless specified otherwise.     Moishe Spice, PT, DPT Acute Rehabilitation Services  Office: [place] 06/20/2022, 3:09 PM

## 2022-06-20 NOTE — Progress Notes (Signed)
RT asked to NT suction pt by CCM MD. Moderate amount of yellow thick secretions removed from Right Nare, Small amount of Hemoptysis from Left Nare. Pt taken off Simple mask and placed on 4L Nasal Cannula.

## 2022-06-20 NOTE — Progress Notes (Signed)
PHARMACIST CODE STROKE RESPONSE  Notified to mix TNK at 1009 by Dr. Lorrin Goodell TNK preparation completed at 1010  TNK dose = 20 mg IV over 5 seconds.   Issues/delays encountered (if applicable):   Richard Flynn 06/20/22 10:10 AM

## 2022-06-20 NOTE — Progress Notes (Addendum)
Brief Neuro Update:  Notified of change in neuro status, R pupil is dilated and unreactive. Could be from the duoneb he got earlier. Rest of the exam is stable. However, he got tnkase earlier and underwent thrombectomy 2/2 L MCA M1 thrombus.  Recs: - ordered STAT CT Head and met the patient in the scanner. Reviewed CT Head and was negative for any malignant edema or significant midline shift. Does seem to have loss of grey white differentiation in left temporal lobe with effacement of sulci and gyri. - Ordered CT angio head and neck to ensure no basilar thrombosis. Reviewed and negative for any emergent LVO.  [place] Pager Number [phone]

## 2022-06-20 NOTE — ED Provider Notes (Signed)
[facility] EMERGENCY DEPARTMENT Provider Note   CSN: [id number] Arrival date & time: 06/20/22  0350     History  Chief Complaint  Patient presents with   Code Stroke    Richard Flynn is a 66 y.o. male.  HPI Patient presents for strokelike symptoms.  Medical history includes CAD, CHF, CKD, DM, HLD, HTN, PAD.  Per EMS, patient awoke in his normal state of health.  At 8:30 AM, he was at home with his wife when she saw him fall onto the couch.  He was aphasic.  EMS was called to the scene.  EMS noted continued aphasia.  He did have a brief period of unresponsiveness.  Breathing was assisted with BVM.  He regained consciousness but has not been able to verbalize.  CBG prior to arrival was 72.  Patient has been normotensive with heart rate in the range of 60.    Home Medications Prior to Admission medications   Medication Sig Start Date End Date Taking? Authorizing Provider  allopurinol (ZYLOPRIM) 100 MG tablet Take 100 mg by mouth daily.    [provider]  aspirin EC 81 MG tablet Take 81 mg by mouth daily. Swallow whole.    [provider]  atorvastatin (LIPITOR) 80 MG tablet TAKE ONE TABLET BY MOUTH DAILY 06/19/21   Lelon Perla, MD  carvedilol (COREG) 12.5 MG tablet TAKE ONE TABLET BY MOUTH TWICE A DAY WITH A MEAL 11/10/21   Lelon Perla, MD  Cholecalciferol (VITAMIN D3) 125 MCG (5000 UT) CAPS Take 1 capsule by mouth daily.    [provider]  Coenzyme Q10 (COQ10 GUMMIES ADULT) 50 MG CHEW Chew 1 tablet by mouth at bedtime.    [provider]  empagliflozin (JARDIANCE) 10 MG TABS tablet Take 1 tablet (10 mg total) by mouth daily. 12/16/21   Lelon Perla, MD  furosemide (LASIX) 20 MG tablet TAKE ONE TABLET BY MOUTH DAILY 07/10/21   Lelon Perla, MD  glipiZIDE (GLUCOTROL) 5 MG tablet Take 5 mg by mouth 2 (two) times daily before a meal.    [provider]  LANTUS SOLOSTAR 100 UNIT/ML Solostar Pen Inject 24  Units into the skin at bedtime. 06/13/20   [provider]  metFORMIN (GLUCOPHAGE-XR) 500 MG 24 hr tablet Take 500 mg by mouth 2 (two) times daily.    [provider]  Misc Natural Products (AIRBORNE ELDERBERRY) CHEW Chew 1 tablet by mouth at bedtime.    [provider]  Multiple Vitamins-Minerals (MULTIVITAMIN ADULT) CHEW Chew 1 tablet by mouth at bedtime.    [provider]  nitroGLYCERIN (NITROSTAT) 0.4 MG SL tablet Place under the tongue. 06/14/20   [provider]  Omega-3 Fatty Acids (FISH OIL PO) Take 1 capsule by mouth daily.    [provider]  sacubitril-valsartan (ENTRESTO) 24-26 MG Take 1 tablet by mouth 2 (two) times daily. 08/06/21   Lelon Perla, MD      Allergies    Patient has no known allergies.    Review of Systems   Review of Systems  Unable to perform ROS: Mental status change    Physical Exam Updated Vital Signs Wt 79.8 kg   BMI 29.28 kg/m  Physical Exam Vitals and nursing note reviewed.  Constitutional:      General: He is not in acute distress.    Appearance: He is well-developed. He is not toxic-appearing or diaphoretic.  HENT:     Head: Normocephalic and  atraumatic.     Right Ear: External ear normal.     Left Ear: External ear normal.     Nose: Nose normal.     Mouth/Throat:     Mouth: Mucous membranes are moist.     Comments: Foamy saliva pooling at right side of mouth Eyes:     Conjunctiva/sclera: Conjunctivae normal.     Comments: Left gaze preference  Cardiovascular:     Rate and Rhythm: Normal rate and regular rhythm.  Pulmonary:     Effort: Pulmonary effort is normal. No respiratory distress.  Abdominal:     General: There is no distension.  Musculoskeletal:        General: No swelling.     Cervical back: Neck supple. No rigidity.  Skin:    General: Skin is warm and dry.     Capillary Refill: Capillary refill takes less than 2 seconds.     Coloration: Skin is pale. Skin is not  jaundiced.  Neurological:     Mental Status: He is alert.     GCS: GCS eye subscore is 4. GCS verbal subscore is 1. GCS motor subscore is 6.     Motor: Weakness (Right hemibody) present.     ED Results / Procedures / Treatments   Labs (all labs ordered are listed, but only abnormal results are displayed) Labs Reviewed  CBG MONITORING, ED - Abnormal; Notable for the following components:      Result Value   Glucose-Capillary 154 (*)    All other components within normal limits  PROTIME-INR  APTT  CBC  DIFFERENTIAL  COMPREHENSIVE METABOLIC PANEL  ETHANOL  I-STAT CHEM 8, ED  CBG MONITORING, ED    EKG None  Radiology CT ANGIO HEAD NECK W WO CM (CODE STROKE)  Result Date: 06/20/2022 CLINICAL DATA:  Neuro deficit with acute stroke suspected EXAM: CT ANGIOGRAPHY HEAD AND NECK TECHNIQUE: Multidetector CT imaging of the head and neck was performed using the standard protocol during bolus administration of intravenous contrast. Multiplanar CT image reconstructions and MIPs were obtained to evaluate the vascular anatomy. Carotid stenosis measurements (when applicable) are obtained utilizing NASCET criteria, using the distal internal carotid diameter as the denominator. RADIATION DOSE REDUCTION: This exam was performed according to the departmental dose-optimization program which includes automated exposure control, adjustment of the mA and/or kV according to patient size and/or use of iterative reconstruction technique. CONTRAST:  59m OMNIPAQUE IOHEXOL 350 MG/ML SOLN COMPARISON:  Head CT from earlier today FINDINGS: CTA NECK FINDINGS Aortic arch: Atheromatous plaque Right carotid system: Calcified plaque at the bifurcation without flow limiting stenosis or ulceration Left carotid system: Atheromatous plaque at the bifurcation, mainly calcified. No ulceration or flow limiting stenosis. Vertebral arteries: The right vertebral artery is dominant. Bulky calcified plaque at the right V1 segment  with high-grade narrowing, patent lumen is not visible for measurement purposes. Skeleton: Cervical spine degeneration asymmetric to the left. C2-3 central protrusion impinging on the cord. Other neck: No evidence of mass or inflammation. Upper chest: Negative Review of the MIP images confirms the above findings CTA HEAD FINDINGS Anterior circulation: Calcified plaque along the carotid siphons. Abrupt cut off of the left M1 segment consistent with embolus seen on prior CT without contrast. Underfilling of downstream reconstitute branches. Posterior circulation: Atheromatous plaque along the V4 segment on the right. No branch occlusion, beading, or aneurysm. Venous sinuses: Unremarkable Anatomic variants: Hypoplastic left A1 segment. Review of the MIP images confirms the above findings Finding is known to neurology team,  patient is headed to embolectomy. IMPRESSION: 1. Emergent large vessel occlusion at the left M1 segment. There is underfilling of downstream reconstituted vessels. No proximal flow limiting stenosis or ulceration in this distribution. 2. 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 Electronically Signed   By: Jorje Guild M.D.   On: 06/20/2022 10:34   CT HEAD CODE STROKE WO CONTRAST  Result Date: 06/20/2022 CLINICAL DATA:  Code stroke.  Right-sided weakness and aphasia EXAM: CT HEAD WITHOUT CONTRAST TECHNIQUE: Contiguous axial images were obtained from the base of the skull through the vertex without intravenous contrast. RADIATION DOSE REDUCTION: This exam was performed according to the departmental dose-optimization program which includes automated exposure control, adjustment of the mA and/or kV according to patient size and/or use of iterative reconstruction technique. COMPARISON:  None Available. FINDINGS: Brain: No evidence of acute infarction, hemorrhage, hydrocephalus, extra-axial collection or mass lesion/mass effect. Vascular:  Hyperdense left distal M1 segment. Skull: Normal. Negative for fracture or focal lesion. Sinuses/Orbits: Partial bilateral mastoid opacification. A page has been placed to attending neurologist. ASPECTS [facility] Stroke Program Early CT Score) - Ganglionic level infarction (caudate, lentiform nuclei, internal capsule, insula, M1-M3 cortex): 7 - Supraganglionic infarction (M4-M6 cortex): 3 Total score (0-10 with 10 being normal): 10 IMPRESSION: 1. Hyperdense left MCA, CTA is pending.  Aspects is 10. 2. No intracranial hemorrhage. Electronically Signed   By: Jorje Guild M.D.   On: 06/20/2022 10:08    Procedures Procedures    Medications Ordered in ED Medications  sodium chloride flush (NS) 0.9 % injection 3 mL (has no administration in time range)  tenecteplase (TNKASE) injection for Stroke 20 mg (has no administration in time range)  fentaNYL (SUBLIMAZE) 100 MCG/2ML injection (has no administration in time range)  iohexol (OMNIPAQUE) 350 MG/ML injection 75 mL (75 mLs Intravenous Contrast Given 06/20/22 1012)    ED Course/ Medical Decision Making/ A&P                           Medical Decision Making Amount and/or Complexity of Data Reviewed Labs: ordered. Radiology: ordered.   This patient presents to the ED for concern of altered mental status, this involves an extensive number of treatment options, and is a complaint that carries with it a high risk of complications and morbidity.  The differential diagnosis includes CVA, seizure, polypharmacy, trauma, ICH   Co morbidities that complicate the patient evaluation  CAD, CHF, CKD, DM, HLD, HTN, PAD   Additional history obtained:  Additional history obtained from EMS External records from outside source obtained and reviewed including EMR   Lab Tests:  I Ordered, and personally interpreted labs.  The pertinent results include: Normal blood glucose   Imaging Studies ordered:  I ordered imaging studies including CT head, CTA  head and neck I independently visualized and interpreted imaging which showed left MCA occlusion I agree with the radiologist interpretation   Cardiac Monitoring: / EKG:  The patient was maintained on a cardiac monitor.  I personally viewed and interpreted the cardiac monitored which showed an underlying rhythm of: Sinus rhythm   Consultations Obtained:  I requested consultation with the neurologist, Dr. Lorrin Goodell,  and discussed lab and imaging findings as well as pertinent plan - they recommend: TNKase and IR thrombectomy   Problem List / ED Course / Critical interventions / Medication management  Patient presents for acute onset of strokelike symptoms.  This occurred partially 1.5 hours  prior to arrival.  He prior to that, he was in his normal state of health.  Wife noted weakness and aphasia at home.  EMS noted the loss of consciousness.  On arrival, patient is awake but aphasic.  He is maintaining his airway.  He has right-sided weakness and left-sided gaze preference.  Patient arrives as a code stroke was taken directly to [place].  CT scan showed dense left MCA.  Patient remained awake and maintained normal respirations and SpO2 100% on room air while in the scanner.  Neurology was communicating with patient's family for consent for TNKase and IR intervention.  Patient was taken to IR for acute intervention.   Social Determinants of Health:  Has access to outpatient care  CRITICAL CARE Performed by: Godfrey Pick   Total critical care time: 32 minutes  Critical care time was exclusive of separately billable procedures and treating other patients.  Critical care was necessary to treat or prevent imminent or life-threatening deterioration.  Critical care was time spent personally by me on the following activities: development of treatment plan with patient and/or surrogate as well as nursing, discussions with consultants, evaluation of patient's response to treatment,  examination of patient, obtaining history from patient or surrogate, ordering and performing treatments and interventions, ordering and review of laboratory studies, ordering and review of radiographic studies, pulse oximetry and re-evaluation of patient's condition.         Final Clinical Impression(s) / ED Diagnoses Final diagnoses:  Acute ischemic left MCA stroke [facility])    Rx / DC Orders ED Discharge Orders     None         Godfrey Pick, MD 06/20/22 1039

## 2022-06-20 NOTE — Progress Notes (Signed)
SLP Cancellation Note  Patient Details Name: CORNELUIS ALLSTON MRN: [id number] DOB: 10/18/1955   Cancelled treatment:       Reason Eval/Treat Not Completed: Patient at procedure or test/unavailable   Elvina Sidle, M.S., CCC-SLP 06/20/2022, 1:00 PM

## 2022-06-20 NOTE — Anesthesia Procedure Notes (Signed)
Arterial Line Insertion Start/End12/23/2023 10:46 AM, 06/20/2022 10:49 AM Performed by: Effie Berkshire, MD, anesthesiologist  Patient location: Pre-op. Preanesthetic checklist: patient identified, IV checked, site marked, risks and benefits discussed, surgical consent, monitors and equipment checked, pre-op evaluation, timeout performed and anesthesia consent Lidocaine 1% used for infiltration Left, radial was placed Catheter size: 20 G Hand hygiene performed  and maximum sterile barriers used   Attempts: 2 Procedure performed without using ultrasound guided technique. Following insertion, dressing applied and Biopatch. Post procedure assessment: normal and unchanged  Post procedure complications: unsuccessful attempts and second provider assisted. Patient tolerated the procedure well with no immediate complications. Additional procedure comments: CRNA x1, MDA x1.

## 2022-06-20 NOTE — Anesthesia Preprocedure Evaluation (Addendum)
Anesthesia Evaluation  Patient identified by MRN, date of birth, ID band Patient confused    Reviewed: Unable to perform ROS - Chart review onlyPreop documentation limited or incomplete due to emergent nature of procedure.  Airway Mallampati: Unable to assess       Dental  (+) Upper Dentures   Pulmonary former smoker    + decreased breath sounds      Cardiovascular hypertension, Pt. on medications and Pt. on home beta blockers + CAD, + Peripheral Vascular Disease and +CHF   Rhythm:Regular Rate:Normal  Echo: 1. Left ventricular ejection fraction, by estimation, is 20 to 25%. Left  ventricular ejection fraction by 3D volume is 25 %. Left ventricular  ejection fraction by 2D MOD biplane is 22.6 %. The left ventricle has  severely decreased function. The left  ventricle demonstrates global hypokinesis. Left ventricular diastolic  parameters are consistent with Grade II diastolic dysfunction  (pseudonormalization). Elevated left atrial pressure. The E/e' is 56.   2. Right ventricular systolic function is normal. The right ventricular  size is normal. Tricuspid regurgitation signal is inadequate for assessing  PA pressure.   3. Left atrial size was mild to moderately dilated.   4. The mitral valve is grossly normal. Mild to moderate mitral valve  regurgitation. No evidence of mitral stenosis.   5. The aortic valve is tricuspid. Aortic valve regurgitation is not  visualized. No aortic stenosis is present.   6. The inferior vena cava is normal in size with greater than 50%  respiratory variability, suggesting right atrial pressure of 3 mmHg.     Neuro/Psych negative neurological ROS  negative psych ROS   GI/Hepatic negative GI ROS, Neg liver ROS,,,  Endo/Other  diabetes, Type 2, Oral Hypoglycemic Agents    Renal/GU Renal disease     Musculoskeletal negative musculoskeletal ROS (+)    Abdominal   Peds   Hematology negative hematology ROS (+)   Anesthesia Other Findings   Reproductive/Obstetrics                             Anesthesia Physical Anesthesia Plan  ASA: 4 and emergent  Anesthesia Plan: General   Post-op Pain Management:    Induction: Intravenous  PONV Risk Score and Plan: 2 and Ondansetron and Treatment may vary due to age or medical condition  Airway Management Planned: Oral ETT  Additional Equipment: Arterial line  Intra-op Plan:   Post-operative Plan: Possible Post-op intubation/ventilation  Informed Consent:      Only emergency history available and History available from chart only  Plan Discussed with: CRNA  Anesthesia Plan Comments:         Anesthesia Quick Evaluation

## 2022-06-20 NOTE — Progress Notes (Signed)
Patient has a pacemaker that is not MRI compatible.  MRI order has been canceled, CT head ordered for 10 AM tomorrow morning which would also be the 24-hour post TNKase CT head.  [place] Pager Number [phone]

## 2022-06-20 NOTE — ED Notes (Signed)
TNK given

## 2022-06-21 ENCOUNTER — Inpatient Hospital Stay (HOSPITAL_COMMUNITY): Payer: HMO

## 2022-06-21 DIAGNOSIS — Z950 Presence of cardiac pacemaker: Secondary | ICD-10-CM

## 2022-06-21 DIAGNOSIS — I255 Ischemic cardiomyopathy: Secondary | ICD-10-CM

## 2022-06-21 DIAGNOSIS — I63512 Cerebral infarction due to unspecified occlusion or stenosis of left middle cerebral artery: Secondary | ICD-10-CM | POA: Diagnosis not present

## 2022-06-21 LAB — CBC WITH DIFFERENTIAL/PLATELET
Abs Immature Granulocytes: 0.03 10*3/uL (ref 0.00–0.07)
Basophils Absolute: 0.1 10*3/uL (ref 0.0–0.1)
Basophils Relative: 1 %
Eosinophils Absolute: 0.1 10*3/uL (ref 0.0–0.5)
Eosinophils Relative: 1 %
HCT: 37.2 % — ABNORMAL LOW (ref 39.0–52.0)
Hemoglobin: 12.3 g/dL — ABNORMAL LOW (ref 13.0–17.0)
Immature Granulocytes: 0 %
Lymphocytes Relative: 13 %
Lymphs Abs: 1.3 10*3/uL (ref 0.7–4.0)
MCH: 30.6 pg (ref 26.0–34.0)
MCHC: 33.1 g/dL (ref 30.0–36.0)
MCV: 92.5 fL (ref 80.0–100.0)
Monocytes Absolute: 1 10*3/uL (ref 0.1–1.0)
Monocytes Relative: 10 %
Neutro Abs: 7.5 10*3/uL (ref 1.7–7.7)
Neutrophils Relative %: 75 %
Platelets: 130 10*3/uL — ABNORMAL LOW (ref 150–400)
RBC: 4.02 MIL/uL — ABNORMAL LOW (ref 4.22–5.81)
RDW: 15.1 % (ref 11.5–15.5)
WBC: 10.1 10*3/uL (ref 4.0–10.5)
nRBC: 0 % (ref 0.0–0.2)

## 2022-06-21 LAB — BASIC METABOLIC PANEL
Anion gap: 11 (ref 5–15)
BUN: 15 mg/dL (ref 8–23)
CO2: 20 mmol/L — ABNORMAL LOW (ref 22–32)
Calcium: 8.1 mg/dL — ABNORMAL LOW (ref 8.9–10.3)
Chloride: 107 mmol/L (ref 98–111)
Creatinine, Ser: 1.33 mg/dL — ABNORMAL HIGH (ref 0.61–1.24)
GFR, Estimated: 58 mL/min — ABNORMAL LOW (ref >60)
Glucose, Bld: 82 mg/dL (ref 70–99)
Potassium: 3.7 mmol/L (ref 3.5–5.1)
Sodium: 138 mmol/L (ref 135–145)

## 2022-06-21 LAB — GLUCOSE, CAPILLARY
Glucose-Capillary: 94 mg/dL (ref 70–99)
Glucose-Capillary: 79 mg/dL (ref 70–99)
Glucose-Capillary: 81 mg/dL (ref 70–99)
Glucose-Capillary: 168 mg/dL — ABNORMAL HIGH (ref 70–99)
Glucose-Capillary: 118 mg/dL — ABNORMAL HIGH (ref 70–99)
Glucose-Capillary: 138 mg/dL — ABNORMAL HIGH (ref 70–99)

## 2022-06-21 LAB — RAPID URINE DRUG SCREEN, HOSP PERFORMED
Amphetamines: NOT DETECTED
Barbiturates: NOT DETECTED
Benzodiazepines: NOT DETECTED
Cocaine: NOT DETECTED
Opiates: NOT DETECTED
Tetrahydrocannabinol: NOT DETECTED

## 2022-06-21 LAB — LIPID PANEL
Cholesterol: 111 mg/dL (ref 0–200)
HDL: 14 mg/dL — ABNORMAL LOW (ref >40)
LDL Cholesterol: UNDETERMINED mg/dL (ref 0–99)
Total CHOL/HDL Ratio: 7.9 RATIO
Triglycerides: 553 mg/dL — ABNORMAL HIGH (ref <150)
VLDL: UNDETERMINED mg/dL (ref 0–40)

## 2022-06-21 LAB — MRSA NEXT GEN BY PCR, NASAL: MRSA by PCR Next Gen: NOT DETECTED

## 2022-06-21 MED ORDER — ENOXAPARIN SODIUM 40 MG/0.4ML IJ SOSY
40.0000 mg | PREFILLED_SYRINGE | INTRAMUSCULAR | Status: DC
  Administered 2022-06-21 – 2022-06-24 (×4): 40 mg via SUBCUTANEOUS
  Filled 2022-06-21 (×4): qty 0.4

## 2022-06-21 MED ORDER — ASPIRIN 81 MG PO TBEC
81.0000 mg | DELAYED_RELEASE_TABLET | Freq: Every day | ORAL | Status: DC
  Administered 2022-06-21 – 2022-06-25 (×5): 81 mg via ORAL
  Filled 2022-06-21 (×5): qty 1

## 2022-06-21 MED ORDER — CLOPIDOGREL BISULFATE 75 MG PO TABS
75.0000 mg | ORAL_TABLET | Freq: Every day | ORAL | Status: DC
  Administered 2022-06-21 – 2022-06-25 (×5): 75 mg via ORAL
  Filled 2022-06-21 (×5): qty 1

## 2022-06-21 MED ORDER — ATORVASTATIN CALCIUM 80 MG PO TABS
80.0000 mg | ORAL_TABLET | Freq: Every day | ORAL | Status: DC
  Administered 2022-06-21 – 2022-06-25 (×5): 80 mg via ORAL
  Filled 2022-06-21 (×5): qty 1

## 2022-06-21 NOTE — Progress Notes (Signed)
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  S/p thrombectomy by  Dr. Estanislado Pandy on 06/20/22.  " Status post complete revascularization of the the occluded left middle cerebral artery M1 segment  achieving a TICI 3 revascularization. Right CFA approach abandoned due to occluded distal abdominal aorta and the left common iliac  artery."  Subjective:  Patient sitting in bed, NAD, spouse and RN at bedside.  Expression aphasia, no receptive aphasia, able to move all 4 and follow commands.   Allergies: Patient has no known allergies.  Medications: Prior to Admission medications   Medication Sig Start Date End Date Taking? Authorizing Provider  aspirin EC 81 MG tablet Take 81 mg by mouth daily. Swallow whole.   Yes [provider]  atorvastatin (LIPITOR) 80 MG tablet TAKE ONE TABLET BY MOUTH DAILY Patient taking differently: Take 80 mg by mouth daily. 06/19/21  Yes Lelon Perla, MD  carvedilol (COREG) 12.5 MG tablet TAKE ONE TABLET BY MOUTH TWICE A DAY WITH A MEAL Patient taking differently: Take 12.5 mg by mouth 2 (two) times daily with a meal. 11/10/21  Yes Crenshaw, Denice Bors, MD  empagliflozin (JARDIANCE) 10 MG TABS tablet Take 1 tablet (10 mg total) by mouth daily. 12/16/21  Yes Lelon Perla, MD  furosemide (LASIX) 20 MG tablet TAKE ONE TABLET BY MOUTH DAILY Patient taking differently: Take 20 mg by mouth daily. 07/10/21  Yes Lelon Perla, MD  glipiZIDE (GLUCOTROL XL) 5 MG 24 hr tablet Take 5 mg by mouth 2 (two) times daily.   Yes [provider]  guaiFENesin-dextromethorphan (ROBITUSSIN DM) 100-10 MG/5ML syrup Take 5 mLs by mouth every 4 (four) hours as needed for cough.   Yes [provider]  LANTUS SOLOSTAR 100 UNIT/ML Solostar Pen Inject 28 Units into the skin at bedtime. 06/13/20   Yes [provider]  metFORMIN (GLUCOPHAGE-XR) 500 MG 24 hr tablet Take 1,000 mg by mouth 2 (two) times daily.   Yes [provider]  Multiple Vitamins-Minerals (MULTIVITAMIN ADULT) CHEW Chew 1 tablet by mouth at bedtime.   Yes [provider]  nitroGLYCERIN (NITROSTAT) 0.4 MG SL tablet Place 0.4 mg under the tongue every 5 (five) minutes as needed for chest pain. 06/14/20  Yes [provider]  Omega-3 Fatty Acids (FISH OIL PO) Take 1 capsule by mouth daily.   Yes [provider]  OVER THE COUNTER MEDICATION Take 1 tablet by mouth every 12 (twelve) hours as needed (cough). Gen[place]a Cold Crush tablets   Yes [provider]  sacubitril-valsartan (ENTRESTO) 24-26 MG Take 1 tablet by mouth 2 (two) times daily. 08/06/21  Yes Lelon Perla, MD     Vital Signs: BP (!) 110/52   Pulse 73   Temp 99.2 F (37.3 C) (Oral)   Resp 17   Wt 175 lb 14.8 oz (79.8 kg)   SpO2 95%   BMI 29.28 kg/m   Physical Exam Vitals reviewed.  Constitutional:      General: He is not in acute distress.    Appearance: He is not ill-appearing.  HENT:     Head: Normocephalic.  Eyes:     Extraocular Movements: Extraocular movements intact.     Pupils: Pupils are equal, round, and reactive to light.  Cardiovascular:     Rate and Rhythm: Normal rate and regular rhythm.  Pulmonary:  Effort: Pulmonary effort is normal.  Skin:    General: Skin is warm and dry.     Comments: Positive dressing on R CFA puncture site. Site is unremarkable with no erythema, edema, tenderness, bleeding or drainage. Minimal amount of old, dry blood noted on the dressing. Dressing otherwise clean, dry, and intact. R DP dopplerable    Positive dressing on R RA puncture site. Site is unremarkable with no erythema, edema, tenderness, bleeding or drainage. Minimal amount of old, dry blood noted on the dressing. Dressing otherwise clean, dry, and intact. R RA 2+   Neurological:     Mental  Status: He is alert.     Comments: Alert, awake Expression aphasia  PERRL  EOMs intact No facial asymmetry. Motor power intact all 4 Distal pulses R DP dopplerable, R RA 2+    Psychiatric:        Mood and Affect: Mood normal.        Behavior: Behavior normal.     Imaging: CT HEAD WO CONTRAST (5MM)  Result Date: 06/21/2022 CLINICAL DATA:  Neuro deficit with acute stroke suspected EXAM: CT HEAD WITHOUT CONTRAST TECHNIQUE: Contiguous axial images were obtained from the base of the skull through the vertex without intravenous contrast. RADIATION DOSE REDUCTION: This exam was performed according to the departmental dose-optimization program which includes automated exposure control, adjustment of the mA and/or kV according to patient size and/or use of iterative reconstruction technique. COMPARISON:  CT from yesterday FINDINGS: Brain: Cytotoxic edema in the superficial left temporal lobe and insula. Separate area of cytotoxic edema in the parasagittal left frontal lobe anteriorly. Mild involvement at the level of the left low parietal cortex. No hemorrhage, hydrocephalus, or midline shift. Vascular: No hyperdense vessel or unexpected calcification. Skull: Normal. Negative for fracture or focal lesion. Sinuses/Orbits: No acute finding. 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 No acute hemorrhage. Electronically Signed   By: Jorje Guild M.D.   On: 06/21/2022 11:49   CT ANGIO HEAD NECK W WO CM  Result Date: 06/20/2022 CLINICAL DATA:  Stroke suspected. EXAM: CT ANGIOGRAPHY HEAD AND NECK TECHNIQUE: Multidetector CT imaging of the head and neck was performed using the standard protocol during bolus administration of intravenous contrast. Multiplanar CT image reconstructions and MIPs were obtained to evaluate the vascular anatomy. Carotid stenosis measurements (when applicable) are obtained utilizing NASCET criteria, using  the distal internal carotid diameter as the denominator. RADIATION DOSE REDUCTION: This exam was performed according to the departmental dose-optimization program which includes automated exposure control, adjustment of the mA and/or kV according to patient size and/or use of iterative reconstruction technique. CONTRAST:  4m OMNIPAQUE IOHEXOL 350 MG/ML SOLN COMPARISON:  Same-day noncontrast CT head, CTA head/neck, and catheter angiogram FINDINGS: CTA NECK FINDINGS Aortic arch: There is calcified plaque in the aortic arch. The origins of the major branch vessels are patent. The subclavian arteries are patent to the level imaged. Right carotid system: The right common, internal, and external carotid arteries are patent with mixed plaque at bifurcation but no hemodynamically significant stenosis or occlusion. There is no dissection or aneurysm. Left carotid system: The left common, internal, and external carotid arteries are patent with mild plaque at the bifurcation but no hemodynamically significant stenosis or occlusion there is no dissection or aneurysm. Vertebral arteries: There is calcified plaque in the right V1 segment resulting in severe stenosis. The remainder of the right vertebral artery is patent with no other hemodynamically significant  stenosis or occlusion. The left vertebral artery is patent, without hemodynamically significant stenosis or occlusion. There is no dissection or aneurysm. Skeleton: There is no acute osseous abnormality or suspicious osseous lesion. There is no visible canal hematoma. Other neck: The soft tissues of the neck are unremarkable. Upper chest: There is emphysema in the lung apices. Review of the MIP images confirms the above findings CTA HEAD FINDINGS Anterior circulation: There is calcified plaque in the intracranial ICAs without hemodynamically significant stenosis or occlusion There has been interval recanalization of the previously occluded left M1 segment which now  appears widely patent. The distal MCA branches appear well perfused. The right M1 segment and distal branches are patent. The bilateral ACAs are patent, without proximal stenosis or occlusion. The anterior communicating artery is normal. There is no aneurysm or AVM. Posterior circulation: There is unchanged calcified plaque in the right V4 segment resulting in multifocal mild-to-moderate stenosis. The non dominant left V4 segment is diminutive after the PICA origin. The basilar artery is patent. The major cerebellar arteries appear patent. The bilateral PCAs are patent, without proximal stenosis or occlusion. Left larger than right posterior communicating arteries are identified. There is no aneurysm or AVM. Venous sinuses: Patent. Anatomic variants: None. Review of the MIP images confirms the above findings IMPRESSION: 1. Interval recanalization of the left M1 segment which now appears widely patent. The distal branch vessels now appear well perfused. 2. Otherwise, no significant interval change since the CTA head/neck from earlier the same day. 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 Electronically Signed   By: Valetta Mole M.D.   On: 06/20/2022 17:19   CT HEAD WO CONTRAST (5MM)  Result Date: 06/20/2022 CLINICAL DATA:  Neuro deficit. Stroke suspected. Patient became aphasic earlier today. EXAM: CT HEAD WITHOUT CONTRAST TECHNIQUE: Contiguous axial images were obtained from the base of the skull through the vertex without intravenous contrast. RADIATION DOSE REDUCTION: This exam was performed according to the departmental dose-optimization program which includes automated exposure control, adjustment of the mA and/or kV according to patient size and/or use of iterative reconstruction technique. COMPARISON:  Code stroke CT head obtained earlier today, 06/20/2022 at 10 a.m. FINDINGS: Brain: Normal  ventricles. No hydrocephalus. No parenchymal or extra-axial masses or mass effect. No acute or chronic infarct. No abnormal extra-axial fluid collections. No intracranial hemorrhage. Vascular: Hyperdense left middle cerebral artery is similar in appearance to the earlier exam. Skull: Normal. Negative for fracture or focal lesion. Sinuses/Orbits: Dependent fluid in the left maxillary sinus and a posterior left ethmoid air cell. Mild scattered ethmoid sinus mucosal thickening. Globes and orbits are unremarkable. Other: None. IMPRESSION: 1. No evidence of acute infarct.  No intracranial hemorrhage. 2. No change from the CT head performed earlier today. Patient has since undergone CT angiography and neurointerventional procedures. Electronically Signed   By: Lajean Manes M.D.   On: 06/20/2022 16:52   ECHOCARDIOGRAM COMPLETE  Result Date: 06/20/2022    ECHOCARDIOGRAM REPORT   Patient Name:   Richard Flynn Date of Exam: 06/20/2022 Medical Rec #:  [id number]     Height:       65.0 in Accession #:    [id number]    Weight:       175.9 lb Date of Birth:  Nov 23, 1955     BSA:          1.873 m Patient Age:    66 years  BP:           116/64 mmHg Patient Gender: M             HR:           62 bpm. Exam Location:  Inpatient Procedure: 2D Echo, Cardiac Doppler, Color Doppler and Intracardiac            Opacification Agent Indications:    Stroke  History:        Patient has prior history of Echocardiogram examinations, most                 recent 06/15/2020. CHF, CAD, Defibrillator, PAD; Risk                 Factors:Diabetes, Dyslipidemia and Former Smoker. CKD.  Sonographer:    Clayton Lefort RDCS (AE) Referring Phys: [id number] [place]  1. Left ventricular ejection fraction, by estimation, is 25 to 30%. The left ventricle has severely decreased function. The left ventricle demonstrates global hypokinesis. The left ventricular internal cavity size was moderately dilated. There is mild left ventricular  hypertrophy. Left ventricular diastolic parameters are consistent with Grade I diastolic dysfunction (impaired relaxation).  2. Right ventricular systolic function is mildly reduced. The right ventricular size is normal. Tricuspid regurgitation signal is inadequate for assessing PA pressure.  3. Left atrial size was mild to moderately dilated.  4. The mitral valve is normal in structure. Trivial mitral valve regurgitation. No evidence of mitral stenosis.  5. The aortic valve is tricuspid. There is mild calcification of the aortic valve. Aortic valve regurgitation is not visualized. No aortic stenosis is present.  6. The inferior vena cava is normal in size with greater than 50% respiratory variability, suggesting right atrial pressure of 3 mmHg. Conclusion(s)/Recommendation(s): No intracardiac source of embolism detected on this transthoracic study. Consider a transesophageal echocardiogram to exclude cardiac source of embolism if clinically indicated. No left ventricular mural or apical thrombus/thrombi. FINDINGS  Left Ventricle: Left ventricular ejection fraction, by estimation, is 25 to 30%. The left ventricle has severely decreased function. The left ventricle demonstrates global hypokinesis. Definity contrast agent was given IV to delineate the left ventricular endocardial borders. 3D left ventricular ejection fraction analysis performed but not reported based on interpreter judgement due to suboptimal tracking. The left ventricular internal cavity size was moderately dilated. There is mild left ventricular hypertrophy. Left ventricular diastolic parameters are consistent with Grade I diastolic dysfunction (impaired relaxation). Right Ventricle: The right ventricular size is normal. No increase in right ventricular wall thickness. Right ventricular systolic function is mildly reduced. Tricuspid regurgitation signal is inadequate for assessing PA pressure. Left Atrium: Left atrial size was mild to moderately  dilated. Right Atrium: Right atrial size was normal in size. Pericardium: There is no evidence of pericardial effusion. Mitral Valve: The mitral valve is normal in structure. Trivial mitral valve regurgitation. No evidence of mitral valve stenosis. Tricuspid Valve: The tricuspid valve is normal in structure. Tricuspid valve regurgitation is trivial. No evidence of tricuspid stenosis. Aortic Valve: The aortic valve is tricuspid. There is mild calcification of the aortic valve. Aortic valve regurgitation is not visualized. No aortic stenosis is present. Aortic valve mean gradient measures 3.0 mmHg. Aortic valve peak gradient measures 5.5 mmHg. Aortic valve area, by VTI measures 3.35 cm. Pulmonic Valve: The pulmonic valve was normal in structure. Pulmonic valve regurgitation is not visualized. No evidence of pulmonic stenosis. Aorta: The aortic root is normal in size and structure. Ascending aorta measurements are  within normal limits for age when indexed to body surface area. Venous: The inferior vena cava is normal in size with greater than 50% respiratory variability, suggesting right atrial pressure of 3 mmHg. IAS/Shunts: The atrial septum is grossly normal. Additional Comments: A device lead is visualized in the right ventricle.  LEFT VENTRICLE PLAX 2D LVIDd:         6.50 cm      Diastology LVIDs:         5.70 cm      LV e' medial:    4.35 cm/s LV PW:         1.30 cm      LV E/e' medial:  19.7 LV IVS:        1.40 cm      LV e' lateral:   4.90 cm/s LVOT diam:     2.20 cm      LV E/e' lateral: 17.5 LV SV:         89 LV SV Index:   48 LVOT Area:     3.80 cm                              3D Volume EF: LV Volumes (MOD)            3D EF:        38 % LV vol d, MOD A4C: 262.0 ml LV EDV:       210 ml LV vol s, MOD A4C: 159.0 ml LV ESV:       131 ml LV SV MOD A4C:     262.0 ml LV SV:        79 ml RIGHT VENTRICLE RV Basal diam:  3.00 cm RV S prime:     14.70 cm/s TAPSE (M-mode): 3.0 cm LEFT ATRIUM             Index         RIGHT ATRIUM           Index LA diam:        4.90 cm 2.62 cm/m   RA Area:     17.70 cm LA Vol (A2C):   90.2 ml 48.15 ml/m  RA Volume:   48.90 ml  26.11 ml/m LA Vol (A4C):   59.4 ml 31.71 ml/m LA Biplane Vol: 74.5 ml 39.77 ml/m  AORTIC VALVE AV Area (Vmax):    3.51 cm AV Area (Vmean):   3.42 cm AV Area (VTI):     3.35 cm AV Vmax:           117.00 cm/s AV Vmean:          77.000 cm/s AV VTI:            0.267 m AV Peak Grad:      5.5 mmHg AV Mean Grad:      3.0 mmHg LVOT Vmax:         108.00 cm/s LVOT Vmean:        69.200 cm/s LVOT VTI:          0.235 m LVOT/AV VTI ratio: 0.88  AORTA Ao Root diam: 3.20 cm Ao Asc diam:  3.80 cm MITRAL VALVE MV Area (PHT): 2.95 cm     SHUNTS MV Decel Time: 257 msec     Systemic VTI:  0.24 m MV E velocity: 85.90 cm/s   Systemic Diam: 2.20 cm MV A velocity: 127.00 cm/s MV E/A ratio:  0.68  Cherlynn Kaiser MD Electronically signed by Cherlynn Kaiser MD Signature Date/Time: 06/20/2022/3:52:41 PM    Final    CT ANGIO HEAD NECK W WO CM (CODE STROKE)  Result Date: 06/20/2022 CLINICAL DATA:  Neuro deficit with acute stroke suspected EXAM: CT ANGIOGRAPHY HEAD AND NECK TECHNIQUE: Multidetector CT imaging of the head and neck was performed using the standard protocol during bolus administration of intravenous contrast. Multiplanar CT image reconstructions and MIPs were obtained to evaluate the vascular anatomy. Carotid stenosis measurements (when applicable) are obtained utilizing NASCET criteria, using the distal internal carotid diameter as the denominator. RADIATION DOSE REDUCTION: This exam was performed according to the departmental dose-optimization program which includes automated exposure control, adjustment of the mA and/or kV according to patient size and/or use of iterative reconstruction technique. CONTRAST:  1m OMNIPAQUE IOHEXOL 350 MG/ML SOLN COMPARISON:  Head CT from earlier today FINDINGS: CTA NECK FINDINGS Aortic arch: Atheromatous plaque Right carotid system:  Calcified plaque at the bifurcation without flow limiting stenosis or ulceration Left carotid system: Atheromatous plaque at the bifurcation, mainly calcified. No ulceration or flow limiting stenosis. Vertebral arteries: The right vertebral artery is dominant. Bulky calcified plaque at the right V1 segment with high-grade narrowing, patent lumen is not visible for measurement purposes. Skeleton: Cervical spine degeneration asymmetric to the left. C2-3 central protrusion impinging on the cord. Other neck: No evidence of mass or inflammation. Upper chest: Negative Review of the MIP images confirms the above findings CTA HEAD FINDINGS Anterior circulation: Calcified plaque along the carotid siphons. Abrupt cut off of the left M1 segment consistent with embolus seen on prior CT without contrast. Underfilling of downstream reconstitute branches. Posterior circulation: Atheromatous plaque along the V4 segment on the right. No branch occlusion, beading, or aneurysm. Venous sinuses: Unremarkable Anatomic variants: Hypoplastic left A1 segment. Review of the MIP images confirms the above findings Finding is known to neurology team, patient is headed to embolectomy. IMPRESSION: 1. Emergent large vessel occlusion at the left M1 segment. There is underfilling of downstream reconstituted vessels. No proximal flow limiting stenosis or ulceration in this distribution. 2. 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 Electronically Signed   By: JJorje GuildM.D.   On: 06/20/2022 10:34   CT HEAD CODE STROKE WO CONTRAST  Result Date: 06/20/2022 CLINICAL DATA:  Code stroke.  Right-sided weakness and aphasia EXAM: CT HEAD WITHOUT CONTRAST TECHNIQUE: Contiguous axial images were obtained from the base of the skull through the vertex without intravenous contrast. RADIATION DOSE REDUCTION: This exam was performed according to the departmental dose-optimization program which  includes automated exposure control, adjustment of the mA and/or kV according to patient size and/or use of iterative reconstruction technique. COMPARISON:  None Available. FINDINGS: Brain: No evidence of acute infarction, hemorrhage, hydrocephalus, extra-axial collection or mass lesion/mass effect. Vascular: Hyperdense left distal M1 segment. Skull: Normal. Negative for fracture or focal lesion. Sinuses/Orbits: Partial bilateral mastoid opacification. A page has been placed to attending neurologist. ASPECTS ([facility] Early [facility] Score) - Ganglionic level infarction (caudate, lentiform nuclei, internal capsule, insula, M1-M3 cortex): 7 - Supraganglionic infarction (M4-M6 cortex): 3 Total score (0-10 with 10 being normal): 10 IMPRESSION: 1. Hyperdense left MCA, CTA is pending.  Aspects is 10. 2. No intracranial hemorrhage. Electronically Signed   By: JJorje GuildM.D.   On: 06/20/2022 10:08    Labs:  CBC: Recent Labs    06/20/22 1548 06/21/22 0452  WBC 10.2 10.1  HGB 14.1 12.3*  HCT 41.4 37.2*  PLT 162 130*    COAGS: Recent Labs    06/20/22 1548  INR 1.1  APTT 30    BMP: Recent Labs    06/20/22 1548 06/21/22 0452  NA 137 138  K 4.9 3.7  CL 107 107  CO2 19* 20*  GLUCOSE 140* 82  BUN 21 15  CALCIUM 8.1* 8.1*  CREATININE 1.31* 1.33*  GFRNONAA 59* 58*    LIVER FUNCTION TESTS: Recent Labs    06/20/22 1548  BILITOT 1.1  AST 27  ALT 15  ALKPHOS 77  PROT 6.4*  ALBUMIN 3.3*    Assessment and Plan:  66 y.o. male with acute stroke due to occluded left middle cerebral artery mid M1 segment.  S/p thrombectomy by  Dr. Estanislado Pandy on 06/20/22.   " Status post complete revascularization of the the occluded left middle cerebral artery M1 segment  achieving a TICI 3 revascularization. Right CFA approach abandoned due to occluded distal abdominal aorta and the left common iliac  artery."   Expression aphasia, able to say yes or no appropriately  Puncture sites  stable, no appreciable pseudoaneurysm.  R DP dopplerable, R RA 2+   Has non MRI compatible pacemaker, CT head today:  Acute infarcts left MCA inferior division cortex and anterior left frontal cortex (ACA branch territory). No acute hemorrhage.  Further treatment plan per Stroke team  Appreciate and agree with the plan.  Please call NIR for questions and concerns.    Electronically Signed: Tera Mater, PA-C 06/21/2022, 12:50 PM   I spent a total of 15 Minutes at the the patient's bedside AND on the patient's hospital floor or unit, greater than 50% of which was counseling/coordinating care for s/p L MCA thrombectomy.   This chart was dictated using voice recognition software.  Despite best efforts to proofread,  errors can occur which can change the documentation meaning.

## 2022-06-21 NOTE — Progress Notes (Addendum)
STROKE TEAM PROGRESS NOTE   INTERVAL HISTORY Patient is seen in his room with his mother and sister at the bedside.  Yesterday, he had a sudden collapse with aphasia and right facial droop.  He was noted to be initially hypoglycemic by EMS, but when dextrose was given and blood glucose rose, symptoms remained.  Head CT on arrival to the ED was negative for acute ICH, so TNK was administered.  Patient was then found to have left MCA M1 occlusion on CTA head and was taken to IR for mechanical thrombectomy.  TICI 3 flow was achieved.  Patient has been hemodynamically stable overnight but continues to have some expressive aphasia.  Vitals:   06/21/22 1000 06/21/22 1100 06/21/22 1200 06/21/22 1300  BP: 111/77  (!) 110/52 123/65  Pulse: 77 82 73 81  Resp: '16 18 17 '$ (!) 21  Temp:   99.2 F (37.3 C)   TempSrc:   Oral   SpO2: 95% 94% 95% 98%  Weight:       CBC:  Recent Labs  Lab 06/20/22 1548 06/21/22 0452  WBC 10.2 10.1  NEUTROABS 7.8* 7.5  HGB 14.1 12.3*  HCT 41.4 37.2*  MCV 91.4 92.5  PLT 162 841*   Basic Metabolic Panel:  Recent Labs  Lab 06/20/22 1548 06/21/22 0452  NA 137 138  K 4.9 3.7  CL 107 107  CO2 19* 20*  GLUCOSE 140* 82  BUN 21 15  CREATININE 1.31* 1.33*  CALCIUM 8.1* 8.1*   Lipid Panel:  Recent Labs  Lab 06/21/22 0452  CHOL 111  TRIG 553*  HDL 14*  CHOLHDL 7.9  VLDL UNABLE TO CALCULATE IF TRIGLYCERIDE OVER 400 mg/dL  LDLCALC UNABLE TO CALCULATE IF TRIGLYCERIDE OVER 400 mg/dL   HgbA1c: No results for input(s): "HGBA1C" in the last 168 hours. Urine Drug Screen:  Recent Labs  Lab 06/21/22 0710  LABOPIA NONE DETECTED  COCAINSCRNUR NONE DETECTED  LABBENZ NONE DETECTED  AMPHETMU NONE DETECTED  THCU NONE DETECTED  LABBARB NONE DETECTED    Alcohol Level  Recent Labs  Lab 06/20/22 1548  ETH <10    IMAGING past 24 hours CT HEAD WO CONTRAST (5MM)  Result Date: 06/21/2022 CLINICAL DATA:  Neuro deficit with acute stroke suspected EXAM: CT HEAD  WITHOUT CONTRAST TECHNIQUE: Contiguous axial images were obtained from the base of the skull through the vertex without intravenous contrast. RADIATION DOSE REDUCTION: This exam was performed according to the departmental dose-optimization program which includes automated exposure control, adjustment of the mA and/or kV according to patient size and/or use of iterative reconstruction technique. COMPARISON:  CT from yesterday FINDINGS: Brain: Cytotoxic edema in the superficial left temporal lobe and insula. Separate area of cytotoxic edema in the parasagittal left frontal lobe anteriorly. Mild involvement at the level of the left low parietal cortex. No hemorrhage, hydrocephalus, or midline shift. Vascular: No hyperdense vessel or unexpected calcification. Skull: Normal. Negative for fracture or focal lesion. Sinuses/Orbits: No acute finding. 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 No acute hemorrhage. Electronically Signed   By: Jorje Guild M.D.   On: 06/21/2022 11:49   CT ANGIO HEAD NECK W WO CM  Result Date: 06/20/2022 CLINICAL DATA:  Stroke suspected. EXAM: CT ANGIOGRAPHY HEAD AND NECK TECHNIQUE: Multidetector CT imaging of the head and neck was performed using the standard protocol during bolus administration of intravenous contrast. Multiplanar CT image reconstructions and MIPs were obtained to evaluate the vascular anatomy.  Carotid stenosis measurements (when applicable) are obtained utilizing NASCET criteria, using the distal internal carotid diameter as the denominator. RADIATION DOSE REDUCTION: This exam was performed according to the departmental dose-optimization program which includes automated exposure control, adjustment of the mA and/or kV according to patient size and/or use of iterative reconstruction technique. CONTRAST:  40m OMNIPAQUE IOHEXOL 350 MG/ML SOLN COMPARISON:  Same-day noncontrast CT head, CTA  head/neck, and catheter angiogram FINDINGS: CTA NECK FINDINGS Aortic arch: There is calcified plaque in the aortic arch. The origins of the major branch vessels are patent. The subclavian arteries are patent to the level imaged. Right carotid system: The right common, internal, and external carotid arteries are patent with mixed plaque at bifurcation but no hemodynamically significant stenosis or occlusion. There is no dissection or aneurysm. Left carotid system: The left common, internal, and external carotid arteries are patent with mild plaque at the bifurcation but no hemodynamically significant stenosis or occlusion there is no dissection or aneurysm. Vertebral arteries: There is calcified plaque in the right V1 segment resulting in severe stenosis. The remainder of the right vertebral artery is patent with no other hemodynamically significant stenosis or occlusion. The left vertebral artery is patent, without hemodynamically significant stenosis or occlusion. There is no dissection or aneurysm. Skeleton: There is no acute osseous abnormality or suspicious osseous lesion. There is no visible canal hematoma. Other neck: The soft tissues of the neck are unremarkable. Upper chest: There is emphysema in the lung apices. Review of the MIP images confirms the above findings CTA HEAD FINDINGS Anterior circulation: There is calcified plaque in the intracranial ICAs without hemodynamically significant stenosis or occlusion There has been interval recanalization of the previously occluded left M1 segment which now appears widely patent. The distal MCA branches appear well perfused. The right M1 segment and distal branches are patent. The bilateral ACAs are patent, without proximal stenosis or occlusion. The anterior communicating artery is normal. There is no aneurysm or AVM. Posterior circulation: There is unchanged calcified plaque in the right V4 segment resulting in multifocal mild-to-moderate stenosis. The non  dominant left V4 segment is diminutive after the PICA origin. The basilar artery is patent. The major cerebellar arteries appear patent. The bilateral PCAs are patent, without proximal stenosis or occlusion. Left larger than right posterior communicating arteries are identified. There is no aneurysm or AVM. Venous sinuses: Patent. Anatomic variants: None. Review of the MIP images confirms the above findings IMPRESSION: 1. Interval recanalization of the left M1 segment which now appears widely patent. The distal branch vessels now appear well perfused. 2. Otherwise, no significant interval change since the CTA head/neck from earlier the same day. 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 Electronically Signed   By: PValetta MoleM.D.   On: 06/20/2022 17:19   CT HEAD WO CONTRAST (5MM)  Result Date: 06/20/2022 CLINICAL DATA:  Neuro deficit. Stroke suspected. Patient became aphasic earlier today. EXAM: CT HEAD WITHOUT CONTRAST TECHNIQUE: Contiguous axial images were obtained from the base of the skull through the vertex without intravenous contrast. RADIATION DOSE REDUCTION: This exam was performed according to the departmental dose-optimization program which includes automated exposure control, adjustment of the mA and/or kV according to patient size and/or use of iterative reconstruction technique. COMPARISON:  Code stroke CT head obtained earlier today, 06/20/2022 at 10 a.m. FINDINGS: Brain: Normal ventricles. No hydrocephalus. No parenchymal or extra-axial masses or mass effect. No acute or chronic  infarct. No abnormal extra-axial fluid collections. No intracranial hemorrhage. Vascular: Hyperdense left middle cerebral artery is similar in appearance to the earlier exam. Skull: Normal. Negative for fracture or focal lesion. Sinuses/Orbits: Dependent fluid in the left maxillary sinus and a posterior  left ethmoid air cell. Mild scattered ethmoid sinus mucosal thickening. Globes and orbits are unremarkable. Other: None. IMPRESSION: 1. No evidence of acute infarct.  No intracranial hemorrhage. 2. No change from the CT head performed earlier today. Patient has since undergone CT angiography and neurointerventional procedures. Electronically Signed   By: Lajean Manes M.D.   On: 06/20/2022 16:52   ECHOCARDIOGRAM COMPLETE  Result Date: 06/20/2022    ECHOCARDIOGRAM REPORT   Patient Name:   Richard Flynn Date of Exam: 06/20/2022 Medical Rec #:  [id number]     Height:       65.0 in Accession #:    [id number]    Weight:       175.9 lb Date of Birth:  01/27/1956     BSA:          1.873 m Patient Age:    16 years      BP:           116/64 mmHg Patient Gender: M             HR:           62 bpm. Exam Location:  Inpatient Procedure: 2D Echo, Cardiac Doppler, Color Doppler and Intracardiac            Opacification Agent Indications:    Stroke  History:        Patient has prior history of Echocardiogram examinations, most                 recent 06/15/2020. CHF, CAD, Defibrillator, PAD; Risk                 Factors:Diabetes, Dyslipidemia and Former Smoker. CKD.  Sonographer:    Clayton Lefort RDCS (AE) Referring Phys: [id number] Moore  1. Left ventricular ejection fraction, by estimation, is 25 to 30%. The left ventricle has severely decreased function. The left ventricle demonstrates global hypokinesis. The left ventricular internal cavity size was moderately dilated. There is mild left ventricular hypertrophy. Left ventricular diastolic parameters are consistent with Grade I diastolic dysfunction (impaired relaxation).  2. Right ventricular systolic function is mildly reduced. The right ventricular size is normal. Tricuspid regurgitation signal is inadequate for assessing PA pressure.  3. Left atrial size was mild to moderately dilated.  4. The mitral valve is normal in structure. Trivial mitral valve  regurgitation. No evidence of mitral stenosis.  5. The aortic valve is tricuspid. There is mild calcification of the aortic valve. Aortic valve regurgitation is not visualized. No aortic stenosis is present.  6. The inferior vena cava is normal in size with greater than 50% respiratory variability, suggesting right atrial pressure of 3 mmHg. Conclusion(s)/Recommendation(s): No intracardiac source of embolism detected on this transthoracic study. Consider a transesophageal echocardiogram to exclude cardiac source of embolism if clinically indicated. No left ventricular mural or apical thrombus/thrombi. FINDINGS  Left Ventricle: Left ventricular ejection fraction, by estimation, is 25 to 30%. The left ventricle has severely decreased function. The left ventricle demonstrates global hypokinesis. Definity contrast agent was given IV to delineate the left ventricular endocardial borders. 3D left ventricular ejection fraction analysis performed but not reported based on interpreter judgement due to suboptimal tracking. The left ventricular internal cavity size was moderately  dilated. There is mild left ventricular hypertrophy. Left ventricular diastolic parameters are consistent with Grade I diastolic dysfunction (impaired relaxation). Right Ventricle: The right ventricular size is normal. No increase in right ventricular wall thickness. Right ventricular systolic function is mildly reduced. Tricuspid regurgitation signal is inadequate for assessing PA pressure. Left Atrium: Left atrial size was mild to moderately dilated. Right Atrium: Right atrial size was normal in size. Pericardium: There is no evidence of pericardial effusion. Mitral Valve: The mitral valve is normal in structure. Trivial mitral valve regurgitation. No evidence of mitral valve stenosis. Tricuspid Valve: The tricuspid valve is normal in structure. Tricuspid valve regurgitation is trivial. No evidence of tricuspid stenosis. Aortic Valve: The aortic  valve is tricuspid. There is mild calcification of the aortic valve. Aortic valve regurgitation is not visualized. No aortic stenosis is present. Aortic valve mean gradient measures 3.0 mmHg. Aortic valve peak gradient measures 5.5 mmHg. Aortic valve area, by VTI measures 3.35 cm. Pulmonic Valve: The pulmonic valve was normal in structure. Pulmonic valve regurgitation is not visualized. No evidence of pulmonic stenosis. Aorta: The aortic root is normal in size and structure. Ascending aorta measurements are within normal limits for age when indexed to body surface area. Venous: The inferior vena cava is normal in size with greater than 50% respiratory variability, suggesting right atrial pressure of 3 mmHg. IAS/Shunts: The atrial septum is grossly normal. Additional Comments: A device lead is visualized in the right ventricle.  LEFT VENTRICLE PLAX 2D LVIDd:         6.50 cm      Diastology LVIDs:         5.70 cm      LV e' medial:    4.35 cm/s LV PW:         1.30 cm      LV E/e' medial:  19.7 LV IVS:        1.40 cm      LV e' lateral:   4.90 cm/s LVOT diam:     2.20 cm      LV E/e' lateral: 17.5 LV SV:         89 LV SV Index:   48 LVOT Area:     3.80 cm                              3D Volume EF: LV Volumes (MOD)            3D EF:        38 % LV vol d, MOD A4C: 262.0 ml LV EDV:       210 ml LV vol s, MOD A4C: 159.0 ml LV ESV:       131 ml LV SV MOD A4C:     262.0 ml LV SV:        79 ml RIGHT VENTRICLE RV Basal diam:  3.00 cm RV S prime:     14.70 cm/s TAPSE (M-mode): 3.0 cm LEFT ATRIUM             Index        RIGHT ATRIUM           Index LA diam:        4.90 cm 2.62 cm/m   RA Area:     17.70 cm LA Vol (A2C):   90.2 ml 48.15 ml/m  RA Volume:   48.90 ml  26.11 ml/m LA Vol (A4C):   59.4 ml  31.71 ml/m LA Biplane Vol: 74.5 ml 39.77 ml/m  AORTIC VALVE AV Area (Vmax):    3.51 cm AV Area (Vmean):   3.42 cm AV Area (VTI):     3.35 cm AV Vmax:           117.00 cm/s AV Vmean:          77.000 cm/s AV VTI:             0.267 m AV Peak Grad:      5.5 mmHg AV Mean Grad:      3.0 mmHg LVOT Vmax:         108.00 cm/s LVOT Vmean:        69.200 cm/s LVOT VTI:          0.235 m LVOT/AV VTI ratio: 0.88  AORTA Ao Root diam: 3.20 cm Ao Asc diam:  3.80 cm MITRAL VALVE MV Area (PHT): 2.95 cm     SHUNTS MV Decel Time: 257 msec     Systemic VTI:  0.24 m MV E velocity: 85.90 cm/s   Systemic Diam: 2.20 cm MV A velocity: 127.00 cm/s MV E/A ratio:  0.68 Cherlynn Kaiser MD Electronically signed by Cherlynn Kaiser MD Signature Date/Time: 06/20/2022/3:52:41 PM    Final     PHYSICAL EXAM General: Alert, well-developed, well-nourished patient in no acute distress Respiratory: Regular, unlabored respirations on supplemental O2  NEURO:  Mental Status: Responds to name and is able to follow simple commands Speech/Language: speech is without dysarthria but with significant expressive aphasia.  Naming and repetition are impaired, and patient is only able to speak in single words   Cranial Nerves:  II: PERRL.  III, IV, VI: EOMI.  V: Sensation is intact to light touch and symmetrical to face.  VII: Smile is symmetrical.   VIII: hearing intact to voice. IX, X:Phonation is normal.  OZ:[id number] shrug 5/5. XII: tongue is midline without fasciculations. Motor: 5/5 strength to all muscle groups tested.  Tone: is normal and bulk is normal Sensation- Intact to light touch bilaterally.  Gait- deferred   ASSESSMENT/PLAN Richard Flynn is a 66 y.o. male with history of CAD, CHF, cardiomyopathy, pacemaker, CKD stage III, diabetes, hypertension, hyperlipidemia and PAD presenting with a sudden collapse with aphasia and right facial droop.  He was noted to be initially hypoglycemic by EMS, but when dextrose was given and blood glucose rose, symptoms remained.  Head CT on arrival to the ED was negative for acute ICH, so TNK was administered.  Patient was then found to have left MCA M1 occlusion on CTA head and was taken to IR for mechanical  thrombectomy.  TICI 3 flow was achieved.  Patient has been hemodynamically stable overnight but continues to have some expressive aphasia.  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  Hyperdense left MCA  CTA head & neck LVO at left M1 segment, high-grade right V1 stenosis Post IR CT no hemorrhage Post IR CTA head and neck left M1 patent  Follow-up CT 12/24 acute infarcts in left MCA and ACA territory MRI unable to perform due to incompatible pacemaker 2D Echo EF 25 to 30%  Need pacemaker interrogation to rule out afib LDL pending TG 553 HgbA1c pending VTE prophylaxis -SCDs aspirin 81 mg daily prior to admission, now on ASA and plavix DAPT.  Therapy recommendations: Pending Disposition: Pending  Cardiomyopathy CHF EF 25 to 30% Home meds including Delene Loll, Coreg May related to current stroke Discussed potential anticoagulation and TEE to rule out cardiac thrombus with cardiology Will consider anticoagulation if needed.  Hypertension Home meds: Carvedilol 12.5 mg twice daily Stable on the low end Long-term BP goal normotensive  Hyperlipidemia Home meds: Atorvastatin 80 mg daily, resumed in hospital LDL pending, goal < 70 TG 553 Resume home Lipitor 80 Continue statin at discharge  Diabetes type II Controlled Home meds: Jardiance 10 mg daily, glipizide 5 mg twice daily, Lantus 28 units daily, metformin 1000 mg twice daily Hypoglycemia on presentation received a D10 HgbA1c pending, goal < 7.0 CBGs SSI Close PCP follow-up as outpatient  Dysphagia Pass swallow Now on dys 3 and thin liquid Speech on board  Other Stroke Risk Factors Advanced Age >/= 56  Former cigarette smoker CAD PAD  Other Active Problems Pacemaker CKD 3a,  creatinine 1.33  Hospital day # [place] , MSN, AGACNP-BC [facility] See Amion for schedule and pager information 06/21/2022 1:57 PM  ATTENDING NOTE: I reviewed above note and agree with the assessment and plan. Pt was seen and examined.   66 year old male with history of hypertension, hyperlipidemia, diabetes, PAD, CAD, CHF, CKD 3, pacemaker admitted for right-sided weakness, right facial droop and aphasia with collapse.  Intubated in the ER, glucose 62 received a D10 and then recheck 154.  CT showed left MCA hyperdense sign status post TNK.  CT head and neck left M1 occlusion, right V1 high-grade stenosis, bilateral ICA siphon and bulb atherosclerosis.  Status post IR with TICI3.  Post IR CT no hemorrhage.  Post IR CTA head and neck showed left M1 patent.  EF 25 to 30%.  LDL pending, A1c pending, TG 553.  24-hour CT showed left MCA and ACA scattered infarcts.  Not able to have MRI due to pacemaker.  Creatinine 1.33.  BP lowish, on IV fluid.  Etiology for patient stroke not quite clear, could be from cardiomyopathy with low EF or occult A-fib.  Will check pacemaker interrogation to rule out A-fib.  We also contact cardiology regarding cardiomyopathy, anticoagulation if needed.  BP improving, no BP meds needed at this time.  Passed swallow, on dysphagia 3 and on thin liquid.  Start aspirin and Plavix DAPT and resume Lipitor 80.  PT/OT recommend CIR.  For detailed assessment and plan, please refer to above/below as I have made changes wherever appropriate.   Rosalin Hawking, MD PhD Stroke Neurology 06/21/2022 7:17 PM  This patient is critically ill due to left MCA and ACA infarct status post TNK and mechanical thrombectomy, cardiomyopathy, low EF, and at significant risk of neurological worsening, death form recurrent stroke, hemorrhagic transformation, bleeding from TNK, heart failure. This patient's care requires constant monitoring of vital signs, hemodynamics, respiratory and cardiac monitoring, review of multiple databases, neurological assessment,  discussion with family, other specialists and medical decision making of high complexity. I spent 40 minutes of neurocritical care time in the care of this patient. I had long discussion with patient, sister and mom at bedside, updated pt current condition, treatment plan and potential prognosis, and answered all the questions.  They expressed understanding and appreciation.        To contact Stroke Continuity provider, please refer to [email or web address] After hours, contact General Neurology

## 2022-06-21 NOTE — Evaluation (Signed)
Physical Therapy Evaluation Patient Details Name: Richard Flynn MRN: [id number] DOB: Aug 11, 1955 Today's Date: 06/21/2022  History of Present Illness  Richard Flynn is a 66 y.o. male who presented to the ED on 06/20/2022 via EMS for evaluation of acute onset of right-sided weakness and aphasia. Pt found to have emergent L M1 occlusion and underwent a thrombectomy. Also received TNK.  PMH: coronary artery disease, chronic systolic CHF, cardiomyopathy, CKD stage III, type 2 diabetes mellitus, hyperlipidemia, hypertension, and PAD   Clinical Impression  Pt admitted with above. Pt presenting with expressive aphasia, impaired processing, sequencing, decreased safety awareness and insight to deficits. Pt able to ambulate this date however demo's increased falls risk and requires AD for safe ambulation at this time. Pt was indep and driving PTA. Recommend AIR Upon d/c for maximal functional recovery. Acute PT to cont to follow.       Recommendations for follow up therapy are one component of a multi-disciplinary discharge planning process, led by the attending physician.  Recommendations may be updated based on patient status, additional functional criteria and insurance authorization.  Follow Up Recommendations Acute inpatient rehab (3hours/day) (will continue to assess pending progress)      Assistance Recommended at Discharge Frequent or constant Supervision/Assistance  Patient can return home with the following  A little help with walking and/or transfers;A little help with bathing/dressing/bathroom;Direct supervision/assist for medications management;Direct supervision/assist for financial management;Assist for transportation;Help with stairs or ramp for entrance    Equipment Recommendations  (TBD)  Recommendations for Other Services  Rehab consult    Functional Status Assessment Patient has had a recent decline in their functional status and demonstrates the ability to make significant  improvements in function in a reasonable and predictable amount of time.     Precautions / Restrictions Precautions Precautions: Fall Precaution Comments: aphasic Restrictions Weight Bearing Restrictions: No      Mobility  Bed Mobility Overal bed mobility: Needs Assistance Bed Mobility: Supine to Sit     Supine to sit: Min assist, HOB elevated     General bed mobility comments: max directional verbal cues to complete task, pt with difficulty getting R hip scooted forward requiring minA    Transfers Overall transfer level: Needs assistance   Transfers: Sit to/from Stand, Bed to chair/wheelchair/BSC Sit to Stand: Min assist   Step pivot transfers: Min assist       General transfer comment: minA to steady, pt initially sat right back down upon first stand. PT provided R HHA to help steady pt, pt able to sequence steps to chair and march in place with minA    Ambulation/Gait Ambulation/Gait assistance: Min assist, +2 safety/equipment Gait Distance (Feet): 100 Feet Assistive device: 1 person hand held assist, IV Pole Gait Pattern/deviations: Step-through pattern, Decreased stride length, Wide base of support Gait velocity: dec Gait velocity interpretation: <1.31 ft/sec, indicative of household ambulator   General Gait Details: pt initially attempted to amb with R HHA however demo'd step to pattern with short step height and length. Pt then placed L hand on IV pole and demod improved stability and reciprocal gait pattern  Stairs            Wheelchair Mobility    Modified Rankin (Stroke Patients Only) Modified Rankin (Stroke Patients Only) Pre-Morbid Rankin Score: No symptoms Modified Rankin: Moderately severe disability     Balance Overall balance assessment: Needs assistance Sitting-balance support: Feet supported, No upper extremity supported Sitting balance-Leahy Scale: Fair     Standing balance support:  During functional activity, Reliant on assistive  device for balance Standing balance-Leahy Scale: Fair Standing balance comment: requires external assist for dynamic balance                             Pertinent Vitals/Pain      Home Living Family/patient expects to be discharged to:: Private residence Living Arrangements: Spouse/significant other Available Help at Discharge: Family;Available 24 hours/day Type of Home: House Home Access: Stairs to enter Entrance Stairs-Rails: Left Entrance Stairs-Number of Steps: 3   Home Layout: One level Home Equipment: Conservation officer, nature (2 wheels);BSC/3in1      Prior Function Prior Level of Function : Independent/Modified Independent                     Hand Dominance   Dominant Hand: Right    Extremity/Trunk Assessment   Upper Extremity Assessment Upper Extremity Assessment:  (R UE mildly weaker than the L)    Lower Extremity Assessment Lower Extremity Assessment:  (R LE mildly weaker than the L)    Cervical / Trunk Assessment Cervical / Trunk Assessment: Normal  Communication   Communication: Receptive difficulties;Expressive difficulties  Cognition Arousal/Alertness: Awake/alert Behavior During Therapy: Flat affect Overall Cognitive Status: Impaired/Different from baseline Area of Impairment: Orientation, Following commands, Safety/judgement, Awareness, Problem solving                 Orientation Level: Disoriented to, Time, Situation, Place     Following Commands: Follows one step commands with increased time Safety/Judgement: Decreased awareness of safety, Decreased awareness of deficits Awareness: Intellectual Problem Solving: Slow processing, Decreased initiation, Difficulty sequencing, Requires verbal cues, Requires tactile cues General Comments: pt aphasic however was unable to answer yes/no orientation questions via shaking of head accurately. Pt stating it was a restaurant and its was 4th of July. When asked if you put socks on your hands  he shook his head yes. Pt with impaired processing as well        General Comments General comments (skin integrity, edema, etc.): VSS, pt on 3 LO2 via [place]    Exercises     Assessment/Plan    PT Assessment Patient needs continued PT services  PT Problem List Decreased strength;Decreased range of motion;Decreased activity tolerance;Decreased balance;Decreased mobility;Decreased coordination;Decreased cognition;Decreased knowledge of use of DME;Decreased safety awareness       PT Treatment Interventions DME instruction;Gait training;Stair training;Functional mobility training;Therapeutic activities;Therapeutic exercise;Balance training;Neuromuscular re-education;Cognitive remediation    PT Goals (Current goals can be found in the Care Plan section)  Acute Rehab PT Goals PT Goal Formulation: With patient Time For Goal Achievement: 07/05/22 Potential to Achieve Goals: Good    Frequency Min 4X/week     Co-evaluation               AM-PAC PT "6 Clicks" Mobility  Outcome Measure Help needed turning from your back to your side while in a flat bed without using bedrails?: A Little Help needed moving from lying on your back to sitting on the side of a flat bed without using bedrails?: A Little Help needed moving to and from a bed to a chair (including a wheelchair)?: A Little Help needed standing up from a chair using your arms (e.g., wheelchair or bedside chair)?: A Lot Help needed to walk in hospital room?: A Lot Help needed climbing 3-5 steps with a railing? : A Lot 6 Click Score: 15    End of Session Equipment Utilized  During Treatment: Oxygen;Gait belt Activity Tolerance: Patient tolerated treatment well Patient left: in chair;with call bell/phone within reach;with chair alarm set Nurse Communication: Mobility status PT Visit Diagnosis: Unsteadiness on feet (R26.81);Muscle weakness (generalized) (M62.81);Difficulty in walking, not elsewhere classified (R26.2)    Time:  2956-2130 PT Time Calculation (min) (ACUTE ONLY): 37 min   Charges:   PT Evaluation $PT Eval Moderate Complexity: 1 Mod PT Treatments $Gait Training: 8-22 mins        Kittie Plater, PT, DPT Acute Rehabilitation Services Secure chat preferred Office #: [phone]   Berline Lopes 06/21/2022, 2:49 PM

## 2022-06-21 NOTE — Progress Notes (Signed)
PT Cancellation Note  Patient Details Name: Richard Flynn MRN: [id number] DOB: 1956-05-05   Cancelled Treatment:    Reason Eval/Treat Not Completed: Active bedrest order. PT to return as able once pt's mobility orders advance.   Kittie Plater, PT, DPT Acute Rehabilitation Services Secure chat preferred Office #: [phone]    Berline Lopes 06/21/2022, 11:49 AM

## 2022-06-21 NOTE — Evaluation (Signed)
Clinical/Bedside Swallow Evaluation Patient Details  Name: Richard Flynn MRN: [id number] Date of Birth: [birth year]  Today's Date: 06/21/2022 Time: SLP Start Time (ACUTE ONLY): 1144 SLP Stop Time (ACUTE ONLY): 1159 SLP Time Calculation (min) (ACUTE ONLY): 15 min  Past Medical History:  Past Medical History:  Diagnosis Date   CAD (coronary artery disease)    a. [facility] notes suggest cardiac cath 2013 showed occluded proximal RCA with left to right collaterals, 20-40% proximal LAD, 70% distal circumflex artery.   Cardiomyopathy ([place])    Chronic systolic CHF (congestive heart failure) (HCC)    CKD (chronic kidney disease), stage III (HCC)    Diabetes mellitus without complication (HCC)    Hyperlipidemia    Hypertension    NSVT (nonsustained ventricular tachycardia) (HCC)    mentioned in device interrogation   PAD (peripheral artery disease) ([place])     with aortoiliac occlusion (med rx per [facility])   Pancreatitis    Past Surgical History:  Past Surgical History:  Procedure Laterality Date   CHOLECYSTECTOMY     ICD IMPLANT     LEFT HEART CATH AND CORONARY ANGIOGRAPHY N/A 06/17/2020   Procedure: LEFT HEART CATH AND CORONARY ANGIOGRAPHY;  Surgeon: Jettie Booze, MD;  Location: [place] [facility];  Service: Cardiovascular;  Laterality: N/A;   RIGHT HEART CATH N/A 06/17/2020   Procedure: RIGHT HEART CATH;  Surgeon: Jettie Booze, MD;  Location: [place] [facility];  Service: Cardiovascular;  Laterality: N/A;   HPI:  Pt is a 66 y.o. male who presented on 06/20/2022 for evaluation of acute onset of right-sided weakness. CT head 12/23 negative for acute changes. Repeat CT head results not available at time of evaluation. PMH: coronary artery disease, chronic systolic CHF, cardiomyopathy, CKD stage III, type 2 diabetes mellitus, hyperlipidemia, hypertension, and PAD    Assessment / Plan / Recommendation  Clinical Impression  Pt was seen for bedside swallow evaluation  with his mother present who denied observance of signs of aspiration during their meals together. Pt was unable to participate in a complete oral mechanism exam, but lingual strength appeared reduced upon resistance. He exhibited mild oral residue and intermittent anterior spillage; however, his swallow mechanism otherwise appeared [facility] and no s/s of aspiration were noted. A dysphagia 3 diet with thin liquids is recommended at this time and SLP will follow to ensure tolerance. SLP Visit Diagnosis: Dysphagia, unspecified (R13.10)    Aspiration Risk  Mild aspiration risk    Diet Recommendation Dysphagia 3 (Mech soft);Thin liquid   Liquid Administration via: Cup;Straw Medication Administration: Whole meds with puree (or crushed per tolerance) Supervision: Staff to assist with self feeding Compensations: Slow rate;Small sips/bites Postural Changes: Seated upright at 90 degrees    Other  Recommendations Oral Care Recommendations: Oral care BID    Recommendations for follow up therapy are one component of a multi-disciplinary discharge planning process, led by the attending physician.  Recommendations may be updated based on patient status, additional functional criteria and insurance authorization.  Follow up Recommendations  (TBD)      Assistance Recommended at Discharge    Functional Status Assessment Patient has had a recent decline in their functional status and demonstrates the ability to make significant improvements in function in a reasonable and predictable amount of time.  Frequency and Duration min 2x/week  2 weeks       Prognosis Prognosis for Safe Diet Advancement: Good Barriers to Reach Goals: Language deficits      Swallow Study  General Date of Onset: 06/20/22 HPI: Pt is a 66 y.o. male who presented on 06/20/2022 for evaluation of acute onset of right-sided weakness. CT head 12/23 negative for acute changes. Repeat CT head results not available at time of evaluation.  PMH: coronary artery disease, chronic systolic CHF, cardiomyopathy, CKD stage III, type 2 diabetes mellitus, hyperlipidemia, hypertension, and PAD Type of Study: Bedside Swallow Evaluation Diet Prior to this Study: NPO Temperature Spikes Noted: No Respiratory Status: Nasal cannula History of Recent Intubation: No Behavior/Cognition: Alert;Cooperative;Pleasant mood;Doesn't follow directions;Requires cueing Oral Cavity Assessment: Within Functional Limits Oral Care Completed by SLP: No Oral Cavity - Dentition: Adequate natural dentition Vision: Functional for self-feeding Self-Feeding Abilities: Needs assist Patient Positioning: Upright in bed;Postural control adequate for testing Baseline Vocal Quality: Not observed Volitional Cough: Cognitively unable to elicit Volitional Swallow: Unable to elicit    Oral/Motor/Sensory Function Overall Oral Motor/Sensory Function: Mild impairment Lingual Strength: Reduced   Ice Chips Ice chips: Within functional limits Presentation: Spoon   Thin Liquid Thin Liquid: Impaired Presentation: Cup;Spoon Oral Phase Functional Implications: Right anterior spillage;Left anterior spillage    Nectar Thick Nectar Thick Liquid: Not tested   Honey Thick Honey Thick Liquid: Not tested   Puree Puree: Within functional limits Presentation: Spoon   Solid     Solid: Impaired Presentation: Self Fed Oral Phase Functional Implications: Oral residue     Richard Leth I. Richard Flynn, Richard Flynn, [place] Office number [phone]  Richard Flynn 06/21/2022,12:13 PM

## 2022-06-22 ENCOUNTER — Encounter (HOSPITAL_COMMUNITY): Payer: Self-pay | Admitting: Radiology

## 2022-06-22 DIAGNOSIS — I63512 Cerebral infarction due to unspecified occlusion or stenosis of left middle cerebral artery: Secondary | ICD-10-CM | POA: Diagnosis not present

## 2022-06-22 DIAGNOSIS — I255 Ischemic cardiomyopathy: Secondary | ICD-10-CM | POA: Diagnosis not present

## 2022-06-22 DIAGNOSIS — Z950 Presence of cardiac pacemaker: Secondary | ICD-10-CM | POA: Diagnosis not present

## 2022-06-22 LAB — BASIC METABOLIC PANEL
Anion gap: 12 (ref 5–15)
BUN: 13 mg/dL (ref 8–23)
CO2: 18 mmol/L — ABNORMAL LOW (ref 22–32)
Calcium: 7.9 mg/dL — ABNORMAL LOW (ref 8.9–10.3)
Chloride: 110 mmol/L (ref 98–111)
Creatinine, Ser: 1.22 mg/dL (ref 0.61–1.24)
GFR, Estimated: >60 mL/min (ref >60)
Glucose, Bld: 88 mg/dL (ref 70–99)
Potassium: 3.3 mmol/L — ABNORMAL LOW (ref 3.5–5.1)
Sodium: 140 mmol/L (ref 135–145)

## 2022-06-22 LAB — GLUCOSE, CAPILLARY
Glucose-Capillary: 93 mg/dL (ref 70–99)
Glucose-Capillary: 156 mg/dL — ABNORMAL HIGH (ref 70–99)
Glucose-Capillary: 129 mg/dL — ABNORMAL HIGH (ref 70–99)
Glucose-Capillary: 136 mg/dL — ABNORMAL HIGH (ref 70–99)
Glucose-Capillary: 149 mg/dL — ABNORMAL HIGH (ref 70–99)

## 2022-06-22 LAB — CBC
HCT: 32.9 % — ABNORMAL LOW (ref 39.0–52.0)
Hemoglobin: 10.9 g/dL — ABNORMAL LOW (ref 13.0–17.0)
MCH: 30 pg (ref 26.0–34.0)
MCHC: 33.1 g/dL (ref 30.0–36.0)
MCV: 90.6 fL (ref 80.0–100.0)
Platelets: 104 10*3/uL — ABNORMAL LOW (ref 150–400)
RBC: 3.63 MIL/uL — ABNORMAL LOW (ref 4.22–5.81)
RDW: 15.1 % (ref 11.5–15.5)
WBC: 12 10*3/uL — ABNORMAL HIGH (ref 4.0–10.5)
nRBC: 0 % (ref 0.0–0.2)

## 2022-06-22 LAB — LDL CHOLESTEROL, DIRECT: Direct LDL: 48 mg/dL (ref 0–99)

## 2022-06-22 LAB — TRIGLYCERIDES: Triglycerides: 134 mg/dL (ref <150)

## 2022-06-22 MED ORDER — POTASSIUM CHLORIDE 20 MEQ PO PACK
40.0000 meq | PACK | Freq: Once | ORAL | Status: AC
  Administered 2022-06-22: 40 meq via ORAL
  Filled 2022-06-22: qty 2

## 2022-06-22 MED ORDER — PANTOPRAZOLE SODIUM 40 MG PO TBEC
40.0000 mg | DELAYED_RELEASE_TABLET | Freq: Every day | ORAL | Status: DC
  Administered 2022-06-22 – 2022-06-24 (×3): 40 mg via ORAL
  Filled 2022-06-22 (×3): qty 1

## 2022-06-22 NOTE — Progress Notes (Addendum)
STROKE TEAM PROGRESS NOTE   INTERVAL HISTORY  Family at the bedside  K 3.3 will replace  WBC 12.0, afebrile will monitor Follows commands, still with expressive aphasia. No facial droop.  VSS. Will transfer out of the unit to the floor today    Vitals:   06/22/22 0500 06/22/22 0600 06/22/22 0700 06/22/22 0800  BP: 114/60 108/63 128/69   Pulse: 83 79 86   Resp: '20 19 20   '$ Temp:    98 F (36.7 C)  TempSrc:    Oral  SpO2: 98% 98% 97%   Weight:       CBC:  Recent Labs  Lab 06/20/22 1548 06/21/22 0452 06/22/22 0342  WBC 10.2 10.1 12.0*  NEUTROABS 7.8* 7.5  --   HGB 14.1 12.3* 10.9*  HCT 41.4 37.2* 32.9*  MCV 91.4 92.5 90.6  PLT 162 130* 104*    Basic Metabolic Panel:  Recent Labs  Lab 06/21/22 0452 06/22/22 0342  NA 138 140  K 3.7 3.3*  CL 107 110  CO2 20* 18*  GLUCOSE 82 88  BUN 15 13  CREATININE 1.33* 1.22  CALCIUM 8.1* 7.9*    Lipid Panel:  Recent Labs  Lab 06/21/22 0452 06/22/22 0342  CHOL 111  --   TRIG 553* 134  HDL 14*  --   CHOLHDL 7.9  --   VLDL UNABLE TO CALCULATE IF TRIGLYCERIDE OVER 400 mg/dL  --   LDLCALC UNABLE TO CALCULATE IF TRIGLYCERIDE OVER 400 mg/dL  --     HgbA1c: No results for input(s): "HGBA1C" in the last 168 hours. Urine Drug Screen:  Recent Labs  Lab 06/21/22 0710  LABOPIA NONE DETECTED  COCAINSCRNUR NONE DETECTED  LABBENZ NONE DETECTED  AMPHETMU NONE DETECTED  THCU NONE DETECTED  LABBARB NONE DETECTED     Alcohol Level  Recent Labs  Lab 06/20/22 1548  ETH <10     IMAGING past 24 hours CT HEAD WO CONTRAST (5MM)  Result Date: 06/21/2022 CLINICAL DATA:  Neuro deficit with acute stroke suspected EXAM: CT HEAD WITHOUT CONTRAST TECHNIQUE: Contiguous axial images were obtained from the base of the skull through the vertex without intravenous contrast. RADIATION DOSE REDUCTION: This exam was performed according to the departmental dose-optimization program which includes automated exposure control, adjustment of  the mA and/or kV according to patient size and/or use of iterative reconstruction technique. COMPARISON:  CT from yesterday FINDINGS: Brain: Cytotoxic edema in the superficial left temporal lobe and insula. Separate area of cytotoxic edema in the parasagittal left frontal lobe anteriorly. Mild involvement at the level of the left low parietal cortex. No hemorrhage, hydrocephalus, or midline shift. Vascular: No hyperdense vessel or unexpected calcification. Skull: Normal. Negative for fracture or focal lesion. Sinuses/Orbits: No acute finding. 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 No acute hemorrhage. Electronically Signed   By: Jorje Guild M.D.   On: 06/21/2022 11:49    PHYSICAL EXAM General: Alert, well-developed, well-nourished patient in no acute distress Respiratory: Regular, unlabored respirations on supplemental O2  NEURO:  Mental Status: Responds to name and is able to follow simple commands Speech/Language: speech is without dysarthria but with significant expressive aphasia.  Naming and repetition are impaired, and patient is only able to speak in single words   Cranial Nerves:  II: PERRL.  III, IV, VI: EOMI.  V: Sensation is intact to light touch and symmetrical to face.  VII: Smile is symmetrical.   VIII: hearing  intact to voice. IX, X:Phonation is normal.  FU:[id number] shrug 5/5. XII: tongue is midline without fasciculations. Motor: 5/5 strength to all muscle groups tested.  Tone: is normal and bulk is normal Sensation- Intact to light touch bilaterally.  Gait- deferred   ASSESSMENT/PLAN Richard Flynn is a 66 y.o. male with history of CAD, CHF, cardiomyopathy, pacemaker, CKD stage III, diabetes, hypertension, hyperlipidemia and PAD presenting with a sudden collapse with aphasia and right facial droop.  He was noted to be initially hypoglycemic by EMS, but when dextrose was given and blood  glucose rose, symptoms remained.  Head CT on arrival to the ED was negative for acute ICH, so TNK was administered.  Patient was then found to have left MCA M1 occlusion on CTA head and was taken to IR for mechanical thrombectomy.  TICI 3 flow was achieved.  Patient has been hemodynamically stable overnight but continues to have some expressive aphasia.  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  Hyperdense left MCA  CTA head & neck LVO at left M1 segment, high-grade right V1 stenosis Post IR CT no hemorrhage Post IR CTA head and neck left M1 patent  Follow-up CT 12/24 acute infarcts in left MCA and ACA territory MRI unable to perform due to incompatible pacemaker 2D Echo EF 25 to 30%  Need pacemaker interrogation to rule out afib LDL 48 HgbA1c pending UDS neg VTE prophylaxis -SCDs aspirin 81 mg daily prior to admission, now on ASA and plavix DAPT.  Therapy recommendations: CIR Disposition: Pending  Cardiomyopathy CHF EF 25 to 30% Home meds including Delene Loll, Coreg May related to current stroke Will discuss with cardiology regarding AC and TEE to rule out cardiac thrombus   Hypertension Home meds: Carvedilol 12.5 mg twice daily Stable on the low end Long-term BP goal normotensive  Hyperlipidemia Home meds: Atorvastatin 80 mg daily, resumed in hospital LDL 48, goal < 70 Resume home Lipitor 80 Continue statin at discharge  Diabetes type II Controlled Home meds: Jardiance 10 mg daily, glipizide 5 mg twice daily, Lantus 28 units daily, metformin 1000 mg twice daily Hypoglycemia on presentation received a D10 HgbA1c pending, goal < 7.0 CBGs SSI Close PCP follow-up as outpatient  Dysphagia Pass swallow Now on dys 3 and thin liquid Speech on board  Other Stroke Risk Factors Advanced Age >/= 26  Former cigarette  smoker CAD PAD  Other Active Problems Pacemaker CKD 3a,  creatinine 1.33-1.22 Leukocytosis 12.0. Afebrile will monitor  Anemia Hgb 10.9. Monitor  Hypokalemia K 3.3 Will replace and check in am   Hospital day # 2  Beulah Gandy DNP, ACNPC-AG  [facility] 06/22/2022 9:02 AM  ATTENDING NOTE: I reviewed above note and agree with the assessment and plan. Pt was seen and examined.   Wife at the bedside. Pt still has expressive aphasia but following commands and moving all extremities. No acute event overnight. On DAPT and statin. Will need pacer interrogation and discuss with cardiology regarding anticoagulation. Mild leukocytosis and hypokalemia, will supplement and monitoring  For detailed assessment and plan, please refer to above/below as I have made changes wherever appropriate.   Rosalin Hawking, MD PhD Stroke Neurology 06/22/2022 1:35 PM  This patient is critically ill due to left MCA and ACA infarct status post TNK and mechanical thrombectomy, cardiomyopathy, low EF, and at significant risk of neurological  worsening, death form recurrent stroke, hemorrhagic transformation, bleeding from TNK, heart failure. This patient's care requires constant monitoring of vital signs, hemodynamics, respiratory and cardiac monitoring, review of multiple databases, neurological assessment, discussion with family, other specialists and medical decision making of high complexity. I spent 35 minutes of neurocritical care time in the care of this patient. I had long discussion with wife at bedside, updated pt current condition, treatment plan and potential prognosis, and answered all the questions.  She expressed understanding and appreciation.   To contact Stroke Continuity provider, please refer to [email or web address] After hours, contact General Neurology

## 2022-06-23 ENCOUNTER — Encounter (HOSPITAL_COMMUNITY): Payer: Self-pay | Admitting: Neurology

## 2022-06-23 DIAGNOSIS — I63512 Cerebral infarction due to unspecified occlusion or stenosis of left middle cerebral artery: Secondary | ICD-10-CM | POA: Diagnosis not present

## 2022-06-23 LAB — BASIC METABOLIC PANEL
Anion gap: 11 (ref 5–15)
BUN: 20 mg/dL (ref 8–23)
CO2: 18 mmol/L — ABNORMAL LOW (ref 22–32)
Calcium: 8.3 mg/dL — ABNORMAL LOW (ref 8.9–10.3)
Chloride: 111 mmol/L (ref 98–111)
Creatinine, Ser: 1.36 mg/dL — ABNORMAL HIGH (ref 0.61–1.24)
GFR, Estimated: 57 mL/min — ABNORMAL LOW (ref >60)
Glucose, Bld: 132 mg/dL — ABNORMAL HIGH (ref 70–99)
Potassium: 3.6 mmol/L (ref 3.5–5.1)
Sodium: 140 mmol/L (ref 135–145)

## 2022-06-23 LAB — CBC
HCT: 31.5 % — ABNORMAL LOW (ref 39.0–52.0)
Hemoglobin: 10.7 g/dL — ABNORMAL LOW (ref 13.0–17.0)
MCH: 30.6 pg (ref 26.0–34.0)
MCHC: 34 g/dL (ref 30.0–36.0)
MCV: 90 fL (ref 80.0–100.0)
Platelets: 108 10*3/uL — ABNORMAL LOW (ref 150–400)
RBC: 3.5 MIL/uL — ABNORMAL LOW (ref 4.22–5.81)
RDW: 15.1 % (ref 11.5–15.5)
WBC: 12 10*3/uL — ABNORMAL HIGH (ref 4.0–10.5)
nRBC: 0 % (ref 0.0–0.2)

## 2022-06-23 LAB — GLUCOSE, CAPILLARY
Glucose-Capillary: 133 mg/dL — ABNORMAL HIGH (ref 70–99)
Glucose-Capillary: 133 mg/dL — ABNORMAL HIGH (ref 70–99)
Glucose-Capillary: 150 mg/dL — ABNORMAL HIGH (ref 70–99)
Glucose-Capillary: 177 mg/dL — ABNORMAL HIGH (ref 70–99)
Glucose-Capillary: 214 mg/dL — ABNORMAL HIGH (ref 70–99)

## 2022-06-23 MED ORDER — INSULIN ASPART 100 UNIT/ML IJ SOLN: 0.0000 [IU] | Freq: Three times a day (TID) | INTRAMUSCULAR | Status: DC

## 2022-06-23 MED ORDER — ORAL CARE MOUTH RINSE
15.0000 mL | OROMUCOSAL | Status: DC
  Administered 2022-06-23 – 2022-06-25 (×7): 15 mL via OROMUCOSAL

## 2022-06-23 MED ORDER — ORAL CARE MOUTH RINSE: 15.0000 mL | OROMUCOSAL | Status: DC | PRN

## 2022-06-23 MED ORDER — INSULIN ASPART 100 UNIT/ML IJ SOLN
0.0000 [IU] | Freq: Three times a day (TID) | INTRAMUSCULAR | Status: DC
  Administered 2022-06-23: 2 [IU] via SUBCUTANEOUS
  Administered 2022-06-23: 5 [IU] via SUBCUTANEOUS
  Administered 2022-06-23 – 2022-06-24 (×2): 3 [IU] via SUBCUTANEOUS
  Administered 2022-06-24: 2 [IU] via SUBCUTANEOUS
  Administered 2022-06-24: 5 [IU] via SUBCUTANEOUS
  Administered 2022-06-24: 8 [IU] via SUBCUTANEOUS
  Administered 2022-06-25: 3 [IU] via SUBCUTANEOUS
  Administered 2022-06-25 (×2): 5 [IU] via SUBCUTANEOUS

## 2022-06-23 MED ORDER — PNEUMOCOCCAL 20-VAL CONJ VACC 0.5 ML IM SUSY
0.5000 mL | PREFILLED_SYRINGE | INTRAMUSCULAR | Status: AC
  Administered 2022-06-25: 0.5 mL via INTRAMUSCULAR
  Filled 2022-06-23: qty 0.5

## 2022-06-23 MED ORDER — INFLUENZA VAC A&B SA ADJ QUAD 0.5 ML IM PRSY
0.5000 mL | PREFILLED_SYRINGE | INTRAMUSCULAR | Status: AC
  Administered 2022-06-25: 0.5 mL via INTRAMUSCULAR
  Filled 2022-06-23: qty 0.5

## 2022-06-23 NOTE — PMR Pre-admission (Signed)
PMR Admission Coordinator Pre-Admission Assessment  Patient: Richard Flynn is an 66 y.o., male MRN: [id number] DOB: [birth year] Height: _0  (165.1 cm) Weight: 81.4 kg  Insurance Information HMO: Yes    PPO:      PCP:      IPA:      80/20:      OTHER:  PRIMARY: [employer]      Policy#: [id number]      Subscriber: Patient CM Name: Marlowe Kays      Phone#: [phone]     Fax#: EPIC access Pre-Cert#: [id number] [place] from Marlowe Kays with [employer] with updates weekly, they have EPIC access      Employer:  Benefits:  Phone #: [phone]     Name:  Eff. Date: 06/29/2021-06/28/2022     Deduct: $0      Out of Pocket Max: $5,000 ($9.50 met)       CIR: $225/day co-pay for days 1-6, $0/day days 7-90      SNF: $0/day co pay days 1-20, $184 day co pay days 21-100, limited to 100 days/benefit period Outpatient: $20/visit co-pay limited by medical necessity      Home Health: 80% coverage, 20% co-insurance      DME: 80% coverage, 20% co insurance     Providers: patient choice  The "Data Collection Information Summary" for patients in Inpatient Rehabilitation Facilities with attached "Privacy Act [place] Records" was provided and verbally reviewed with: Patient and Family  Emergency Contact Information Contact Information     Name Relation Home Work Mobile   [place] Spouse [phone]  [phone]   [place] Daughter   [phone]       Current Medical History  Patient Admitting Diagnosis: CVA History of Present Illness: Richard Flynn is a 66 y.o. male who presented to [facility] ED on 06/20/2022 via EMS for evaluation of acute onset of right-sided weakness and aphasia. Pt found to have emergent L M1 occlusion and underwent a thrombectomy. Also received TNK. Follow-up CT 12/24 acute infarcts in left MCA and ACA territory. 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 2D Echo EF 25 to 30%. TEE needed to rule out cardiac thrombus. Needs pacemaker interrogation to rule out afib.   PMH: coronary artery disease, chronic systolic CHF, cardiomyopathy, CKD stage III, type 2 diabetes mellitus, hyperlipidemia, hypertension, and PAD. Therapies are recommending intensive rehab program.   Complete NIHSS TOTAL: 2  Patient's medical record from [facility] has been reviewed by the rehabilitation admission coordinator and physician.  Past Medical History  Past Medical History:  Diagnosis Date   CAD (coronary artery disease)    a. [facility] notes suggest cardiac cath 2013 showed occluded proximal RCA with left to right collaterals, 20-40% proximal LAD, 70% distal circumflex artery.   Cardiomyopathy ([place])    Chronic systolic CHF (congestive heart failure) (HCC)    CKD (chronic kidney disease), stage III (HCC)    Diabetes mellitus without complication (HCC)    Hyperlipidemia    Hypertension    NSVT (nonsustained ventricular tachycardia) ([place])    mentioned in device interrogation   PAD (peripheral artery disease) ([place])     with aortoiliac occlusion (med rx per [facility])   Pancreatitis     Has the patient had major surgery during 100 days prior to admission? No  Family History   family history includes CAD in his mother.  Current Medications  Current Facility-Administered Medications:    acetaminophen (TYLENOL) tablet 650 mg, 650 mg, Oral, Q4H PRN **OR** acetaminophen (  TYLENOL) 160 MG/5ML solution 650 mg, 650 mg, Per Tube, Q4H PRN **OR** acetaminophen (TYLENOL) suppository 650 mg, 650 mg, Rectal, Q4H PRN, Donnetta Simpers, MD   aspirin EC tablet 81 mg, 81 mg, Oral, Daily, Rosalin Hawking, MD, 81 mg at 06/25/22 0924   atorvastatin (LIPITOR) tablet 80 mg, 80 mg, Oral, Daily, Rosalin Hawking, MD, 80 mg at 06/25/22 4403   clopidogrel (PLAVIX) tablet 75 mg, 75 mg, Oral, Daily, Rosalin Hawking, MD, 75 mg at 06/25/22 0924   enoxaparin (LOVENOX) injection 40 mg, 40 mg, Subcutaneous, Q24H, Rosalin Hawking, MD, 40 mg at 06/24/22 2101   influenza vaccine adjuvanted (FLUAD) injection 0.5 mL,  0.5 mL, Intramuscular, Tomorrow-1000, Rathore, Vasundhra, MD   insulin aspart (novoLOG) injection 0-15 Units, 0-15 Units, Subcutaneous, TID WC & HS, Rosalin Hawking, MD, 3 Units at 06/25/22 1008   ipratropium-albuterol (DUONEB) 0.5-2.5 (3) MG/3ML nebulizer solution 3 mL, 3 mL, Nebulization, Q6H PRN, Donnetta Simpers, MD, 3 mL at 06/23/22 2023   nicotine (NICODERM CQ - dosed in mg/24 hours) patch 21 mg, 21 mg, Transdermal, Daily, Donnetta Simpers, MD, 21 mg at 06/25/22 4742   Oral care mouth rinse, 15 mL, Mouth Rinse, PRN, Rosalin Hawking, MD   Oral care mouth rinse, 15 mL, Mouth Rinse, 4 times per day, Rosalin Hawking, MD, 15 mL at 06/25/22 0924   pantoprazole (PROTONIX) EC tablet 40 mg, 40 mg, Oral, QHS, Rosalin Hawking, MD, 40 mg at 06/24/22 2100   pneumococcal 20-valent conjugate vaccine (PREVNAR 20) injection 0.5 mL, 0.5 mL, Intramuscular, Tomorrow-1000, Rathore, Vasundhra, MD   senna-docusate (Senokot-S) tablet 1 tablet, 1 tablet, Oral, QHS PRN, Donnetta Simpers, MD   sodium chloride flush (NS) 0.9 % injection 3 mL, 3 mL, Intravenous, Once, Godfrey Pick, MD  Patients Current Diet:  Diet Order             DIET DYS 3 Room service appropriate? Yes with Assist; Fluid consistency: Thin  Diet effective now                   Precautions / Restrictions Precautions Precautions: Fall Precaution Comments: aphasic Restrictions Weight Bearing Restrictions: No   Has the patient had 2 or more falls or a fall with injury in the past year? No  Prior Activity Level    Prior Functional Level Self Care: Did the patient need help bathing, dressing, using the toilet or eating? Independent  Indoor Mobility: Did the patient need assistance with walking from room to room (with or without device)? Independent  Stairs: Did the patient need assistance with internal or external stairs (with or without device)? Independent  Functional Cognition: Did the patient need help planning regular tasks such as  shopping or remembering to take medications? Independent  Patient Information Are you of Hispanic, Latino/a,or Spanish origin?: A. No, not of Hispanic, Latino/a, or Spanish origin What is your race?: A. White Do you need or want an interpreter to communicate with a doctor or health care staff?: 0. No  Patient's Response To:  Health Literacy and Transportation Is the patient able to respond to health literacy and transportation needs?: No Health Literacy - How often do you need to have someone help you when you read instructions, pamphlets, or other written material from your doctor or pharmacy?: Patient unable to respond  [place] / [place] Devices/Equipment: None Home Equipment: Conservation officer, nature (2 wheels), BSC/3in1  Prior Device Use: Indicate devices/aids used by the patient prior to current illness, exacerbation or injury? None of the  above  Current Functional Level Cognition  Overall Cognitive Status: Difficult to assess Orientation Level: Oriented X4 Following Commands: Follows one step commands with increased time, Follows one step commands inconsistently Safety/Judgement: Decreased awareness of safety, Decreased awareness of deficits General Comments: able to use objects appropriately; able to identify correct amounts of coins with written response however was not able to verbally identitfy them; note perseveration    Extremity Assessment (includes Sensation/Coordination)  Upper Extremity Assessment: RUE deficits/detail RUE Deficits / Details: using funcitonally howevernote increased processing time with funcitonal tasks, i.e. brushing teeth - very slow movement RUE Coordination: decreased fine motor  Lower Extremity Assessment: Defer to PT evaluation    ADLs  Overall ADL's : Needs assistance/impaired Eating/Feeding: Set up, Supervision/ safety Grooming: Minimal assistance Upper Body Bathing: Minimal assistance Lower Body Bathing: Minimal  assistance Upper Body Dressing : Minimal assistance Lower Body Dressing: Moderate assistance Toilet Transfer: Minimal assistance, Ambulation Toileting- Clothing Manipulation and Hygiene: Minimal assistance Functional mobility during ADLs: Minimal assistance    Mobility  Overal bed mobility: Needs Assistance Bed Mobility: Supine to Sit Supine to sit: Min assist, HOB elevated General bed mobility comments: OOB in chair    Transfers  Overall transfer level: Needs assistance Equipment used: None Transfers: Sit to/from Stand, Bed to chair/wheelchair/BSC Sit to Stand: Min guard Bed to/from chair/wheelchair/BSC transfer type:: Step pivot Step pivot transfers: Min assist General transfer comment: min guardc for safety    Ambulation / Gait / Stairs / Wheelchair Mobility  Ambulation/Gait Ambulation/Gait assistance: Counsellor (Feet): 300 Feet Assistive device: None Gait Pattern/deviations: Step-through pattern, Decreased stride length, Wide base of support General Gait Details: recipriocal gait pattern without AD, no LOB, able to follow ~50% of verbal cues for direction correctly. Gait velocity: dec Gait velocity interpretation: <1.31 ft/sec, indicative of household ambulator Stairs: Yes Stairs assistance: Min guard Stair Management: Two rails, Alternating pattern, Forwards Number of Stairs: 4 General stair comments: no LOB    Posture / Balance Balance Overall balance assessment: Needs assistance Sitting-balance support: Feet supported, No upper extremity supported Sitting balance-Leahy Scale: Good Standing balance support: During functional activity Standing balance-Leahy Scale: Fair Standing balance comment: requires external assist for dynamic balance    Special needs/care consideration Oxygen 2L in hospital   Previous Home Environment (from acute therapy documentation) Living Arrangements: Spouse/significant other Available Help at Discharge: Family, Available  24 hours/day Type of Home: House Home Layout: One level Home Access: Stairs to enter Entrance Stairs-Rails: Left Entrance Stairs-Number of Steps: 3 Bathroom Shower/Tub: Chiropodist: Handicapped height Bathroom Accessibility: No Home Care Services: No  Discharge Living Setting Plans for Discharge Living Setting: Patient's home Type of Home at Discharge: House Discharge Home Layout: One level Discharge Home Access: Stairs to enter Entrance Stairs-Rails: Left Entrance Stairs-Number of Steps: 3 Discharge Bathroom Shower/Tub: [place] unit Discharge Bathroom Toilet: Handicapped height Discharge Bathroom Accessibility: No Does the patient have any problems obtaining your medications?: No  Social/Family/Support Systems Anticipated Caregiver: Malena Peer Anticipated Ambulance person Information: [phone] Caregiver Availability: 24/7 Discharge Plan Discussed with Primary Caregiver: Yes Is Caregiver In Agreement with Plan?: Yes Does Caregiver/Family have Issues with Lodging/Transportation while Pt is in Rehab?: No  Goals Patient/Family Goal for Rehab: mod I PT, OT, Independent SLP Expected length of stay: 12-14 days Pt/Family Agrees to Admission and willing to participate: Yes Program Orientation Provided & Reviewed with Pt/Caregiver Including Roles  & Responsibilities: Yes  Barriers to Discharge: Insurance for SNF coverage  Decrease burden of Care through  IP rehab admission: OtherN/A  Possible need for SNF placement upon discharge: not anticipated  Patient Condition: I have reviewed medical records from [facility], spoken with  TOC , and patient and spouse. I met with patient at the bedside for inpatient rehabilitation assessment.  Patient will benefit from ongoing PT, OT, and SLP, can actively participate in 3 hours of therapy a day 5 days of the week, and can make measurable gains during the admission.  Patient will also benefit from the coordinated  team approach during an Inpatient Acute Rehabilitation admission.  The patient will receive intensive therapy as well as Rehabilitation physician, nursing, social worker, and care management interventions.  Due to safety, skin/wound care, disease management, medication administration, pain management, and patient education the patient requires 24 hour a day rehabilitation nursing.  The patient is currently min assist with mobility and basic ADLs.  Discharge setting and therapy post discharge at home with home health is anticipated.  Patient has agreed to participate in the Acute Inpatient Rehabilitation Program and will admit today.  Preadmission Screen Completed By:  Amanda Cockayne with updates by Michel Santee, 06/25/2022 11:25 AM ______________________________________________________________________   Discussed status with Dr. Dagoberto Ligas on 06/25/22  at 11:25 AM and received approval for admission today.  Admission Coordinator: Shann Medal, PT, DPT and Michel Santee, PT, time 11:25 AM Sudie Grumbling 06/25/22    Assessment/Plan: Diagnosis: Does the need for close, 24 hr/day Medical supervision in concert with the patient's rehab needs make it unreasonable for this patient to be served in a less intensive setting? 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 Yes Does the patient require coordinated care of a physician, rehab nurse, PT, OT, and SLP to address physical and functional deficits in the context of the above medical diagnosis(es)? Yes Addressing deficits in the following areas: balance, endurance, locomotion, strength, transferring, bowel/bladder control, bathing, dressing, feeding,  grooming, toileting, cognition, speech, and language Can the patient actively participate in an intensive therapy program of at least 3 hrs of therapy 5 days a week? Yes The potential for patient to make measurable gains while on inpatient rehab is good Anticipated functional outcomes upon discharge from inpatient rehab: modified independent PT, modified independent OT, modified independent SLP Estimated rehab length of stay to reach the above functional goals is: 12-14 days  Anticipated discharge destination: Home 10. Overall Rehab/Functional Prognosis: good   MD Signature:

## 2022-06-23 NOTE — Evaluation (Signed)
Occupational Therapy Evaluation Patient Details Name: Richard Flynn MRN: [id number] DOB: 11-23-1955 Today's Date: 06/23/2022   History of Present Illness Richard Flynn is a 66 y.o. male who presented to the ED on 06/20/2022 via EMS for evaluation of acute onset of right-sided weakness and aphasia. Pt found to have emergent L M1 occlusion and underwent a thrombectomy. Also received TNK. CT Acute infarcts left MCA inferior division cortex and anterior left  frontal cortex (ACA branch territory).  PMH: coronary artery disease, chronic systolic CHF, cardiomyopathy, CKD stage III, type 2 diabetes mellitus, hyperlipidemia, hypertension, and PAD   Clinical Impression   PTA pt lives at home independently with his wife. Pt is active and a retired Biomedical scientist from [employer]. Pt demonstrates a functional decline requiring min A for mobility and Mod A with ADL tasks due to deficits listed below. Pt is very motivated to become more independent. Feel pt would benefit greatly from intensive rehab at [facility] to maximize functional level of independence to facilitate safe DC home. Daughter present for session and agrees  with plan for [facility]. Acute Ot will continue to follow.      Recommendations for follow up therapy are one component of a multi-disciplinary discharge planning process, led by the attending physician.  Recommendations may be updated based on patient status, additional functional criteria and insurance authorization.   Follow Up Recommendations  Acute inpatient rehab (3hours/day)     Assistance Recommended at Discharge Frequent or constant Supervision/Assistance  Patient can return home with the following A little help with walking and/or transfers;A little help with bathing/dressing/bathroom;Assistance with cooking/housework;Direct supervision/assist for medications management;Direct supervision/assist for financial management;Assist for transportation;Help with stairs or ramp for entrance    Functional Status  Assessment  Patient has had a recent decline in their functional status and demonstrates the ability to make significant improvements in function in a reasonable and predictable amount of time.  Equipment Recommendations  None recommended by OT    Recommendations for Other Services Rehab consult     Precautions / Restrictions Precautions Precautions: Fall Precaution Comments: aphasic Restrictions Weight Bearing Restrictions: No      Mobility Bed Mobility               General bed mobility comments: OOB in chair    Transfers Overall transfer level: Needs assistance Equipment used: None Transfers: Sit to/from Stand, Bed to chair/wheelchair/BSC Sit to Stand: Min guard                  Balance Overall balance assessment: Needs assistance Sitting-balance support: Feet supported, No upper extremity supported Sitting balance-Leahy Scale: Good     Standing balance support: During functional activity Standing balance-Leahy Scale: Fair      Noted increased instability when distracted and when turning toward R                       ADL either performed or assessed with clinical judgement   ADL Overall ADL's : Needs assistance/impaired Eating/Feeding: Set up;Supervision/ safety   Grooming: Minimal assistance   Upper Body Bathing: Minimal assistance   Lower Body Bathing: Minimal assistance   Upper Body Dressing : Minimal assistance   Lower Body Dressing: Moderate assistance   Toilet Transfer: Minimal assistance;Ambulation   Toileting- Clothing Manipulation and Hygiene: Minimal assistance       Functional mobility during ADLs: Minimal assistance       Vision Baseline Vision/History: 1 Wears glasses Vision Assessment?: Yes Eye Alignment: Within Functional  Limits Ocular Range of Motion: Within Functional Limits Alignment/Gaze Preference: Within Defined Limits Tracking/Visual Pursuits: Able to track stimulus in all quads without  difficulty Saccades: Decreased speed of saccadic movement Visual Fields:  (appears to be intact; will continue ot assess) Additional Comments: no over/undershooting noted     Perception Perception Comments: appears intact   Praxis Praxis Praxis tested?: Deficits Deficits: Perseveration;Organization;Ideomotor Praxis-Other Comments: brushing teeth without toothpaste - did not self correct error; required step by step cues    Pertinent Vitals/Pain Pain Assessment Pain Assessment: No/denies pain     Hand Dominance Right   Extremity/Trunk Assessment Upper Extremity Assessment Upper Extremity Assessment: RUE deficits/detail RUE Deficits / Details: using funcitonally howevernote increased processing time with funcitonal tasks, i.e. brushing teeth - very slow movement RUE Coordination: decreased fine motor   Lower Extremity Assessment Lower Extremity Assessment: Defer to PT evaluation   Cervical / Trunk Assessment Cervical / Trunk Assessment: Normal   Communication Communication Communication: Expressive difficulties; receptive??   Cognition Arousal/Alertness: Awake/alert Behavior During Therapy: Flat affect Overall Cognitive Status: Difficult to assess                         Following Commands: Follows one step commands with increased time, Follows one step commands inconsistently Safety/Judgement: Decreased awareness of safety, Decreased awareness of deficits Awareness: Emergent Problem Solving: Slow processing, Requires verbal cues, Requires tactile cues General Comments: able to use objects appropriately; able to identify correct amounts of coins with written response however was not able to verbally identify/name them; note perseveration; pt does not appear to note his errors     General Comments  VSS however note increased WOB    Exercises Exercises: Other exercises Other Exercises Other Exercises: fine motor/coordination Other Exercises: in-hand   manipulaiton tasks   Shoulder Instructions      Home Living Family/patient expects to be discharged to:: Private residence Living Arrangements: Spouse/significant other Available Help at Discharge: Family;Available 24 hours/day Type of Home: House Home Access: Stairs to enter [employer] of Steps: 3 Entrance Stairs-Rails: Left Home Layout: One level     Bathroom Shower/Tub: Teacher, early years/pre: Handicapped height Bathroom Accessibility: No   Home Equipment: Conservation officer, nature (2 wheels);BSC/3in1          Prior Functioning/Environment Prior Level of Function : Independent/Modified Independent                        OT Problem List: Decreased strength;Decreased activity tolerance;Impaired balance (sitting and/or standing);Decreased coordination;Decreased cognition;Decreased safety awareness;Impaired UE functional use      OT Treatment/Interventions: Self-care/ADL training;Therapeutic exercise;Neuromuscular education;DME and/or AE instruction;Therapeutic activities;Cognitive remediation/compensation;Patient/family education;Balance training    OT Goals(Current goals can be found in the care plan section) Acute Rehab OT Goals Patient Stated Goal: per famiy to go to rehab OT Goal Formulation: With patient Time For Goal Achievement: 07/07/22 Potential to Achieve Goals: Good  OT Frequency: Min 2X/week    Co-evaluation              AM-PAC OT "6 Clicks" Daily Activity     Outcome Measure Help from another person eating meals?: A Little Help from another person taking care of personal grooming?: A Little Help from another person toileting, which includes using toliet, bedpan, or urinal?: A Little Help from another person bathing (including washing, rinsing, drying)?: A Little Help from another person to put on and taking off regular upper body clothing?: A Little Help  from another person to put on and taking off regular lower body clothing?:  A Lot 6 Click Score: 17   End of Session Equipment Utilized During Treatment: Gait belt Nurse Communication: Mobility status  Activity Tolerance: Patient tolerated treatment well Patient left: in chair;with call bell/phone within reach;with chair alarm set;with family/visitor present  OT Visit Diagnosis: Unsteadiness on feet (R26.81);Apraxia (R48.2);Other symptoms and signs involving cognitive function;Cognitive communication deficit (R41.841) Symptoms and signs involving cognitive functions: Cerebral infarction                Time: 5284-1324 OT Time Calculation (min): 27 min Charges:  OT General Charges $OT Visit: 1 Visit OT Evaluation $OT Eval Moderate Complexity: 1 Mod OT Treatments $Self Care/Home Management : 8-22 mins  Maurie Boettcher, OT/L   Acute OT Clinical Specialist Acute Rehabilitation Services Pager [phone] Office [phone]   [facility] 06/23/2022, 2:51 PM

## 2022-06-23 NOTE — Progress Notes (Addendum)
  Transition of Care [facility]) Screening Note   Patient Details  Name: Richard Flynn Date of Birth: 07/20/1955   Transition of Care [facility]) CM/SW Contact:    Pollie Friar, RN Phone Number: 06/23/2022, 3:11 PM   Pt is from home with spouse. CIR to start insurance for rehab admit. Transition of Care Department [facility]) has reviewed patient. We will continue to monitor patient advancement through interdisciplinary progression rounds. If new patient transition needs arise, please place a TOC consult.

## 2022-06-23 NOTE — Progress Notes (Addendum)
Inpatient Rehab Coordinator Note:  I met with patient and wife, [name], at bedside to discuss CIR recommendations and goals/expectations of CIR stay.  We reviewed 3 hrs/day of therapy, physician follow up, and average length of stay 2 weeks (dependent upon progress) with goals of supervision. Wife reports she is at home 24/7 and can assist patient with additional help of family members nearby. Needs OT evaluation to open insurance. Once this is done, will submit to insurance for prior approval and potential admission to CIR.  Rehab Admissons Coordinator [place], [place], [place] [phone]

## 2022-06-23 NOTE — Progress Notes (Addendum)
STROKE TEAM PROGRESS NOTE   INTERVAL HISTORY  Family at the bedside. Patient is sitting in the chair in NAD. Follows commands, still with significant expressive aphasia. No facial droop.  K improved to 3.6 after replacement yesterday  WBC 12.0, afebrile will monitor Recommendations for CIR  Vitals:   06/23/22 0319 06/23/22 0337 06/23/22 0809 06/23/22 1210  BP: 137/78  120/64 121/62  Pulse: 89  77 73  Resp: 19  (!) 23 (!) 23  Temp: 98.4 F (36.9 C)  98.9 F (37.2 C) 98.1 F (36.7 C)  TempSrc: Oral  Oral Oral  SpO2: 96%  94% 97%  Weight:  81.4 kg    Height: '5\' 5"'$  (1.651 m)      CBC:  Recent Labs  Lab 06/20/22 1548 06/21/22 0452 06/22/22 0342 06/23/22 0858  WBC 10.2 10.1 12.0* 12.0*  NEUTROABS 7.8* 7.5  --   --   HGB 14.1 12.3* 10.9* 10.7*  HCT 41.4 37.2* 32.9* 31.5*  MCV 91.4 92.5 90.6 90.0  PLT 162 130* 104* 108*    Basic Metabolic Panel:  Recent Labs  Lab 06/22/22 0342 06/23/22 0858  NA 140 140  K 3.3* 3.6  CL 110 111  CO2 18* 18*  GLUCOSE 88 132*  BUN 13 20  CREATININE 1.22 1.36*  CALCIUM 7.9* 8.3*    Lipid Panel:  Recent Labs  Lab 06/21/22 0452 06/22/22 0342  CHOL 111  --   TRIG 553* 134  HDL 14*  --   CHOLHDL 7.9  --   VLDL UNABLE TO CALCULATE IF TRIGLYCERIDE OVER 400 mg/dL  --   LDLCALC UNABLE TO CALCULATE IF TRIGLYCERIDE OVER 400 mg/dL  --     HgbA1c: No results for input(s): "HGBA1C" in the last 168 hours. Urine Drug Screen:  Recent Labs  Lab 06/21/22 0710  LABOPIA NONE DETECTED  COCAINSCRNUR NONE DETECTED  LABBENZ NONE DETECTED  AMPHETMU NONE DETECTED  THCU NONE DETECTED  LABBARB NONE DETECTED     Alcohol Level  Recent Labs  Lab 06/20/22 1548  ETH <10     IMAGING past 24 hours No results found.  PHYSICAL EXAM General: Alert, well-developed, well-nourished patient in no acute distress Respiratory: Regular, unlabored respirations   NEURO:  Mental Status: Responds to name and is able to follow simple  commands Speech/Language: speech is without dysarthria but with significant expressive aphasia.  Naming and repetition are impaired, and patient is only able to speak in single words   Cranial Nerves:  II: PERRL.  III, IV, VI: EOMI.  V: Sensation is intact to light touch and symmetrical to face.  VII: Smile is symmetrical.   VIII: hearing intact to voice. IX, X:Phonation is normal.  FA:OZHYQMVH shrug 5/5. XII: tongue is midline without fasciculations. Motor: 5/5 strength to all muscle groups tested.  Tone: is normal and bulk is normal Sensation- Intact to light touch bilaterally.  Gait- deferred   ASSESSMENT/PLAN [name] is a 66 y.o. male with history of CAD, CHF, cardiomyopathy, pacemaker, CKD stage III, diabetes, hypertension, hyperlipidemia and PAD presenting with a sudden collapse with aphasia and right facial droop.  He was noted to be initially hypoglycemic by EMS, but when dextrose was given and blood glucose rose, symptoms remained.  Head CT on arrival to the ED was negative for acute ICH, so TNK was administered.  Patient was then found to have left MCA M1 occlusion on CTA head and was taken to IR for mechanical thrombectomy.  TICI 3 flow was  achieved.  Patient has been hemodynamically stable overnight but continues to have some expressive aphasia.  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  Hyperdense left MCA  CTA head & neck LVO at left M1 segment, high-grade right V1 stenosis Post IR CT no hemorrhage Post IR CTA head and neck left M1 patent  Follow-up CT 12/24 acute infarcts in left MCA and ACA territory MRI unable to perform due to incompatible pacemaker 2D Echo EF 25 to 30%  pacemaker not able to be interrogation to rule out afib, only v lead and will not be able to detect A fib  LDL 48 HgbA1c pending UDS neg VTE  prophylaxis -SCDs aspirin 81 mg daily prior to admission, now on ASA and plavix DAPT.  Therapy recommendations: CIR Disposition: Pending  Cardiomyopathy CHF EF 25 to 30% Home meds including Delene Loll, Coreg May related to current stroke Will discuss with EP Dr. Caryl Comes in am  Hypertension Home meds: Carvedilol 12.5 mg twice daily Stable on the low end Long-term BP goal normotensive  Hyperlipidemia Home meds: Atorvastatin 80 mg daily, resumed in hospital LDL 48, goal < 70 Resume home Lipitor 80 Continue statin at discharge  Diabetes type II Controlled Home meds: Jardiance 10 mg daily, glipizide 5 mg twice daily, Lantus 28 units daily, metformin 1000 mg twice daily Hypoglycemia on presentation received a D10 HgbA1c pending, goal < 7.0 CBGs SSI Close PCP follow-up as outpatient  Dysphagia Pass swallow Now on dys 3 and thin liquid Speech on board  Other Stroke Risk Factors Advanced Age >/= 69  Former cigarette smoker CAD PAD  Other Active Problems Pacemaker CKD 3a,  creatinine 1.33-1.22-1.33-1.36 Leukocytosis 12.0. Afebrile will monitor  Anemia Hgb 10.7 Monitor  Hypokalemia K 3.6  Hospital day # [place] DNP, ACNPC-AG  [facility] 06/23/2022 2:41 PM  ATTENDING NOTE: I reviewed above note and agree with the assessment and plan. Pt was seen and examined.   No family at the bedside. Pt reclining in bed, neuro stable, no acute event overnight. He was able to say "good" "hospital", able to name 2/4 and able to repeat 3-word sentence in dysarthric voice, not able to repeat 5-word sentences. Moving all extremities. Pacemaker was V-leads only, not able to check afib. Will discuss with EP Dr. Caryl Comes in am regarding further cardiac monitoring vs. Anticoagulation. Pending CIR.  For detailed assessment and plan, please refer to above/below as I have made changes wherever appropriate.   Rosalin Hawking, MD PhD Stroke Neurology 06/23/2022 10:19 PM    To  contact Stroke Continuity provider, please refer to [email or web address] After hours, contact General Neurology

## 2022-06-23 NOTE — Plan of Care (Signed)

## 2022-06-23 NOTE — Progress Notes (Signed)
Physical Therapy Treatment Patient Details Name: Richard Flynn MRN: [id number] DOB: 07-30-1955 Today's Date: 06/23/2022   History of Present Illness Richard Flynn is a 66 y.o. male who presented to the ED on 06/20/2022 via EMS for evaluation of acute onset of right-sided weakness and aphasia. Pt found to have emergent L M1 occlusion and underwent a thrombectomy. Also received TNK.  PMH: coronary artery disease, chronic systolic CHF, cardiomyopathy, CKD stage III, type 2 diabetes mellitus, hyperlipidemia, hypertension, and PAD    PT Comments    Pt greeted up in chair on arrival and agreeable to session with continued progress towards goals, however pt continues to be limited by impaired balance/postural reactions, global aphasia and general weakness. Pt demonstrating improved gait this date with ambulation without AD at min guard level for safety, however pt with noted general instability throughout. Pt following ~50% verbal only commands correctly and following all demonstrative cues correctly. Plan to continue to progress improving balance and activity tolerance in future sessions. Current plan remains appropriate to address deficits and maximize functional independence and decrease caregiver burden. Pt continues to benefit from skilled PT services to progress toward functional mobility goals.     Recommendations for follow up therapy are one component of a multi-disciplinary discharge planning process, led by the attending physician.  Recommendations may be updated based on patient status, additional functional criteria and insurance authorization.  Follow Up Recommendations  Acute inpatient rehab (3hours/day) (will continue to assess pending progress)     Assistance Recommended at Discharge Frequent or constant Supervision/Assistance  Patient can return home with the following A little help with walking and/or transfers;A little help with bathing/dressing/bathroom;Direct supervision/assist for  medications management;Direct supervision/assist for financial management;Assist for transportation;Help with stairs or ramp for entrance   Equipment Recommendations   (TBD)    Recommendations for Other Services       Precautions / Restrictions Precautions Precautions: Fall Precaution Comments: aphasic Restrictions Weight Bearing Restrictions: No     Mobility  Bed Mobility Overal bed mobility: Needs Assistance             General bed mobility comments: pt OOB pre and post session    Transfers Overall transfer level: Needs assistance Equipment used: None Transfers: Sit to/from Stand, Bed to chair/wheelchair/BSC Sit to Stand: Min guard           General transfer comment: min guardc for safety    Ambulation/Gait Ambulation/Gait assistance: Min guard Gait Distance (Feet): 300 Feet Assistive device: None Gait Pattern/deviations: Step-through pattern, Decreased stride length, Wide base of support Gait velocity: dec     General Gait Details: recipriocal gait pattern without AD, no LOB, able to follow ~50% of verbal cues for direction correctly.   Stairs Stairs: Yes Stairs assistance: Min guard Stair Management: Two rails, Alternating pattern, Forwards Number of Stairs: 4 General stair comments: no LOB   Wheelchair Mobility    Modified Rankin (Stroke Patients Only) Modified Rankin (Stroke Patients Only) Pre-Morbid Rankin Score: No symptoms Modified Rankin: Moderately severe disability     Balance Overall balance assessment: Needs assistance Sitting-balance support: Feet supported, No upper extremity supported Sitting balance-Leahy Scale: Fair     Standing balance support: During functional activity Standing balance-Leahy Scale: Fair                              Cognition Arousal/Alertness: Awake/alert Behavior During Therapy: Flat affect Overall Cognitive Status: Impaired/Different from baseline Area of Impairment:  Orientation,  Following commands, Safety/judgement, Awareness, Problem solving                       Following Commands: Follows one step commands with increased time Safety/Judgement: Decreased awareness of safety, Decreased awareness of deficits   Problem Solving: Slow processing, Requires verbal cues, Requires tactile cues          Exercises      General Comments General comments (skin integrity, edema, etc.): VSS on RA      Pertinent Vitals/Pain      Home Living   Living Arrangements: Spouse/significant other Available Help at Discharge: Family;Available 24 hours/day Type of Home: House Home Access: Stairs to enter Entrance Stairs-Rails: Left Entrance Stairs-Number of Steps: 3   Home Layout: One level        Prior Function            PT Goals (current goals can now be found in the care plan section) Acute Rehab PT Goals PT Goal Formulation: With patient Time For Goal Achievement: 07/05/22    Frequency    Min 4X/week      PT Plan      Co-evaluation              AM-PAC PT "6 Clicks" Mobility   Outcome Measure  Help needed turning from your back to your side while in a flat bed without using bedrails?: A Little Help needed moving from lying on your back to sitting on the side of a flat bed without using bedrails?: A Little Help needed moving to and from a bed to a chair (including a wheelchair)?: A Little Help needed standing up from a chair using your arms (e.g., wheelchair or bedside chair)?: A Little Help needed to walk in hospital room?: A Little Help needed climbing 3-5 steps with a railing? : A Lot 6 Click Score: 17    End of Session Equipment Utilized During Treatment: Gait belt Activity Tolerance: Patient tolerated treatment well Patient left: in chair;with call bell/phone within reach;with family/visitor present Nurse Communication: Mobility status PT Visit Diagnosis: Unsteadiness on feet (R26.81);Muscle weakness (generalized)  (M62.81);Difficulty in walking, not elsewhere classified (R26.2)     Time: 7078-6754 PT Time Calculation (min) (ACUTE ONLY): 19 min  Charges:  $Gait Training: 8-22 mins                     Jhamari Markowicz R. PTA Acute Rehabilitation Services Office: [place] 06/23/2022, 12:11 PM

## 2022-06-23 NOTE — Progress Notes (Signed)
Inpatient Rehab Admissions Coordinator:  ? ?Per therapy recommendations,  patient was screened for CIR candidacy by Joeann Steppe, MS, CCC-SLP. At this time, Pt. Appears to be a a potential candidate for CIR. I will place   order for rehab consult per protocol for full assessment. Please contact me any with questions. ? ?Kartel Wolbert, MS, CCC-SLP ?Rehab Admissions Coordinator  ?[phone] (celll) ?[phone] (office) ? ?

## 2022-06-24 ENCOUNTER — Telehealth: Payer: Self-pay | Admitting: Cardiology

## 2022-06-24 DIAGNOSIS — I63512 Cerebral infarction due to unspecified occlusion or stenosis of left middle cerebral artery: Secondary | ICD-10-CM | POA: Diagnosis not present

## 2022-06-24 LAB — CBC
HCT: 32.9 % — ABNORMAL LOW (ref 39.0–52.0)
Hemoglobin: 11.3 g/dL — ABNORMAL LOW (ref 13.0–17.0)
MCH: 30.9 pg (ref 26.0–34.0)
MCHC: 34.3 g/dL (ref 30.0–36.0)
MCV: 89.9 fL (ref 80.0–100.0)
Platelets: 142 10*3/uL — ABNORMAL LOW (ref 150–400)
RBC: 3.66 MIL/uL — ABNORMAL LOW (ref 4.22–5.81)
RDW: 15.1 % (ref 11.5–15.5)
WBC: 13.7 10*3/uL — ABNORMAL HIGH (ref 4.0–10.5)
nRBC: 0 % (ref 0.0–0.2)

## 2022-06-24 LAB — BASIC METABOLIC PANEL
Anion gap: 9 (ref 5–15)
BUN: 25 mg/dL — ABNORMAL HIGH (ref 8–23)
CO2: 19 mmol/L — ABNORMAL LOW (ref 22–32)
Calcium: 8.4 mg/dL — ABNORMAL LOW (ref 8.9–10.3)
Chloride: 109 mmol/L (ref 98–111)
Creatinine, Ser: 1.45 mg/dL — ABNORMAL HIGH (ref 0.61–1.24)
GFR, Estimated: 52 mL/min — ABNORMAL LOW (ref >60)
Glucose, Bld: 217 mg/dL — ABNORMAL HIGH (ref 70–99)
Potassium: 3.4 mmol/L — ABNORMAL LOW (ref 3.5–5.1)
Sodium: 137 mmol/L (ref 135–145)

## 2022-06-24 LAB — GLUCOSE, CAPILLARY
Glucose-Capillary: 169 mg/dL — ABNORMAL HIGH (ref 70–99)
Glucose-Capillary: 150 mg/dL — ABNORMAL HIGH (ref 70–99)
Glucose-Capillary: 247 mg/dL — ABNORMAL HIGH (ref 70–99)
Glucose-Capillary: 251 mg/dL — ABNORMAL HIGH (ref 70–99)

## 2022-06-24 MED ORDER — POTASSIUM CHLORIDE 20 MEQ PO PACK
40.0000 meq | PACK | Freq: Once | ORAL | Status: AC
  Administered 2022-06-24: 40 meq via ORAL
  Filled 2022-06-24: qty 2

## 2022-06-24 NOTE — Telephone Encounter (Signed)
Patient's wife is requesting a call back to review medication list.

## 2022-06-24 NOTE — Progress Notes (Addendum)
STROKE TEAM PROGRESS NOTE   INTERVAL HISTORY  Family at the bedside. Patient is sitting in the chair in NAD. Follows commands, still with significant expressive aphasia.per daughter his aphasia is better. No facial droop.  K: 3.3 today down from 3.6  yesterday BUN and Creatinine slightly elevated today.  WBC 13.7 increased from 12.0 yesterday. Remains afebrile  Recommendations for CIR  Vitals:   06/23/22 0337 06/24/22 0325 06/24/22 0838 06/24/22 1203  BP:  122/84 134/85 121/81  Pulse:  81 83 75  Resp:  (!) 23 20 (!) 22  Temp:  98.5 F (36.9 C) 98.7 F (37.1 C) 98.4 F (36.9 C)  TempSrc:  Oral Oral Oral  SpO2:  93% 100% 98%  Weight: 81.4 kg     Height:       CBC:  Recent Labs  Lab 06/20/22 1548 06/21/22 0452 06/22/22 0342 06/23/22 0858 06/24/22 0055  WBC 10.2 10.1   < > 12.0* 13.7*  NEUTROABS 7.8* 7.5  --   --   --   HGB 14.1 12.3*   < > 10.7* 11.3*  HCT 41.4 37.2*   < > 31.5* 32.9*  MCV 91.4 92.5   < > 90.0 89.9  PLT 162 130*   < > 108* 142*   < > = values in this interval not displayed.   Basic Metabolic Panel:  Recent Labs  Lab 06/23/22 0858 06/24/22 0055  NA 140 137  K 3.6 3.4*  CL 111 109  CO2 18* 19*  GLUCOSE 132* 217*  BUN 20 25*  CREATININE 1.36* 1.45*  CALCIUM 8.3* 8.4*   Lipid Panel:  Recent Labs  Lab 06/21/22 0452 06/22/22 0342  CHOL 111  --   TRIG 553* 134  HDL 14*  --   CHOLHDL 7.9  --   VLDL UNABLE TO CALCULATE IF TRIGLYCERIDE OVER 400 mg/dL  --   LDLCALC UNABLE TO CALCULATE IF TRIGLYCERIDE OVER 400 mg/dL  --    HgbA1c: No results for input(s): "HGBA1C" in the last 168 hours. Urine Drug Screen:  Recent Labs  Lab 06/21/22 0710  LABOPIA NONE DETECTED  COCAINSCRNUR NONE DETECTED  LABBENZ NONE DETECTED  AMPHETMU NONE DETECTED  THCU NONE DETECTED  LABBARB NONE DETECTED    Alcohol Level  Recent Labs  Lab 06/20/22 1548  ETH <10    IMAGING past 24 hours No results found.  PHYSICAL EXAM General: Alert, well-developed,  well-nourished patient in no acute distress Respiratory: Regular, unlabored respirations   NEURO:  Mental Status: Responds to name and is able to follow simple commands Speech/Language: speech is without dysarthria but with significant expressive aphasia.  Naming and repetition are impaired, and patient is only able to speak in single words   Cranial Nerves:  II: PERRL.  III, IV, VI: EOMI.  V: Sensation is intact to light touch and symmetrical to face.  VII: Smile is symmetrical.   VIII: hearing intact to voice. IX, X:Phonation is normal.  HK:VQQVZDGL shrug 5/5. XII: tongue is midline without fasciculations. Motor: 5/5 strength to all muscle groups tested.  Tone: is normal and bulk is normal Sensation- Intact to light touch bilaterally.  Gait- deferred   ASSESSMENT/PLAN [name] is a 66 y.o. male with history of CAD, CHF, cardiomyopathy, pacemaker, CKD stage III, diabetes, hypertension, hyperlipidemia and PAD presenting with a sudden collapse with aphasia and right facial droop.  He was noted to be initially hypoglycemic by EMS, but when dextrose was given and blood glucose rose, symptoms remained.  Head CT on arrival to the ED was negative for acute ICH, so TNK was administered.  Patient was then found to have left MCA M1 occlusion on CTA head and was taken to IR for mechanical thrombectomy.  TICI 3 flow was achieved.  Patient has been hemodynamically stable overnight but continues to have some expressive aphasia.  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  Hyperdense left MCA  CTA head & neck LVO at left M1 segment, high-grade right V1 stenosis Post IR CT no hemorrhage Post IR CTA head and neck left M1 patent  Follow-up CT 12/24 acute infarcts in left MCA and ACA territory MRI unable to perform due to incompatible pacemaker 2D Echo EF 25  to 30%  pacemaker not able to be interrogation to rule out afib, only v lead and will not be able to detect A fib  LDL 48 HgbA1c pending UDS neg VTE prophylaxis -SCDs aspirin 81 mg daily prior to admission, now on ASA and plavix DAPT.  Therapy recommendations: CIR Disposition: Pending  Cardiomyopathy CHF EF 25 to 30% Home meds including Entresto, Coreg May related to current stroke Cardiology to consult - loop vs. AC  Hypertension Home meds: Carvedilol 12.5 mg twice daily Stable on the low end Long-term BP goal normotensive  Hyperlipidemia Home meds: Atorvastatin 80 mg daily, resumed in hospital LDL 48, goal < 70 Resume home Lipitor 80 Continue statin at discharge  Diabetes type II Controlled Home meds: Jardiance 10 mg daily, glipizide 5 mg twice daily, Lantus 28 units daily, metformin 1000 mg twice daily Hypoglycemia on presentation received a D10 HgbA1c pending, goal < 7.0 CBGs SSI Close PCP follow-up as outpatient  Dysphagia Pass swallow Now on dys 3 and thin liquid Speech on board  Other Stroke Risk Factors Advanced Age >/= 33  Former cigarette smoker CAD PAD  Other Active Problems Pacemaker CKD 3a,  creatinine 1.45 Leukocytosis 13.7. Afebrile will monitor  Anemia Hgb 10.7 Monitor  Hypokalemia K 3.3  Hospital day # [place], MSN, NP-C [facility] See AMION or use Epic Chat  ATTENDING NOTE: I reviewed above note and agree with the assessment and plan. Pt was seen and examined.   Wife at the bedside. Pt sitting in chair taking a nap. Pt speech continues to improve, able to write sentences to wife, although with paraphasic errors but understandable. Moving all extremities well. Pending cardiology input regarding loop recorder vs. AC. Pending CIR.   For detailed assessment and plan, please refer to above/below as I have made changes wherever appropriate.   Rosalin Hawking, MD PhD Stroke Neurology 06/24/2022 11:26 PM     To  contact Stroke Continuity provider, please refer to [email or web address] After hours, contact General Neurology

## 2022-06-24 NOTE — Care Management Important Message (Signed)
Important Message  Patient Details  Name: Richard Flynn MRN: [id number] Date of Birth: May 11, 1956   Medicare Important Message Given:  Yes     Orbie Pyo 06/24/2022, 3:33 PM

## 2022-06-24 NOTE — Plan of Care (Signed)

## 2022-06-24 NOTE — Plan of Care (Signed)
  Problem: Education: Goal: Knowledge of disease or condition will improve Outcome: Progressing Goal: Knowledge of secondary prevention will improve (MUST DOCUMENT ALL) Outcome: Progressing Goal: Knowledge of patient specific risk factors will improve Richard Flynn N/A or DELETE if not current risk factor) Outcome: Progressing   Problem: Ischemic Stroke/TIA Tissue Perfusion: Goal: Complications of ischemic stroke/TIA will be minimized Outcome: Progressing   Problem: Coping: Goal: Will verbalize positive feelings about self Outcome: Progressing Goal: Will identify appropriate support needs Outcome: Progressing   Problem: Health Behavior/Discharge Planning: Goal: Ability to manage health-related needs will improve Outcome: Progressing Goal: Goals will be collaboratively established with patient/family Outcome: Progressing   Problem: Self-Care: Goal: Ability to participate in self-care as condition permits will improve Outcome: Progressing Goal: Verbalization of feelings and concerns over difficulty with self-care will improve Outcome: Progressing Goal: Ability to communicate needs accurately will improve Outcome: Progressing   Problem: Nutrition: Goal: Risk of aspiration will decrease Outcome: Progressing Goal: Dietary intake will improve Outcome: Progressing   Problem: Education: Goal: Ability to describe self-care measures that may prevent or decrease complications (Diabetes Survival Skills Education) will improve Outcome: Progressing Goal: Individualized Educational Video(s) Outcome: Progressing   Problem: Coping: Goal: Ability to adjust to condition or change in health will improve Outcome: Progressing   Problem: Fluid Volume: Goal: Ability to maintain a balanced intake and output will improve Outcome: Progressing   Problem: Health Behavior/Discharge Planning: Goal: Ability to identify and utilize available resources and services will improve Outcome:  Progressing Goal: Ability to manage health-related needs will improve Outcome: Progressing   Problem: Metabolic: Goal: Ability to maintain appropriate glucose levels will improve Outcome: Progressing   Problem: Nutritional: Goal: Maintenance of adequate nutrition will improve Outcome: Progressing Goal: Progress toward achieving an optimal weight will improve Outcome: Progressing   Problem: Skin Integrity: Goal: Risk for impaired skin integrity will decrease Outcome: Progressing   Problem: Education: Goal: Knowledge of General Education information will improve Description: Including pain rating scale, medication(s)/side effects and non-pharmacologic comfort measures Outcome: Progressing   Problem: Health Behavior/Discharge Planning: Goal: Ability to manage health-related needs will improve Outcome: Progressing   Problem: Clinical Measurements: Goal: Ability to maintain clinical measurements within normal limits will improve Outcome: Progressing Goal: Will remain free from infection Outcome: Progressing Goal: Diagnostic test results will improve Outcome: Progressing Goal: Respiratory complications will improve Outcome: Progressing Goal: Cardiovascular complication will be avoided Outcome: Progressing   Problem: Activity: Goal: Risk for activity intolerance will decrease Outcome: Progressing   Problem: Nutrition: Goal: Adequate nutrition will be maintained Outcome: Progressing   Problem: Coping: Goal: Level of anxiety will decrease Outcome: Progressing   Problem: Elimination: Goal: Will not experience complications related to bowel motility Outcome: Progressing Goal: Will not experience complications related to urinary retention Outcome: Progressing   Problem: Pain Managment: Goal: General experience of comfort will improve Outcome: Progressing   Problem: Safety: Goal: Ability to remain free from injury will improve Outcome: Progressing   Problem: Skin  Integrity: Goal: Risk for impaired skin integrity will decrease Outcome: Progressing

## 2022-06-24 NOTE — Progress Notes (Signed)
Inpatient Rehab Admissions Coordinator:   Following for my colleague Amanda Cockayne.  Insurance auth request pending with [facility].  Will follow.    Shann Medal, PT, DPT Admissions Coordinator [phone] 06/24/22  10:20 AM

## 2022-06-24 NOTE — Telephone Encounter (Signed)
Returned call to wife (ok per DPR)-states patient in hospital for stroke.     She states she noticed some differences in medication from [facility] vs [facility] and wanted to update/confirm:  Lipitor 80 Carvedilol 12.5 mg BID Needs refill ntg-expired  (updated rx sent). Insulin 28 units  Advised med list may change but updated at current.  Will clarify and update at follow up.     Wife would like message sent to Dr. Stanford Breed to make aware of admission.   States patient is doing well, only has difficulty with speech but hopefully will be going to [employer] approval.

## 2022-06-25 ENCOUNTER — Inpatient Hospital Stay (HOSPITAL_COMMUNITY): Payer: HMO

## 2022-06-25 ENCOUNTER — Encounter (HOSPITAL_COMMUNITY): Payer: Self-pay | Admitting: Physical Medicine & Rehabilitation

## 2022-06-25 ENCOUNTER — Inpatient Hospital Stay (HOSPITAL_COMMUNITY)
Admission: RE | Admit: 2022-06-25 | Discharge: 2022-07-01 | DRG: 057 | Disposition: A | Discharge status: Home / Self Care | Payer: HMO | Source: Intra-hospital | Attending: Physical Medicine & Rehabilitation | Admitting: Physical Medicine & Rehabilitation

## 2022-06-25 ENCOUNTER — Encounter (HOSPITAL_COMMUNITY)
Admission: EM | Disposition: A | Discharge status: Rehabilitation Facility | Payer: Self-pay | Source: Home / Self Care | Attending: Neurology

## 2022-06-25 DIAGNOSIS — Z7982 Long term (current) use of aspirin: Secondary | ICD-10-CM

## 2022-06-25 DIAGNOSIS — I7789 Other specified disorders of arteries and arterioles: Secondary | ICD-10-CM | POA: Diagnosis present

## 2022-06-25 DIAGNOSIS — E1122 Type 2 diabetes mellitus with diabetic chronic kidney disease: Secondary | ICD-10-CM | POA: Diagnosis present

## 2022-06-25 DIAGNOSIS — D631 Anemia in chronic kidney disease: Secondary | ICD-10-CM | POA: Diagnosis present

## 2022-06-25 DIAGNOSIS — F17290 Nicotine dependence, other tobacco product, uncomplicated: Secondary | ICD-10-CM | POA: Diagnosis present

## 2022-06-25 DIAGNOSIS — N1831 Chronic kidney disease, stage 3a: Secondary | ICD-10-CM | POA: Diagnosis present

## 2022-06-25 DIAGNOSIS — I69391 Dysphagia following cerebral infarction: Secondary | ICD-10-CM | POA: Diagnosis not present

## 2022-06-25 DIAGNOSIS — I739 Peripheral vascular disease, unspecified: Secondary | ICD-10-CM | POA: Insufficient documentation

## 2022-06-25 DIAGNOSIS — Z950 Presence of cardiac pacemaker: Secondary | ICD-10-CM

## 2022-06-25 DIAGNOSIS — Z7984 Long term (current) use of oral hypoglycemic drugs: Secondary | ICD-10-CM | POA: Diagnosis not present

## 2022-06-25 DIAGNOSIS — Z8249 Family history of ischemic heart disease and other diseases of the circulatory system: Secondary | ICD-10-CM | POA: Diagnosis not present

## 2022-06-25 DIAGNOSIS — N183 Chronic kidney disease, stage 3 unspecified: Secondary | ICD-10-CM | POA: Insufficient documentation

## 2022-06-25 DIAGNOSIS — E876 Hypokalemia: Secondary | ICD-10-CM | POA: Insufficient documentation

## 2022-06-25 DIAGNOSIS — Z741 Need for assistance with personal care: Secondary | ICD-10-CM | POA: Diagnosis present

## 2022-06-25 DIAGNOSIS — I255 Ischemic cardiomyopathy: Secondary | ICD-10-CM | POA: Insufficient documentation

## 2022-06-25 DIAGNOSIS — I130 Hypertensive heart and chronic kidney disease with heart failure and stage 1 through stage 4 chronic kidney disease, or unspecified chronic kidney disease: Secondary | ICD-10-CM | POA: Diagnosis present

## 2022-06-25 DIAGNOSIS — I69351 Hemiplegia and hemiparesis following cerebral infarction affecting right dominant side: Principal | ICD-10-CM

## 2022-06-25 DIAGNOSIS — I2510 Atherosclerotic heart disease of native coronary artery without angina pectoris: Secondary | ICD-10-CM | POA: Diagnosis present

## 2022-06-25 DIAGNOSIS — I5022 Chronic systolic (congestive) heart failure: Secondary | ICD-10-CM | POA: Diagnosis present

## 2022-06-25 DIAGNOSIS — I639 Cerebral infarction, unspecified: Secondary | ICD-10-CM | POA: Diagnosis present

## 2022-06-25 DIAGNOSIS — G4700 Insomnia, unspecified: Secondary | ICD-10-CM | POA: Diagnosis present

## 2022-06-25 DIAGNOSIS — R053 Chronic cough: Secondary | ICD-10-CM | POA: Diagnosis present

## 2022-06-25 DIAGNOSIS — E119 Type 2 diabetes mellitus without complications: Secondary | ICD-10-CM

## 2022-06-25 DIAGNOSIS — E785 Hyperlipidemia, unspecified: Secondary | ICD-10-CM | POA: Diagnosis present

## 2022-06-25 DIAGNOSIS — Z79899 Other long term (current) drug therapy: Secondary | ICD-10-CM

## 2022-06-25 DIAGNOSIS — I69320 Aphasia following cerebral infarction: Secondary | ICD-10-CM

## 2022-06-25 DIAGNOSIS — I10 Essential (primary) hypertension: Secondary | ICD-10-CM | POA: Insufficient documentation

## 2022-06-25 DIAGNOSIS — Z9049 Acquired absence of other specified parts of digestive tract: Secondary | ICD-10-CM

## 2022-06-25 DIAGNOSIS — F17200 Nicotine dependence, unspecified, uncomplicated: Secondary | ICD-10-CM | POA: Insufficient documentation

## 2022-06-25 DIAGNOSIS — E1151 Type 2 diabetes mellitus with diabetic peripheral angiopathy without gangrene: Secondary | ICD-10-CM | POA: Diagnosis present

## 2022-06-25 DIAGNOSIS — I63512 Cerebral infarction due to unspecified occlusion or stenosis of left middle cerebral artery: Secondary | ICD-10-CM | POA: Diagnosis not present

## 2022-06-25 DIAGNOSIS — E7800 Pure hypercholesterolemia, unspecified: Secondary | ICD-10-CM

## 2022-06-25 HISTORY — PX: LOOP RECORDER INSERTION: EP1214

## 2022-06-25 LAB — CBC
HCT: 31 % — ABNORMAL LOW (ref 39.0–52.0)
Hemoglobin: 10.7 g/dL — ABNORMAL LOW (ref 13.0–17.0)
MCH: 30.9 pg (ref 26.0–34.0)
MCHC: 34.5 g/dL (ref 30.0–36.0)
MCV: 89.6 fL (ref 80.0–100.0)
Platelets: 148 10*3/uL — ABNORMAL LOW (ref 150–400)
RBC: 3.46 MIL/uL — ABNORMAL LOW (ref 4.22–5.81)
RDW: 15.1 % (ref 11.5–15.5)
WBC: 9.9 10*3/uL (ref 4.0–10.5)
nRBC: 0 % (ref 0.0–0.2)

## 2022-06-25 LAB — BASIC METABOLIC PANEL
Anion gap: 10 (ref 5–15)
BUN: 29 mg/dL — ABNORMAL HIGH (ref 8–23)
CO2: 19 mmol/L — ABNORMAL LOW (ref 22–32)
Calcium: 8.4 mg/dL — ABNORMAL LOW (ref 8.9–10.3)
Chloride: 111 mmol/L (ref 98–111)
Creatinine, Ser: 1.49 mg/dL — ABNORMAL HIGH (ref 0.61–1.24)
GFR, Estimated: 51 mL/min — ABNORMAL LOW (ref >60)
Glucose, Bld: 143 mg/dL — ABNORMAL HIGH (ref 70–99)
Potassium: 3.6 mmol/L (ref 3.5–5.1)
Sodium: 140 mmol/L (ref 135–145)

## 2022-06-25 LAB — GLUCOSE, CAPILLARY
Glucose-Capillary: 124 mg/dL — ABNORMAL HIGH (ref 70–99)
Glucose-Capillary: 191 mg/dL — ABNORMAL HIGH (ref 70–99)
Glucose-Capillary: 238 mg/dL — ABNORMAL HIGH (ref 70–99)
Glucose-Capillary: 240 mg/dL — ABNORMAL HIGH (ref 70–99)

## 2022-06-25 SURGERY — LOOP RECORDER INSERTION
Anesthesia: LOCAL

## 2022-06-25 MED ORDER — ATORVASTATIN CALCIUM 80 MG PO TABS
80.0000 mg | ORAL_TABLET | Freq: Every day | ORAL | Status: DC
  Administered 2022-06-26 – 2022-07-01 (×6): 80 mg via ORAL
  Filled 2022-06-25 (×6): qty 1

## 2022-06-25 MED ORDER — METHOCARBAMOL 500 MG PO TABS: 500.0000 mg | ORAL_TABLET | Freq: Four times a day (QID) | ORAL | Status: DC | PRN

## 2022-06-25 MED ORDER — FLEET ENEMA 7-19 GM/118ML RE ENEM: 1.0000 | ENEMA | Freq: Once | RECTAL | Status: DC | PRN

## 2022-06-25 MED ORDER — NICOTINE 21 MG/24HR TD PT24: 21.0000 mg | MEDICATED_PATCH | Freq: Every day | TRANSDERMAL | 0 refills | Status: DC

## 2022-06-25 MED ORDER — ALUM & MAG HYDROXIDE-SIMETH 200-200-20 MG/5ML PO SUSP: 30.0000 mL | ORAL | Status: DC | PRN

## 2022-06-25 MED ORDER — LIDOCAINE-EPINEPHRINE 1 %-1:100000 IJ SOLN
INTRAMUSCULAR | Status: AC
  Filled 2022-06-25: qty 1

## 2022-06-25 MED ORDER — DIPHENHYDRAMINE HCL 12.5 MG/5ML PO ELIX: 12.5000 mg | ORAL_SOLUTION | Freq: Four times a day (QID) | ORAL | Status: DC | PRN

## 2022-06-25 MED ORDER — CLOPIDOGREL BISULFATE 75 MG PO TABS
75.0000 mg | ORAL_TABLET | Freq: Every day | ORAL | Status: DC
  Administered 2022-06-26 – 2022-07-01 (×6): 75 mg via ORAL
  Filled 2022-06-25 (×7): qty 1

## 2022-06-25 MED ORDER — IPRATROPIUM-ALBUTEROL 0.5-2.5 (3) MG/3ML IN SOLN
3.0000 mL | Freq: Four times a day (QID) | RESPIRATORY_TRACT | Status: DC | PRN
  Administered 2022-06-26 – 2022-06-29 (×3): 3 mL via RESPIRATORY_TRACT
  Filled 2022-06-25 (×3): qty 3

## 2022-06-25 MED ORDER — ACETAMINOPHEN 325 MG PO TABS: 325.0000 mg | ORAL_TABLET | ORAL | Status: DC | PRN

## 2022-06-25 MED ORDER — BLOOD PRESSURE CONTROL BOOK
Freq: Once | Status: AC
  Filled 2022-06-25: qty 1

## 2022-06-25 MED ORDER — ENOXAPARIN SODIUM 40 MG/0.4ML IJ SOSY: 40.0000 mg | PREFILLED_SYRINGE | INTRAMUSCULAR | Status: DC

## 2022-06-25 MED ORDER — CLOPIDOGREL BISULFATE 75 MG PO TABS: 75.0000 mg | ORAL_TABLET | Freq: Every day | ORAL | 0 refills | Status: DC

## 2022-06-25 MED ORDER — INSULIN ASPART 100 UNIT/ML IJ SOLN
0.0000 [IU] | Freq: Three times a day (TID) | INTRAMUSCULAR | Status: DC
  Administered 2022-06-25: 5 [IU] via SUBCUTANEOUS
  Administered 2022-06-26 – 2022-06-27 (×5): 3 [IU] via SUBCUTANEOUS
  Administered 2022-06-27: 2 [IU] via SUBCUTANEOUS
  Administered 2022-06-27 – 2022-06-28 (×4): 3 [IU] via SUBCUTANEOUS
  Administered 2022-06-28: 2 [IU] via SUBCUTANEOUS
  Administered 2022-06-28: 5 [IU] via SUBCUTANEOUS
  Administered 2022-06-29 (×2): 3 [IU] via SUBCUTANEOUS
  Administered 2022-06-29: 2 [IU] via SUBCUTANEOUS
  Administered 2022-06-29: 3 [IU] via SUBCUTANEOUS
  Administered 2022-06-30: 2 [IU] via SUBCUTANEOUS
  Administered 2022-06-30 (×2): 3 [IU] via SUBCUTANEOUS
  Administered 2022-06-30: 5 [IU] via SUBCUTANEOUS
  Administered 2022-07-01: 2 [IU] via SUBCUTANEOUS

## 2022-06-25 MED ORDER — PROCHLORPERAZINE MALEATE 5 MG PO TABS: 5.0000 mg | ORAL_TABLET | Freq: Four times a day (QID) | ORAL | Status: DC | PRN

## 2022-06-25 MED ORDER — ENOXAPARIN SODIUM 40 MG/0.4ML IJ SOSY
40.0000 mg | PREFILLED_SYRINGE | INTRAMUSCULAR | Status: DC
  Administered 2022-06-25 – 2022-06-30 (×6): 40 mg via SUBCUTANEOUS
  Filled 2022-06-25 (×6): qty 0.4

## 2022-06-25 MED ORDER — OFF THE BEAT BOOK
Freq: Once | Status: AC
  Filled 2022-06-25: qty 1

## 2022-06-25 MED ORDER — LIDOCAINE HCL (PF) 1 % IJ SOLN
INTRAMUSCULAR | Status: DC | PRN
  Administered 2022-06-25: 10 mL

## 2022-06-25 MED ORDER — PROCHLORPERAZINE 25 MG RE SUPP: 12.5000 mg | Freq: Four times a day (QID) | RECTAL | Status: DC | PRN

## 2022-06-25 MED ORDER — ASPIRIN 81 MG PO TBEC
81.0000 mg | DELAYED_RELEASE_TABLET | Freq: Every day | ORAL | Status: DC
  Administered 2022-06-26 – 2022-07-01 (×6): 81 mg via ORAL
  Filled 2022-06-25 (×6): qty 1

## 2022-06-25 MED ORDER — PROCHLORPERAZINE EDISYLATE 10 MG/2ML IJ SOLN: 5.0000 mg | Freq: Four times a day (QID) | INTRAMUSCULAR | Status: DC | PRN

## 2022-06-25 MED ORDER — INSULIN ASPART 100 UNIT/ML IJ SOLN: 0.0000 [IU] | Freq: Three times a day (TID) | INTRAMUSCULAR | 11 refills | Status: DC

## 2022-06-25 MED ORDER — LIVING WELL WITH DIABETES BOOK
Freq: Once | Status: AC
  Filled 2022-06-25: qty 1

## 2022-06-25 MED ORDER — TRAZODONE HCL 50 MG PO TABS
25.0000 mg | ORAL_TABLET | Freq: Every evening | ORAL | Status: DC | PRN
  Administered 2022-06-25 – 2022-06-26 (×2): 25 mg via ORAL
  Administered 2022-06-26 – 2022-06-30 (×5): 50 mg via ORAL
  Filled 2022-06-25 (×8): qty 1

## 2022-06-25 MED ORDER — SENNOSIDES-DOCUSATE SODIUM 8.6-50 MG PO TABS: 1.0000 | ORAL_TABLET | Freq: Every evening | ORAL | 0 refills | Status: DC | PRN

## 2022-06-25 MED ORDER — ENOXAPARIN SODIUM 40 MG/0.4ML IJ SOSY: 40.0000 mg | PREFILLED_SYRINGE | INTRAMUSCULAR | 0 refills | Status: DC

## 2022-06-25 MED ORDER — SORBITOL 70 % SOLN: 30.0000 mL | Freq: Every day | Status: DC | PRN

## 2022-06-25 MED ORDER — PANTOPRAZOLE SODIUM 40 MG PO TBEC: 40.0000 mg | DELAYED_RELEASE_TABLET | Freq: Every day | ORAL | 0 refills | Status: DC

## 2022-06-25 MED ORDER — NICOTINE 21 MG/24HR TD PT24
21.0000 mg | MEDICATED_PATCH | Freq: Every day | TRANSDERMAL | Status: DC
  Administered 2022-06-26 – 2022-06-30 (×5): 21 mg via TRANSDERMAL
  Filled 2022-06-25 (×6): qty 1

## 2022-06-25 MED ORDER — POLYETHYLENE GLYCOL 3350 17 G PO PACK: 17.0000 g | PACK | Freq: Every day | ORAL | Status: DC | PRN

## 2022-06-25 MED ORDER — GUAIFENESIN-DM 100-10 MG/5ML PO SYRP: 5.0000 mL | ORAL_SOLUTION | Freq: Four times a day (QID) | ORAL | Status: DC | PRN

## 2022-06-25 SURGICAL SUPPLY — 2 items
MONITOR LUX-DX II+ M312 (Prosthesis & Implant Heart) IMPLANT
PACK LOOP INSERTION (CUSTOM PROCEDURE TRAY) ×1 IMPLANT

## 2022-06-25 NOTE — Progress Notes (Addendum)
PMR Admission Coordinator Pre-Admission Assessment   Patient: Richard Flynn is an 66 y.o., male MRN: [id number] DOB: 1956-05-03 Height: _0  (165.1 cm) Weight: 81.4 kg   Insurance Information HMO: Yes    PPO:      PCP:      IPA:      80/20:      OTHER:  PRIMARY: Richard Flynn      Policy#: [id number]      Subscriber: Patient CM Name: Richard Flynn      Phone#: [phone]     Fax#: EPIC access Pre-Cert#: [id number] [place] from [facility] with [facility] with updates weekly, they have EPIC access      Employer:  Benefits:  Phone #: [phone]     Name:  Eff. Date: 06/29/2021-06/28/2022     Deduct: $0      Out of Pocket Max: $5,000 ($9.50 met)       CIR: $225/day co-pay for days 1-6, $0/day days 7-90      SNF: $0/day co pay days 1-20, $184 day co pay days 21-100, limited to 100 days/benefit period Outpatient: $20/visit co-pay limited by medical necessity      Home Health: 80% coverage, 20% co-insurance      DME: 80% coverage, 20% co insurance     Providers: patient choice   The "Data Collection Information Summary" for patients in Inpatient Rehabilitation Facilities with attached "Privacy Act [place] Records" was provided and verbally reviewed with: Patient and Family   Emergency Contact Information Contact Information       Name Relation Home Work Mobile    Richard Flynn Spouse [phone]   [phone]    Richard Flynn Daughter     [phone]           Current Medical History  Patient Admitting Diagnosis: CVA History of Present Illness: Richard Flynn is a 66 y.o. male who presented to [facility] on 06/20/2022 via EMS for evaluation of acute onset of right-sided weakness and aphasia. Pt found to have emergent L M1 occlusion and underwent a thrombectomy. Also received TNK. Follow-up CT 12/24 acute infarcts in left MCA and ACA territory. 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 2D Echo EF 25 to 30%. TEE needed to rule out cardiac thrombus. Needs pacemaker interrogation to  rule out afib.  PMH: coronary artery disease, chronic systolic CHF, cardiomyopathy, CKD stage III, type 2 diabetes mellitus, hyperlipidemia, hypertension, and PAD. Therapies are recommending intensive rehab program.   Complete NIHSS TOTAL: 2   Patient's medical record from [facility] has been reviewed by the rehabilitation admission coordinator and physician.   Past Medical History      Past Medical History:  Diagnosis Date   CAD (coronary artery disease)      a. [facility] notes suggest cardiac cath 2013 showed occluded proximal RCA with left to right collaterals, 20-40% proximal LAD, 70% distal circumflex artery.   Cardiomyopathy ([place])     Chronic systolic CHF (congestive heart failure) (HCC)     CKD (chronic kidney disease), stage III (HCC)     Diabetes mellitus without complication (HCC)     Hyperlipidemia     Hypertension     NSVT (nonsustained ventricular tachycardia) ([place])      mentioned in device interrogation   PAD (peripheral artery disease) ([place])       with aortoiliac occlusion (med rx per [facility])   Pancreatitis        Has the patient had major surgery during 100 days prior to  admission? No   Family History   family history includes CAD in his mother.   Current Medications   Current Facility-Administered Medications:    acetaminophen (TYLENOL) tablet 650 mg, 650 mg, Oral, Q4H PRN **OR** acetaminophen (TYLENOL) 160 MG/5ML solution 650 mg, 650 mg, Per Tube, Q4H PRN **OR** acetaminophen (TYLENOL) suppository 650 mg, 650 mg, Rectal, Q4H PRN, Donnetta Simpers, MD   aspirin EC tablet 81 mg, 81 mg, Oral, Daily, Rosalin Hawking, MD, 81 mg at 06/25/22 0924   atorvastatin (LIPITOR) tablet 80 mg, 80 mg, Oral, Daily, Rosalin Hawking, MD, 80 mg at 06/25/22 0938   clopidogrel (PLAVIX) tablet 75 mg, 75 mg, Oral, Daily, Rosalin Hawking, MD, 75 mg at 06/25/22 0924   enoxaparin (LOVENOX) injection 40 mg, 40 mg, Subcutaneous, Q24H, Rosalin Hawking, MD, 40 mg at 06/24/22 2101   influenza vaccine  adjuvanted (FLUAD) injection 0.5 mL, 0.5 mL, Intramuscular, Tomorrow-1000, Rathore, Vasundhra, MD   insulin aspart (novoLOG) injection 0-15 Units, 0-15 Units, Subcutaneous, TID WC & HS, Rosalin Hawking, MD, 3 Units at 06/25/22 1008   ipratropium-albuterol (DUONEB) 0.5-2.5 (3) MG/3ML nebulizer solution 3 mL, 3 mL, Nebulization, Q6H PRN, Donnetta Simpers, MD, 3 mL at 06/23/22 2023   nicotine (NICODERM CQ - dosed in mg/24 hours) patch 21 mg, 21 mg, Transdermal, Daily, Donnetta Simpers, MD, 21 mg at 06/25/22 1829   Oral care mouth rinse, 15 mL, Mouth Rinse, PRN, Rosalin Hawking, MD   Oral care mouth rinse, 15 mL, Mouth Rinse, 4 times per day, Rosalin Hawking, MD, 15 mL at 06/25/22 0924   pantoprazole (PROTONIX) EC tablet 40 mg, 40 mg, Oral, QHS, Rosalin Hawking, MD, 40 mg at 06/24/22 2100   pneumococcal 20-valent conjugate vaccine (PREVNAR 20) injection 0.5 mL, 0.5 mL, Intramuscular, Tomorrow-1000, Rathore, Vasundhra, MD   senna-docusate (Senokot-S) tablet 1 tablet, 1 tablet, Oral, QHS PRN, Donnetta Simpers, MD   sodium chloride flush (NS) 0.9 % injection 3 mL, 3 mL, Intravenous, Once, Godfrey Pick, MD   Patients Current Diet:  Diet Order                  DIET DYS 3 Room service appropriate? Yes with Assist; Fluid consistency: Thin  Diet effective now                         Precautions / Restrictions Precautions Precautions: Fall Precaution Comments: aphasic Restrictions Weight Bearing Restrictions: No    Has the patient had 2 or more falls or a fall with injury in the past year? No   Prior Activity Level   Prior Functional Level Self Care: Did the patient need help bathing, dressing, using the toilet or eating? Independent   Indoor Mobility: Did the patient need assistance with walking from room to room (with or without device)? Independent   Stairs: Did the patient need assistance with internal or external stairs (with or without device)? Independent   Functional Cognition: Did the  patient need help planning regular tasks such as shopping or remembering to take medications? Independent   Patient Information Are you of Hispanic, Latino/a,or Spanish origin?: A. No, not of Hispanic, Latino/a, or Spanish origin (per proxy) What is your race?: A. White (per proxy) Do you need or want an interpreter to communicate with a doctor or health care staff?: 0. No (per proxy)   Patient's Response To:  Health Literacy and Transportation Is the patient able to respond to health literacy and transportation needs?: No Health Literacy - How often  do you need to have someone help you when you read instructions, pamphlets, or other written material from your doctor or pharmacy?: Patient unable to respond   [place] Devices/Equipment: None Home Equipment: Conservation officer, nature (2 wheels), BSC/3in1   Prior Device Use: Indicate devices/aids used by the patient prior to current illness, exacerbation or injury? None of the above   Current Functional Level Cognition   Overall Cognitive Status: Difficult to assess Orientation Level: Oriented X4 Following Commands: Follows one step commands with increased time, Follows one step commands inconsistently Safety/Judgement: Decreased awareness of safety, Decreased awareness of deficits General Comments: able to use objects appropriately; able to identify correct amounts of coins with written response however was not able to verbally identitfy them; note perseveration    Extremity Assessment (includes Sensation/Coordination)   Upper Extremity Assessment: RUE deficits/detail RUE Deficits / Details: using funcitonally howevernote increased processing time with funcitonal tasks, i.e. brushing teeth - very slow movement RUE Coordination: decreased fine motor  Lower Extremity Assessment: Defer to PT evaluation     ADLs   Overall ADL's : Needs assistance/impaired Eating/Feeding: Set up, Supervision/  safety Grooming: Minimal assistance Upper Body Bathing: Minimal assistance Lower Body Bathing: Minimal assistance Upper Body Dressing : Minimal assistance Lower Body Dressing: Moderate assistance Toilet Transfer: Minimal assistance, Ambulation Toileting- Clothing Manipulation and Hygiene: Minimal assistance Functional mobility during ADLs: Minimal assistance     Mobility   Overal bed mobility: Needs Assistance Bed Mobility: Supine to Sit Supine to sit: Min assist, HOB elevated General bed mobility comments: OOB in chair     Transfers   Overall transfer level: Needs assistance Equipment used: None Transfers: Sit to/from Stand, Bed to chair/wheelchair/BSC Sit to Stand: Min guard Bed to/from chair/wheelchair/BSC transfer type:: Step pivot Step pivot transfers: Min assist General transfer comment: min guardc for safety     Ambulation / Gait / Stairs / Wheelchair Mobility   Ambulation/Gait Ambulation/Gait assistance: Counsellor (Feet): 300 Feet Assistive device: None Gait Pattern/deviations: Step-through pattern, Decreased stride length, Wide base of support General Gait Details: recipriocal gait pattern without AD, no LOB, able to follow ~50% of verbal cues for direction correctly. Gait velocity: dec Gait velocity interpretation: <1.31 ft/sec, indicative of household ambulator Stairs: Yes Stairs assistance: Min guard Stair Management: Two rails, Alternating pattern, Forwards Number of Stairs: 4 General stair comments: no LOB     Posture / Balance Balance Overall balance assessment: Needs assistance Sitting-balance support: Feet supported, No upper extremity supported Sitting balance-Leahy Scale: Good Standing balance support: During functional activity Standing balance-Leahy Scale: Fair Standing balance comment: requires external assist for dynamic balance     Special needs/care consideration Oxygen 2L in hospital    Previous Home Environment (from acute  therapy documentation) Living Arrangements: Spouse/significant other Available Help at Discharge: Family, Available 24 hours/day Type of Home: House Home Layout: One level Home Access: Stairs to enter Entrance Stairs-Rails: Left Entrance Stairs-Number of Steps: 3 Bathroom Shower/Tub: Chiropodist: Handicapped height Bathroom Accessibility: No Home Care Services: No   Discharge Living Setting Plans for Discharge Living Setting: Patient's home Type of Home at Discharge: House Discharge Home Layout: One level Discharge Home Access: Stairs to enter Entrance Stairs-Rails: [place] of Steps: 3 Discharge Bathroom Shower/Tub: [place] unit Discharge Bathroom Toilet: Handicapped height Discharge Bathroom Accessibility: No Does the patient have any problems obtaining your medications?: No   Social/Family/Support Systems Anticipated Caregiver: Malena Peer Anticipated Ambulance person Information: [phone] Caregiver Availability:  24/7 Discharge Plan Discussed with Primary Caregiver: Yes Is Caregiver In Agreement with Plan?: Yes Does Caregiver/Family have Issues with Lodging/Transportation while Pt is in Rehab?: No   Goals Patient/Family Goal for Rehab: mod I PT, OT, Independent SLP Expected length of stay: 12-14 days Pt/Family Agrees to Admission and willing to participate: Yes Program Orientation Provided & Reviewed with Pt/Caregiver Including Roles  & Responsibilities: Yes  Barriers to Discharge: Insurance for SNF coverage   Decrease burden of Care through IP rehab admission: OtherN/A   Possible need for SNF placement upon discharge: not anticipated   Patient Condition: I have reviewed medical records from [facility], spoken with  TOC , and patient and spouse. I met with patient at the bedside for inpatient rehabilitation assessment.  Patient will benefit from ongoing PT, OT, and SLP, can actively participate in 3 hours of therapy a  day 5 days of the week, and can make measurable gains during the admission.  Patient will also benefit from the coordinated team approach during an Inpatient Acute Rehabilitation admission.  The patient will receive intensive therapy as well as Rehabilitation physician, nursing, social worker, and care management interventions.  Due to safety, skin/wound care, disease management, medication administration, pain management, and patient education the patient requires 24 hour a day rehabilitation nursing.  The patient is currently min assist with mobility and basic ADLs.  Discharge setting and therapy post discharge at home with home health is anticipated.  Patient has agreed to participate in the Acute Inpatient Rehabilitation Program and will admit today.   Preadmission Screen Completed By:  Amanda Cockayne with updates by Michel Santee, 06/25/2022 11:25 AM ______________________________________________________________________   Discussed status with Dr. Dagoberto Ligas on 06/25/22  at 11:25 AM and received approval for admission today.   Admission Coordinator: Shann Medal, PT, DPT and Michel Santee, PT, time 11:25 AM Sudie Grumbling 06/25/22     Assessment/Plan: Diagnosis: Does the need for close, 24 hr/day Medical supervision in concert with the patient's rehab needs make it unreasonable for this patient to be served in a less intensive setting? 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 Yes Does the patient require coordinated care of a physician, rehab nurse, PT, OT, and SLP to address physical and functional deficits in the context of the above medical diagnosis(es)? Yes Addressing deficits in  the following areas: balance, endurance, locomotion, strength, transferring, bowel/bladder control, bathing, dressing, feeding, grooming, toileting, cognition, speech, and language Can the patient actively participate in an intensive therapy program of at least 3 hrs of therapy 5 days a week? Yes The potential for patient to make measurable gains while on inpatient rehab is good Anticipated functional outcomes upon discharge from inpatient rehab: modified independent PT, modified independent OT, modified independent SLP Estimated rehab length of stay to reach the above functional goals is: 12-14 days  Anticipated discharge destination: Home 10. Overall Rehab/Functional Prognosis: good     MD Signature:

## 2022-06-25 NOTE — TOC Transition Note (Signed)
Transition of Care [facility]) - CM/SW Discharge Note   Patient Details  Name: Richard Flynn MRN: [id number] Date of Birth: 1955/10/13  [facility]) CM/SW Contact:  Pollie Friar, RN Phone Number: 06/25/2022, 2:23 PM   Clinical Narrative:    Pt is discharging to CIR today. CM is signing off.    Final next level of care: IP Rehab Facility Barriers to Discharge: No Barriers Identified   Patient Goals and CMS Choice CMS Medicare.gov Compare Post Acute Care list provided to:: Patient Choice offered to / list presented to : Patient  Discharge Placement                         Discharge Plan and Services Additional resources added to the After Visit Summary for                                       Social Determinants of Health (SDOH) Interventions SDOH Screenings   Food Insecurity: No Food Insecurity (06/23/2022)  Housing: Low Risk  (06/23/2022)  Transportation Needs: No Transportation Needs (06/23/2022)  Utilities: Not At Risk (06/23/2022)  Tobacco Use: Medium Risk (06/24/2022)     Readmission Risk Interventions     No data to display

## 2022-06-25 NOTE — Discharge Summary (Addendum)
Stroke Discharge Summary  Patient ID: Richard Flynn   MRN: [id number]      DOB: 03-Jul-1955  Date of Admission: 06/20/2022 Date of Discharge: 06/25/2022  Attending Physician:  Stroke, Md, MD, Stroke MD Consultant(s):   cardiologyDr. Stanford Breed, Dr. Curt Bears Patient's PCP:  Finis Bud, MD  Discharge Diagnoses:  Principal Problem:   Stroke: right MCA M1 occlusion status post TNK and IR with TICI3, etiology likely due to cardiomyopathy with low EF versus occult A-fib   Active Problems:   Cardiomyopathy, ischemic   Essential hypertension   Hyperlipidemia   Controlled diabetes mellitus type II without complication ([place])   Smoker   PAD (peripheral artery disease) ([place])   Cardiac pacemaker in situ   Hypokalemia   CKD (chronic kidney disease) stage 3, GFR 30-59 ml/min ([facility])   Medications to be continued on Rehab Allergies as of 06/25/2022   No Known Allergies      Medication List     STOP taking these medications    empagliflozin 10 MG Tabs tablet Commonly known as: Jardiance   FISH OIL PO   glipiZIDE 5 MG 24 hr tablet Commonly known as: GLUCOTROL XL   guaiFENesin-dextromethorphan 100-10 MG/5ML syrup Commonly known as: ROBITUSSIN DM   Lantus SoloStar 100 UNIT/ML Solostar Pen Generic drug: insulin glargine   metFORMIN 500 MG 24 hr tablet Commonly known as: GLUCOPHAGE-XR   Multivitamin Adult Chew   OVER THE COUNTER MEDICATION       TAKE these medications    aspirin EC 81 MG tablet Take 81 mg by mouth daily. Swallow whole.   atorvastatin 80 MG tablet Commonly known as: LIPITOR TAKE ONE TABLET BY MOUTH DAILY   carvedilol 12.5 MG tablet Commonly known as: COREG TAKE ONE TABLET BY MOUTH TWICE A DAY WITH A MEAL What changed: See the new instructions.   clopidogrel 75 MG tablet Commonly known as: PLAVIX Take 1 tablet (75 mg total) by mouth daily. Start taking on: June 26, 2022   enoxaparin 40 MG/0.4ML injection Commonly known as:  LOVENOX Inject 0.4 mLs (40 mg total) into the skin daily for 30 doses.   furosemide 20 MG tablet Commonly known as: LASIX TAKE ONE TABLET BY MOUTH DAILY   insulin aspart 100 UNIT/ML injection Commonly known as: novoLOG Inject 0-15 Units into the skin 4 (four) times daily -  with meals and at bedtime.   nicotine 21 mg/24hr patch Commonly known as: NICODERM CQ - dosed in mg/24 hours Place 1 patch (21 mg total) onto the skin daily. Start taking on: June 26, 2022   nitroGLYCERIN 0.4 MG SL tablet Commonly known as: NITROSTAT Place 0.4 mg under the tongue every 5 (five) minutes as needed for chest pain.   pantoprazole 40 MG tablet Commonly known as: PROTONIX Take 1 tablet (40 mg total) by mouth at bedtime.   sacubitril-valsartan 24-26 MG Commonly known as: ENTRESTO Take 1 tablet by mouth 2 (two) times daily.   senna-docusate 8.6-50 MG tablet Commonly known as: Senokot-S Take 1 tablet by mouth at bedtime as needed for mild constipation or moderate constipation.        LABORATORY STUDIES CBC    Component Value Date/Time   WBC 9.9 06/25/2022 0620   RBC 3.46 (L) 06/25/2022 0620   HGB 10.7 (L) 06/25/2022 0620   HCT 31.0 (L) 06/25/2022 0620   PLT 148 (L) 06/25/2022 0620   MCV 89.6 06/25/2022 0620   MCH 30.9 06/25/2022 0620   MCHC 34.5 06/25/2022  0620   RDW 15.1 06/25/2022 0620   LYMPHSABS 1.3 06/21/2022 0452   MONOABS 1.0 06/21/2022 0452   EOSABS 0.1 06/21/2022 0452   BASOSABS 0.1 06/21/2022 0452   CMP    Component Value Date/Time   NA 140 06/25/2022 0620   NA 142 07/18/2020 1156   K 3.6 06/25/2022 0620   CL 111 06/25/2022 0620   CO2 19 (L) 06/25/2022 0620   GLUCOSE 143 (H) 06/25/2022 0620   BUN 29 (H) 06/25/2022 0620   BUN 30 (H) 07/18/2020 1156   CREATININE 1.49 (H) 06/25/2022 0620   CALCIUM 8.4 (L) 06/25/2022 0620   PROT 6.4 (L) 06/20/2022 1548   ALBUMIN 3.3 (L) 06/20/2022 1548   AST 27 06/20/2022 1548   ALT 15 06/20/2022 1548   ALKPHOS 77 06/20/2022  1548   BILITOT 1.1 06/20/2022 1548   GFRNONAA 51 (L) 06/25/2022 0620   GFRAA 53 (L) 07/18/2020 1156   COAGS Lab Results  Component Value Date   INR 1.1 06/20/2022   Lipid Panel    Component Value Date/Time   CHOL 111 06/21/2022 0452   TRIG 134 06/22/2022 0342   HDL 14 (L) 06/21/2022 0452   CHOLHDL 7.9 06/21/2022 0452   VLDL UNABLE TO CALCULATE IF TRIGLYCERIDE OVER 400 mg/dL 06/21/2022 0452   LDLCALC UNABLE TO CALCULATE IF TRIGLYCERIDE OVER 400 mg/dL 06/21/2022 0452   HgbA1C  Lab Results  Component Value Date   HGBA1C 6.7 (H) 06/15/2020   Urinalysis No results found for: "COLORURINE", "APPEARANCEUR", "LABSPEC", "PHURINE", "GLUCOSEU", "HGBUR", "BILIRUBINUR", "KETONESUR", "PROTEINUR", "UROBILINOGEN", "NITRITE", "LEUKOCYTESUR" Urine Drug Screen     Component Value Date/Time   LABOPIA NONE DETECTED 06/21/2022 0710   COCAINSCRNUR NONE DETECTED 06/21/2022 0710   LABBENZ NONE DETECTED 06/21/2022 0710   AMPHETMU NONE DETECTED 06/21/2022 0710   THCU NONE DETECTED 06/21/2022 0710   LABBARB NONE DETECTED 06/21/2022 0710    Alcohol Level    Component Value Date/Time   ETH <10 06/20/2022 1548     SIGNIFICANT DIAGNOSTIC STUDIES IR US Guide Vasc Access Right  Result Date: 06/24/2022 INDICATION: Expressive aphasia with left gaze deviation and left-sided neglect. Occluded left middle cerebral artery CT angiogram of head and neck. EXAM: 1. EMERGENT LARGE VESSEL OCCLUSION THROMBOLYSIS (anterior CIRCULATION) COMPARISON:  CT angiogram of the head and neck of June 20, 2022. MEDICATIONS: Ancef 2 g IV antibiotic was administered within 1 hour of the procedure. ANESTHESIA/SEDATION: General anesthesia. CONTRAST:  Omnipaque 300 approximately 130 mL. FLUOROSCOPY TIME:  Fluoroscopy Time: 60 minutes 6 seconds (2420 mGy). COMPLICATIONS: None immediate. TECHNIQUE: Following a full explanation of the procedure along with the potential associated complications, an informed witnessed consent was  obtained. The risks of intracranial hemorrhage of 10%, worsening neurological deficit, ventilator dependency, death and inability to revascularize were all reviewed in detail with the patient's spouse. The patient was then put under general anesthesia by the Department of Anesthesiology at [facility]. The right groin was prepped and draped in the usual sterile fashion. Thereafter using modified Seldinger technique, transfemoral access into the right common femoral artery was obtained without difficulty. Over a 0.035 inch guidewire a 5 French Pinnacle sheath was inserted. Through this, and also over a 0.035 inch guidewire a 5 [place] JB 1 catheter was advanced. The resistance was encountered in the lower abdominal aortic region. 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 Also noted was occluded left common femoral artery just proximal  to the bifurcation. The right forearm to the wrist was then prepped and draped in the usual manner. The right radial artery was then identified with ultrasound, and its morphology documented in the radiology PACS system. A dorsal palmar anastomosis was verified to present. Access into the right radial artery was then obtained using ultrasound guidance and a micropuncture set. A 6/7 sheath was then advanced over an 018 inch micro guidewire. The micro guidewire and the obturator were then removed. Good aspiration was obtained from the side port of the radial sheath. A cocktail of 2000 units of heparin, 2.5 mg of verapamil, and 200 mcg of nitroglycerin was then infused through the sheath without event. A right radial arteriogram was then performed. Over a 035 inch glidewire, a combination of a [place] guide catheter inside of which was a [employer] 2 5 French 125 cm support catheter was advanced through the aortic arch region, and selectively positioned in the distal left  common carotid artery and then into the distal left internal carotid artery. FINDINGS: Left common carotid arteriogram demonstrates the left external carotid artery and its major branches to be widely patent. The left internal carotid artery at the bulb demonstrates a smooth shallow plaque along the anterior wall of the bulb without evidence of ulceration. More distally, the left internal carotid artery was seen to opacify to the cranial skull base. The petrous, the cavernous and the supraclinoid left ICA demonstrate wide patency. The left posterior communicating artery was seen to opacify the left posterior cerebral artery. The left anterior cerebral artery opacified into the capillary and venous phases. Transient cross-filling via the anterior communicating artery of the right anterior cerebral A2 segment was evident. 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 Initial attempts were using a 115 cm 5 [place] guide catheter advanced with an 021 162 cm microcatheter over an 014 inch standard intracerebral micro guidewire with a moderate J configuration to the proximal M1 segment. The micro guidewire with microcatheter was then selectively positioned in the inferior division M2 M3 region followed by the microcatheter. After confirming safe positioning of the tip of the microcatheter, a 4 mm x 40 mm Solitaire X retrieval device was deployed. The [place] guide catheter was advanced into the proximal portion of the clot. Thereafter, aspiration was applied at the hub of the [place] guide catheter for approximately 2 minutes. Thereafter, the combination of the retrieval device, the microcatheter, and the [place] guide catheter were retrieved and removed. A control arteriogram performed through the Benchmark guide catheter in the distal left internal artery demonstrate revascularization following 3 passes with the above combination. A fourth pass was then made with the advancement of a 132 6  [place] guide catheter advanced to the supraclinoid left ICA over a combination of 021 162 microcatheter over an Aristotle standard micro guidewire with a moderate J configuration. The microcatheter was advanced over the micro guidewire to the M2 M3 region followed by retrieval of the micro guidewire. After having confirmed safe positioning of the tip of the micro microcatheter, a 4 mm x 40 mm Solitaire X retrieval device was deployed such that the proximal portion of the device was just proximal to the occluded left M1 segment distally. The 6 [place] Catalyst guide catheter was advanced and engaged into the proximal portion of the clot. Aspiration was then applied with a Penumbra aspiration device at the hub of the Catalyst guide catheter for approximately 2-1/2 minutes. Thereafter, the combination of the retrieval device,  the microcatheter, and the 6 [place] Catalyst guide catheter were retrieved and removed. A control arteriogram performed through diagnostic 5 French Simmons 2 catheter in the left common carotid artery demonstrated complete revascularization of the left middle cerebral artery distribution achieving a TICI 3 revascularization. The diagnostic catheter was then selectively positioned in the right common carotid artery. An arteriogram performed demonstrates the right external carotid artery and its major branches to be widely patent. The right internal carotid artery at the bulb to the cranial skull base demonstrate wide patency. The petrous, the cavernous and the supraclinoid right ICA remained patent. The right middle and the right anterior cerebral artery opacify into the capillary and venous phases. Prompt cross-filling via the anterior communicating artery of the left anterior cerebral A2 segment and distally was noted. Partial opacification of the distal left anterior cerebral A1 segment was also noted. The diagnostic catheter was removed. A wrist band was applied for hemostasis at the  right radial puncture site. Distal right radial pulse was verified to be present. A 5 French closure device was then deployed for hemostasis at the right groin puncture site. Distal pulses remained Dopplerable in both feet unchanged. Patient was extubated. Upon recovery, the patient was able to maintain oxygen saturations. Pupils were 3 mm bilaterally sluggishly reactive. Patient is still demonstrated difficulty with speech, and comprehension. Moved his left side spontaneously. Was able to raise his right hand against gravity. It was then transferred to the neuro ICU for post revascularization care. PROCEDURE: As above. 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 PLAN: As per referring MD. Electronically Signed   By: Luanne Bras M.D.   On: 06/24/2022 14:06   IR PERCUTANEOUS ART THROMBECTOMY/INFUSION INTRACRANIAL INC DIAG ANGIO  Result Date: 06/24/2022 INDICATION: Expressive aphasia with left gaze deviation and left-sided neglect. Occluded left middle cerebral artery CT angiogram of head and neck. EXAM: 1. EMERGENT LARGE VESSEL OCCLUSION THROMBOLYSIS (anterior CIRCULATION) COMPARISON:  CT angiogram of the head and neck of June 20, 2022. MEDICATIONS: Ancef 2 g IV antibiotic was administered within 1 hour of the procedure. ANESTHESIA/SEDATION: General anesthesia. CONTRAST:  Omnipaque 300 approximately 130 mL. FLUOROSCOPY TIME:  Fluoroscopy Time: 60 minutes 6 seconds (2420 mGy). COMPLICATIONS: None immediate. TECHNIQUE: Following a full explanation of the procedure along with the potential associated complications, an informed witnessed consent was obtained. The risks of intracranial hemorrhage of 10%, worsening neurological deficit, ventilator dependency, death and inability to revascularize were all reviewed in detail with the patient's spouse. The patient was  then put under general anesthesia by the Department of Anesthesiology at [facility]. The right groin was prepped and draped in the usual sterile fashion. Thereafter using modified Seldinger technique, transfemoral access into the right common femoral artery was obtained without difficulty. Over a 0.035 inch guidewire a 5 French Pinnacle sheath was inserted. Through this, and also over a 0.035 inch guidewire a 5 [place] JB 1 catheter was advanced. The resistance was encountered in the lower abdominal aortic region. 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 Also noted was occluded left common femoral artery just proximal to the bifurcation. The right forearm to the wrist was then prepped and draped in the usual manner. The right radial artery was then identified with ultrasound, and its morphology documented in the radiology PACS system. A dorsal  palmar anastomosis was verified to present. Access into the right radial artery was then obtained using ultrasound guidance and a micropuncture set. A 6/7 sheath was then advanced over an 018 inch micro guidewire. The micro guidewire and the obturator were then removed. Good aspiration was obtained from the side port of the radial sheath. A cocktail of 2000 units of heparin, 2.5 mg of verapamil, and 200 mcg of nitroglycerin was then infused through the sheath without event. A right radial arteriogram was then performed. Over a 035 inch glidewire, a combination of a [place] guide catheter inside of which was a [employer] 2 5 French 125 cm support catheter was advanced through the aortic arch region, and selectively positioned in the distal left common carotid artery and then into the distal left internal carotid artery. FINDINGS: Left common carotid arteriogram demonstrates the left external carotid artery and its major branches to be widely patent. The left  internal carotid artery at the bulb demonstrates a smooth shallow plaque along the anterior wall of the bulb without evidence of ulceration. More distally, the left internal carotid artery was seen to opacify to the cranial skull base. The petrous, the cavernous and the supraclinoid left ICA demonstrate wide patency. The left posterior communicating artery was seen to opacify the left posterior cerebral artery. The left anterior cerebral artery opacified into the capillary and venous phases. Transient cross-filling via the anterior communicating artery of the right anterior cerebral A2 segment was evident. 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 Initial attempts were using a 115 cm 5 [place] guide catheter advanced with an 021 162 cm microcatheter over an 014 inch standard intracerebral micro guidewire with a moderate J configuration to the proximal M1 segment. The micro guidewire with microcatheter was then selectively positioned in the inferior division M2 M3 region followed by the microcatheter. After confirming safe positioning of the tip of the microcatheter, a 4 mm x 40 mm Solitaire X retrieval device was deployed. The [place] guide catheter was advanced into the proximal portion of the clot. Thereafter, aspiration was applied at the hub of the [place] guide catheter for approximately 2 minutes. Thereafter, the combination of the retrieval device, the microcatheter, and the [place] guide catheter were retrieved and removed. A control arteriogram performed through the Benchmark guide catheter in the distal left internal artery demonstrate revascularization following 3 passes with the above combination. A fourth pass was then made with the advancement of a 132 6 [place] guide catheter advanced to the supraclinoid left ICA over a combination of 021 162 microcatheter over an Aristotle standard micro guidewire with a moderate J configuration. The microcatheter was advanced  over the micro guidewire to the M2 M3 region followed by retrieval of the micro guidewire. After having confirmed safe positioning of the tip of the micro microcatheter, a 4 mm x 40 mm Solitaire X retrieval device was deployed such that the proximal portion of the device was just proximal to the occluded left M1 segment distally. The 6 [place] Catalyst guide catheter was advanced and engaged into the proximal portion of the clot. Aspiration was then applied with a Penumbra aspiration device at the hub of the Catalyst guide catheter for approximately 2-1/2 minutes. Thereafter, the combination of the retrieval device, the microcatheter, and the 6 [place] Catalyst guide catheter were retrieved and removed. A control arteriogram performed through diagnostic 5 French Simmons 2 catheter in the left common carotid artery demonstrated complete revascularization of the left middle cerebral  artery distribution achieving a TICI 3 revascularization. The diagnostic catheter was then selectively positioned in the right common carotid artery. An arteriogram performed demonstrates the right external carotid artery and its major branches to be widely patent. The right internal carotid artery at the bulb to the cranial skull base demonstrate wide patency. The petrous, the cavernous and the supraclinoid right ICA remained patent. The right middle and the right anterior cerebral artery opacify into the capillary and venous phases. Prompt cross-filling via the anterior communicating artery of the left anterior cerebral A2 segment and distally was noted. Partial opacification of the distal left anterior cerebral A1 segment was also noted. The diagnostic catheter was removed. A wrist band was applied for hemostasis at the right radial puncture site. Distal right radial pulse was verified to be present. A 5 French closure device was then deployed for hemostasis at the right groin puncture site. Distal pulses remained Dopplerable in both feet  unchanged. Patient was extubated. Upon recovery, the patient was able to maintain oxygen saturations. Pupils were 3 mm bilaterally sluggishly reactive. Patient is still demonstrated difficulty with speech, and comprehension. Moved his left side spontaneously. Was able to raise his right hand against gravity. It was then transferred to the neuro ICU for post revascularization care. PROCEDURE: As above. 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 PLAN: As per referring MD. Electronically Signed   By: Luanne Bras M.D.   On: 06/24/2022 10:09   IR CT Head Ltd  Result Date: 06/24/2022 INDICATION: Expressive aphasia with left gaze deviation and left-sided neglect. Occluded left middle cerebral artery CT angiogram of head and neck. EXAM: 1. EMERGENT LARGE VESSEL OCCLUSION THROMBOLYSIS (anterior CIRCULATION) COMPARISON:  CT angiogram of the head and neck of June 20, 2022. MEDICATIONS: Ancef 2 g IV antibiotic was administered within 1 hour of the procedure. ANESTHESIA/SEDATION: General anesthesia. CONTRAST:  Omnipaque 300 approximately 130 mL. FLUOROSCOPY TIME:  Fluoroscopy Time: 60 minutes 6 seconds (2420 mGy). COMPLICATIONS: None immediate. TECHNIQUE: Following a full explanation of the procedure along with the potential associated complications, an informed witnessed consent was obtained. The risks of intracranial hemorrhage of 10%, worsening neurological deficit, ventilator dependency, death and inability to revascularize were all reviewed in detail with the patient's spouse. The patient was then put under general anesthesia by the Department of Anesthesiology at [facility]. The right groin was prepped and draped in the usual sterile fashion. Thereafter using modified Seldinger technique, transfemoral access into the right common femoral artery was  obtained without difficulty. Over a 0.035 inch guidewire a 5 French Pinnacle sheath was inserted. Through this, and also over a 0.035 inch guidewire a 5 [place] JB 1 catheter was advanced. The resistance was encountered in the lower abdominal aortic region. 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 Also noted was occluded left common femoral artery just proximal to the bifurcation. The right forearm to the wrist was then prepped and draped in the usual manner. The right radial artery was then identified with ultrasound, and its morphology documented in the radiology PACS system. A dorsal palmar anastomosis was verified to present. Access into the right radial artery was then obtained using ultrasound guidance and a micropuncture set. A 6/7 sheath was then advanced over an 018 inch micro guidewire. The micro guidewire and the obturator were then  removed. Good aspiration was obtained from the side port of the radial sheath. A cocktail of 2000 units of heparin, 2.5 mg of verapamil, and 200 mcg of nitroglycerin was then infused through the sheath without event. A right radial arteriogram was then performed. Over a 035 inch glidewire, a combination of a [place] guide catheter inside of which was a [employer] 2 5 French 125 cm support catheter was advanced through the aortic arch region, and selectively positioned in the distal left common carotid artery and then into the distal left internal carotid artery. FINDINGS: Left common carotid arteriogram demonstrates the left external carotid artery and its major branches to be widely patent. The left internal carotid artery at the bulb demonstrates a smooth shallow plaque along the anterior wall of the bulb without evidence of ulceration. More distally, the left internal carotid artery was seen to opacify to the cranial skull base. The petrous, the cavernous and the  supraclinoid left ICA demonstrate wide patency. The left posterior communicating artery was seen to opacify the left posterior cerebral artery. The left anterior cerebral artery opacified into the capillary and venous phases. Transient cross-filling via the anterior communicating artery of the right anterior cerebral A2 segment was evident. 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 Initial attempts were using a 115 cm 5 [place] guide catheter advanced with an 021 162 cm microcatheter over an 014 inch standard intracerebral micro guidewire with a moderate J configuration to the proximal M1 segment. The micro guidewire with microcatheter was then selectively positioned in the inferior division M2 M3 region followed by the microcatheter. After confirming safe positioning of the tip of the microcatheter, a 4 mm x 40 mm Solitaire X retrieval device was deployed. The [place] guide catheter was advanced into the proximal portion of the clot. Thereafter, aspiration was applied at the hub of the [place] guide catheter for approximately 2 minutes. Thereafter, the combination of the retrieval device, the microcatheter, and the [place] guide catheter were retrieved and removed. A control arteriogram performed through the Benchmark guide catheter in the distal left internal artery demonstrate revascularization following 3 passes with the above combination. A fourth pass was then made with the advancement of a 132 6 [place] guide catheter advanced to the supraclinoid left ICA over a combination of 021 162 microcatheter over an Aristotle standard micro guidewire with a moderate J configuration. The microcatheter was advanced over the micro guidewire to the M2 M3 region followed by retrieval of the micro guidewire. After having confirmed safe positioning of the tip of the micro microcatheter, a 4 mm x 40 mm Solitaire X retrieval device was deployed such that the proximal portion of the device  was just proximal to the occluded left M1 segment distally. The 6 [place] Catalyst guide catheter was advanced and engaged into the proximal portion of the clot. Aspiration was then applied with a Penumbra aspiration device at the hub of the Catalyst guide catheter for approximately 2-1/2 minutes. Thereafter, the combination of the retrieval device, the microcatheter, and the 6 [place] Catalyst guide catheter were retrieved and removed. A control arteriogram performed through diagnostic 5 French Simmons 2 catheter in the left common carotid artery demonstrated complete revascularization of the left middle cerebral artery distribution achieving a TICI 3 revascularization. The diagnostic catheter was then selectively positioned in the right common carotid artery. An arteriogram performed demonstrates the right external carotid artery and its major branches to be widely patent. The right internal carotid artery  at the bulb to the cranial skull base demonstrate wide patency. The petrous, the cavernous and the supraclinoid right ICA remained patent. The right middle and the right anterior cerebral artery opacify into the capillary and venous phases. Prompt cross-filling via the anterior communicating artery of the left anterior cerebral A2 segment and distally was noted. Partial opacification of the distal left anterior cerebral A1 segment was also noted. The diagnostic catheter was removed. A wrist band was applied for hemostasis at the right radial puncture site. Distal right radial pulse was verified to be present. A 5 French closure device was then deployed for hemostasis at the right groin puncture site. Distal pulses remained Dopplerable in both feet unchanged. Patient was extubated. Upon recovery, the patient was able to maintain oxygen saturations. Pupils were 3 mm bilaterally sluggishly reactive. Patient is still demonstrated difficulty with speech, and comprehension. Moved his left side spontaneously. Was able to  raise his right hand against gravity. It was then transferred to the neuro ICU for post revascularization care. PROCEDURE: As above. 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 PLAN: As per referring MD. Electronically Signed   By: Luanne Bras M.D.   On: 06/24/2022 10:09   IR ANGIO INTRA EXTRACRAN SEL COM CAROTID INNOMINATE UNI R MOD SED  Result Date: 06/24/2022 INDICATION: Expressive aphasia with left gaze deviation and left-sided neglect. Occluded left middle cerebral artery CT angiogram of head and neck. EXAM: 1. EMERGENT LARGE VESSEL OCCLUSION THROMBOLYSIS (anterior CIRCULATION) COMPARISON:  CT angiogram of the head and neck of June 20, 2022. MEDICATIONS: Ancef 2 g IV antibiotic was administered within 1 hour of the procedure. ANESTHESIA/SEDATION: General anesthesia. CONTRAST:  Omnipaque 300 approximately 130 mL. FLUOROSCOPY TIME:  Fluoroscopy Time: 60 minutes 6 seconds (2420 mGy). COMPLICATIONS: None immediate. TECHNIQUE: Following a full explanation of the procedure along with the potential associated complications, an informed witnessed consent was obtained. The risks of intracranial hemorrhage of 10%, worsening neurological deficit, ventilator dependency, death and inability to revascularize were all reviewed in detail with the patient's spouse. The patient was then put under general anesthesia by the Department of Anesthesiology at [facility]. The right groin was prepped and draped in the usual sterile fashion. Thereafter using modified Seldinger technique, transfemoral access into the right common femoral artery was obtained without difficulty. Over a 0.035 inch guidewire a 5 French Pinnacle sheath was inserted. Through this, and also over a 0.035 inch guidewire a 5 [place] JB 1 catheter was advanced. The resistance was encountered in the  lower abdominal aortic region. 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 Also noted was occluded left common femoral artery just proximal to the bifurcation. The right forearm to the wrist was then prepped and draped in the usual manner. The right radial artery was then identified with ultrasound, and its morphology documented in the radiology PACS system. A dorsal palmar anastomosis was verified to present. Access into the right radial artery was then obtained using ultrasound guidance and a micropuncture set. A 6/7 sheath was then advanced over an 018 inch micro guidewire. The micro guidewire and the obturator were then removed. Good aspiration was obtained from the side port of the radial sheath. A cocktail of 2000 units of heparin, 2.5 mg of verapamil, and 200 mcg of nitroglycerin was then infused through the  sheath without event. A right radial arteriogram was then performed. Over a 035 inch glidewire, a combination of a [place] guide catheter inside of which was a [employer] 2 5 French 125 cm support catheter was advanced through the aortic arch region, and selectively positioned in the distal left common carotid artery and then into the distal left internal carotid artery. FINDINGS: Left common carotid arteriogram demonstrates the left external carotid artery and its major branches to be widely patent. The left internal carotid artery at the bulb demonstrates a smooth shallow plaque along the anterior wall of the bulb without evidence of ulceration. More distally, the left internal carotid artery was seen to opacify to the cranial skull base. The petrous, the cavernous and the supraclinoid left ICA demonstrate wide patency. The left posterior communicating artery was seen to opacify the left posterior cerebral artery. The left anterior cerebral artery opacified into the capillary and venous phases.  Transient cross-filling via the anterior communicating artery of the right anterior cerebral A2 segment was evident. 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 Initial attempts were using a 115 cm 5 [place] guide catheter advanced with an 021 162 cm microcatheter over an 014 inch standard intracerebral micro guidewire with a moderate J configuration to the proximal M1 segment. The micro guidewire with microcatheter was then selectively positioned in the inferior division M2 M3 region followed by the microcatheter. After confirming safe positioning of the tip of the microcatheter, a 4 mm x 40 mm Solitaire X retrieval device was deployed. The [place] guide catheter was advanced into the proximal portion of the clot. Thereafter, aspiration was applied at the hub of the [place] guide catheter for approximately 2 minutes. Thereafter, the combination of the retrieval device, the microcatheter, and the [place] guide catheter were retrieved and removed. A control arteriogram performed through the Benchmark guide catheter in the distal left internal artery demonstrate revascularization following 3 passes with the above combination. A fourth pass was then made with the advancement of a 132 6 [place] guide catheter advanced to the supraclinoid left ICA over a combination of 021 162 microcatheter over an Aristotle standard micro guidewire with a moderate J configuration. The microcatheter was advanced over the micro guidewire to the M2 M3 region followed by retrieval of the micro guidewire. After having confirmed safe positioning of the tip of the micro microcatheter, a 4 mm x 40 mm Solitaire X retrieval device was deployed such that the proximal portion of the device was just proximal to the occluded left M1 segment distally. The 6 [place] Catalyst guide catheter was advanced and engaged into the proximal portion of the clot. Aspiration was then applied with a Penumbra aspiration device at  the hub of the Catalyst guide catheter for approximately 2-1/2 minutes. Thereafter, the combination of the retrieval device, the microcatheter, and the 6 [place] Catalyst guide catheter were retrieved and removed. A control arteriogram performed through diagnostic 5 French Simmons 2 catheter in the left common carotid artery demonstrated complete revascularization of the left middle cerebral artery distribution achieving a TICI 3 revascularization. The diagnostic catheter was then selectively positioned in the right common carotid artery. An arteriogram performed demonstrates the right external carotid artery and its major branches to be widely patent. The right internal carotid artery at the bulb to the cranial skull base demonstrate wide patency. The petrous, the cavernous and the supraclinoid right ICA remained patent. The right middle and the right anterior cerebral artery opacify into the  capillary and venous phases. Prompt cross-filling via the anterior communicating artery of the left anterior cerebral A2 segment and distally was noted. Partial opacification of the distal left anterior cerebral A1 segment was also noted. The diagnostic catheter was removed. A wrist band was applied for hemostasis at the right radial puncture site. Distal right radial pulse was verified to be present. A 5 French closure device was then deployed for hemostasis at the right groin puncture site. Distal pulses remained Dopplerable in both feet unchanged. Patient was extubated. Upon recovery, the patient was able to maintain oxygen saturations. Pupils were 3 mm bilaterally sluggishly reactive. Patient is still demonstrated difficulty with speech, and comprehension. Moved his left side spontaneously. Was able to raise his right hand against gravity. It was then transferred to the neuro ICU for post revascularization care. PROCEDURE: As above. 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 PLAN: As per referring MD. Electronically Signed   By: Luanne Bras M.D.   On: 06/24/2022 10:09   CT HEAD WO CONTRAST (5MM)  Result Date: 06/21/2022 CLINICAL DATA:  Neuro deficit with acute stroke suspected EXAM: CT HEAD WITHOUT CONTRAST TECHNIQUE: Contiguous axial images were obtained from the base of the skull through the vertex without intravenous contrast. RADIATION DOSE REDUCTION: This exam was performed according to the departmental dose-optimization program which includes automated exposure control, adjustment of the mA and/or kV according to patient size and/or use of iterative reconstruction technique. COMPARISON:  CT from yesterday FINDINGS: Brain: Cytotoxic edema in the superficial left temporal lobe and insula. Separate area of cytotoxic edema in the parasagittal left frontal lobe anteriorly. Mild involvement at the level of the left low parietal cortex. No hemorrhage, hydrocephalus, or midline shift. Vascular: No hyperdense vessel or unexpected calcification. Skull: Normal. Negative for fracture or focal lesion. Sinuses/Orbits: No acute finding. 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 No acute hemorrhage. Electronically Signed   By: Jorje Guild M.D.   On: 06/21/2022 11:49   CT ANGIO HEAD NECK W WO CM  Result Date: 06/20/2022 CLINICAL DATA:  Stroke suspected. EXAM: CT ANGIOGRAPHY HEAD AND NECK TECHNIQUE: Multidetector CT imaging of the head and neck was performed using the standard protocol during bolus administration of intravenous contrast. Multiplanar CT image reconstructions and MIPs were obtained to evaluate the vascular anatomy. Carotid stenosis measurements (when applicable) are obtained utilizing NASCET criteria, using the distal internal carotid diameter as the denominator. RADIATION DOSE REDUCTION:  This exam was performed according to the departmental dose-optimization program which includes automated exposure control, adjustment of the mA and/or kV according to patient size and/or use of iterative reconstruction technique. CONTRAST:  2m OMNIPAQUE IOHEXOL 350 MG/ML SOLN COMPARISON:  Same-day noncontrast CT head, CTA head/neck, and catheter angiogram FINDINGS: CTA NECK FINDINGS Aortic arch: There is calcified plaque in the aortic arch. The origins of the major branch vessels are patent. The subclavian arteries are patent to the level imaged. Right carotid system: The right common, internal, and external carotid arteries are patent with mixed plaque at bifurcation but no hemodynamically significant stenosis or occlusion. There is no dissection or aneurysm. Left carotid system: The left common, internal, and external carotid arteries are patent with mild plaque at the bifurcation but no hemodynamically significant stenosis or occlusion there is no dissection or aneurysm. Vertebral arteries: There is calcified plaque in the right V1  segment resulting in severe stenosis. The remainder of the right vertebral artery is patent with no other hemodynamically significant stenosis or occlusion. The left vertebral artery is patent, without hemodynamically significant stenosis or occlusion. There is no dissection or aneurysm. Skeleton: There is no acute osseous abnormality or suspicious osseous lesion. There is no visible canal hematoma. Other neck: The soft tissues of the neck are unremarkable. Upper chest: There is emphysema in the lung apices. Review of the MIP images confirms the above findings CTA HEAD FINDINGS Anterior circulation: There is calcified plaque in the intracranial ICAs without hemodynamically significant stenosis or occlusion There has been interval recanalization of the previously occluded left M1 segment which now appears widely patent. The distal MCA branches appear well perfused. The right M1  segment and distal branches are patent. The bilateral ACAs are patent, without proximal stenosis or occlusion. The anterior communicating artery is normal. There is no aneurysm or AVM. Posterior circulation: There is unchanged calcified plaque in the right V4 segment resulting in multifocal mild-to-moderate stenosis. The non dominant left V4 segment is diminutive after the PICA origin. The basilar artery is patent. The major cerebellar arteries appear patent. The bilateral PCAs are patent, without proximal stenosis or occlusion. Left larger than right posterior communicating arteries are identified. There is no aneurysm or AVM. Venous sinuses: Patent. Anatomic variants: None. Review of the MIP images confirms the above findings IMPRESSION: 1. Interval recanalization of the left M1 segment which now appears widely patent. The distal branch vessels now appear well perfused. 2. Otherwise, no significant interval change since the CTA head/neck from earlier the same day. 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 Electronically Signed   By: Valetta Mole M.D.   On: 06/20/2022 17:19   CT HEAD WO CONTRAST (5MM)  Result Date: 06/20/2022 CLINICAL DATA:  Neuro deficit. Stroke suspected. Patient became aphasic earlier today. EXAM: CT HEAD WITHOUT CONTRAST TECHNIQUE: Contiguous axial images were obtained from the base of the skull through the vertex without intravenous contrast. RADIATION DOSE REDUCTION: This exam was performed according to the departmental dose-optimization program which includes automated exposure control, adjustment of the mA and/or kV according to patient size and/or use of iterative reconstruction technique. COMPARISON:  Code stroke CT head obtained earlier today, 06/20/2022 at 10 a.m. FINDINGS: Brain: Normal ventricles. No hydrocephalus. No parenchymal or extra-axial masses or mass effect.  No acute or chronic infarct. No abnormal extra-axial fluid collections. No intracranial hemorrhage. Vascular: Hyperdense left middle cerebral artery is similar in appearance to the earlier exam. Skull: Normal. Negative for fracture or focal lesion. Sinuses/Orbits: Dependent fluid in the left maxillary sinus and a posterior left ethmoid air cell. Mild scattered ethmoid sinus mucosal thickening. Globes and orbits are unremarkable. Other: None. IMPRESSION: 1. No evidence of acute infarct.  No intracranial hemorrhage. 2. No change from the CT head performed earlier today. Patient has since undergone CT angiography and neurointerventional procedures. Electronically Signed   By: Lajean Manes M.D.   On: 06/20/2022 16:52   ECHOCARDIOGRAM COMPLETE  Result Date: 06/20/2022    ECHOCARDIOGRAM REPORT   Patient Name:   Richard Flynn Date of Exam: 06/20/2022 Medical Rec #:  [id number]     Height:       65.0 in Accession #:    [id number]    Weight:       175.9 lb Date of Birth:  04-Sep-1955     BSA:  1.873 m Patient Age:    66 years      BP:           116/64 mmHg Patient Gender: M             HR:           62 bpm. Exam Location:  Inpatient Procedure: 2D Echo, Cardiac Doppler, Color Doppler and Intracardiac            Opacification Agent Indications:    Stroke  History:        Patient has prior history of Echocardiogram examinations, most                 recent 06/15/2020. CHF, CAD, Defibrillator, PAD; Risk                 Factors:Diabetes, Dyslipidemia and Former Smoker. CKD.  Sonographer:    Clayton Lefort RDCS (AE) Referring Phys: [id number] [place]  1. Left ventricular ejection fraction, by estimation, is 25 to 30%. The left ventricle has severely decreased function. The left ventricle demonstrates global hypokinesis. The left ventricular internal cavity size was moderately dilated. There is mild left ventricular hypertrophy. Left ventricular diastolic parameters are consistent with Grade I diastolic  dysfunction (impaired relaxation).  2. Right ventricular systolic function is mildly reduced. The right ventricular size is normal. Tricuspid regurgitation signal is inadequate for assessing PA pressure.  3. Left atrial size was mild to moderately dilated.  4. The mitral valve is normal in structure. Trivial mitral valve regurgitation. No evidence of mitral stenosis.  5. The aortic valve is tricuspid. There is mild calcification of the aortic valve. Aortic valve regurgitation is not visualized. No aortic stenosis is present.  6. The inferior vena cava is normal in size with greater than 50% respiratory variability, suggesting right atrial pressure of 3 mmHg. Conclusion(s)/Recommendation(s): No intracardiac source of embolism detected on this transthoracic study. Consider a transesophageal echocardiogram to exclude cardiac source of embolism if clinically indicated. No left ventricular mural or apical thrombus/thrombi. FINDINGS  Left Ventricle: Left ventricular ejection fraction, by estimation, is 25 to 30%. The left ventricle has severely decreased function. The left ventricle demonstrates global hypokinesis. Definity contrast agent was given IV to delineate the left ventricular endocardial borders. 3D left ventricular ejection fraction analysis performed but not reported based on interpreter judgement due to suboptimal tracking. The left ventricular internal cavity size was moderately dilated. There is mild left ventricular hypertrophy. Left ventricular diastolic parameters are consistent with Grade I diastolic dysfunction (impaired relaxation). Right Ventricle: The right ventricular size is normal. No increase in right ventricular wall thickness. Right ventricular systolic function is mildly reduced. Tricuspid regurgitation signal is inadequate for assessing PA pressure. Left Atrium: Left atrial size was mild to moderately dilated. Right Atrium: Right atrial size was normal in size. Pericardium: There is no  evidence of pericardial effusion. Mitral Valve: The mitral valve is normal in structure. Trivial mitral valve regurgitation. No evidence of mitral valve stenosis. Tricuspid Valve: The tricuspid valve is normal in structure. Tricuspid valve regurgitation is trivial. No evidence of tricuspid stenosis. Aortic Valve: The aortic valve is tricuspid. There is mild calcification of the aortic valve. Aortic valve regurgitation is not visualized. No aortic stenosis is present. Aortic valve mean gradient measures 3.0 mmHg. Aortic valve peak gradient measures 5.5 mmHg. Aortic valve area, by VTI measures 3.35 cm. Pulmonic Valve: The pulmonic valve was normal in structure. Pulmonic valve regurgitation is not visualized. No evidence of pulmonic stenosis.  Aorta: The aortic root is normal in size and structure. Ascending aorta measurements are within normal limits for age when indexed to body surface area. Venous: The inferior vena cava is normal in size with greater than 50% respiratory variability, suggesting right atrial pressure of 3 mmHg. IAS/Shunts: The atrial septum is grossly normal. Additional Comments: A device lead is visualized in the right ventricle.  LEFT VENTRICLE PLAX 2D LVIDd:         6.50 cm      Diastology LVIDs:         5.70 cm      LV e' medial:    4.35 cm/s LV PW:         1.30 cm      LV E/e' medial:  19.7 LV IVS:        1.40 cm      LV e' lateral:   4.90 cm/s LVOT diam:     2.20 cm      LV E/e' lateral: 17.5 LV SV:         89 LV SV Index:   48 LVOT Area:     3.80 cm                              3D Volume EF: LV Volumes (MOD)            3D EF:        38 % LV vol d, MOD A4C: 262.0 ml LV EDV:       210 ml LV vol s, MOD A4C: 159.0 ml LV ESV:       131 ml LV SV MOD A4C:     262.0 ml LV SV:        79 ml RIGHT VENTRICLE RV Basal diam:  3.00 cm RV S prime:     14.70 cm/s TAPSE (M-mode): 3.0 cm LEFT ATRIUM             Index        RIGHT ATRIUM           Index LA diam:        4.90 cm 2.62 cm/m   RA Area:     17.70  cm LA Vol (A2C):   90.2 ml 48.15 ml/m  RA Volume:   48.90 ml  26.11 ml/m LA Vol (A4C):   59.4 ml 31.71 ml/m LA Biplane Vol: 74.5 ml 39.77 ml/m  AORTIC VALVE AV Area (Vmax):    3.51 cm AV Area (Vmean):   3.42 cm AV Area (VTI):     3.35 cm AV Vmax:           117.00 cm/s AV Vmean:          77.000 cm/s AV VTI:            0.267 m AV Peak Grad:      5.5 mmHg AV Mean Grad:      3.0 mmHg LVOT Vmax:         108.00 cm/s LVOT Vmean:        69.200 cm/s LVOT VTI:          0.235 m LVOT/AV VTI ratio: 0.88  AORTA Ao Root diam: 3.20 cm Ao Asc diam:  3.80 cm MITRAL VALVE MV Area (PHT): 2.95 cm     SHUNTS MV Decel Time: 257 msec     Systemic VTI:  0.24 m MV E velocity: 85.90 cm/s  Systemic Diam: 2.20 cm MV A velocity: 127.00 cm/s MV E/A ratio:  0.68 Cherlynn Kaiser MD Electronically signed by Cherlynn Kaiser MD Signature Date/Time: 06/20/2022/3:52:41 PM    Final    CT ANGIO HEAD NECK W WO CM (CODE STROKE)  Result Date: 06/20/2022 CLINICAL DATA:  Neuro deficit with acute stroke suspected EXAM: CT ANGIOGRAPHY HEAD AND NECK TECHNIQUE: Multidetector CT imaging of the head and neck was performed using the standard protocol during bolus administration of intravenous contrast. Multiplanar CT image reconstructions and MIPs were obtained to evaluate the vascular anatomy. Carotid stenosis measurements (when applicable) are obtained utilizing NASCET criteria, using the distal internal carotid diameter as the denominator. RADIATION DOSE REDUCTION: This exam was performed according to the departmental dose-optimization program which includes automated exposure control, adjustment of the mA and/or kV according to patient size and/or use of iterative reconstruction technique. CONTRAST:  9m OMNIPAQUE IOHEXOL 350 MG/ML SOLN COMPARISON:  Head CT from earlier today FINDINGS: CTA NECK FINDINGS Aortic arch: Atheromatous plaque Right carotid system: Calcified plaque at the bifurcation without flow limiting stenosis or ulceration Left  carotid system: Atheromatous plaque at the bifurcation, mainly calcified. No ulceration or flow limiting stenosis. Vertebral arteries: The right vertebral artery is dominant. Bulky calcified plaque at the right V1 segment with high-grade narrowing, patent lumen is not visible for measurement purposes. Skeleton: Cervical spine degeneration asymmetric to the left. C2-3 central protrusion impinging on the cord. Other neck: No evidence of mass or inflammation. Upper chest: Negative Review of the MIP images confirms the above findings CTA HEAD FINDINGS Anterior circulation: Calcified plaque along the carotid siphons. Abrupt cut off of the left M1 segment consistent with embolus seen on prior CT without contrast. Underfilling of downstream reconstitute branches. Posterior circulation: Atheromatous plaque along the V4 segment on the right. No branch occlusion, beading, or aneurysm. Venous sinuses: Unremarkable Anatomic variants: Hypoplastic left A1 segment. Review of the MIP images confirms the above findings Finding is known to neurology team, patient is headed to embolectomy. IMPRESSION: 1. Emergent large vessel occlusion at the left M1 segment. There is underfilling of downstream reconstituted vessels. No proximal flow limiting stenosis or ulceration in this distribution. 2. 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 Electronically Signed   By: JJorje GuildM.D.   On: 06/20/2022 10:34   CT HEAD CODE STROKE WO CONTRAST  Result Date: 06/20/2022 CLINICAL DATA:  Code stroke.  Right-sided weakness and aphasia EXAM: CT HEAD WITHOUT CONTRAST TECHNIQUE: Contiguous axial images were obtained from the base of the skull through the vertex without intravenous contrast. RADIATION DOSE REDUCTION: This exam was performed according to the departmental dose-optimization program which includes automated exposure control, adjustment of the mA and/or kV according to patient  size and/or use of iterative reconstruction technique. COMPARISON:  None Available. FINDINGS: Brain: No evidence of acute infarction, hemorrhage, hydrocephalus, extra-axial collection or mass lesion/mass effect. Vascular: Hyperdense left distal M1 segment. Skull: Normal. Negative for fracture or focal lesion. Sinuses/Orbits: Partial bilateral mastoid opacification. A page has been placed to attending neurologist. ASPECTS [facility] - Ganglionic level infarction (caudate, lentiform nuclei, internal capsule, insula, M1-M3 cortex): 7 - Supraganglionic infarction (M4-M6 cortex): 3 Total score (0-10 with 10 being normal): 10 IMPRESSION: 1. Hyperdense left MCA, CTA is pending.  Aspects is 10. 2. No intracranial hemorrhage. Electronically Signed   By: JJorje GuildM.D.   On: 06/20/2022 10:08       HISTORY OF PRESENT ILLNESS  Richard Flynn is a 66 y.o. male with a medical history significant for coronary artery disease, chronic systolic CHF, cardiomyopathy, CKD stage III, type 2 diabetes mellitus, hyperlipidemia, hypertension, and PAD presented to the ED on 06/20/2022 via EMS for evaluation of acute onset of right-sided weakness.  Patient woke up in his usual state of health this morning and was making coffee with his wife.  Wife watched the patient walking into the living room and when he suddenly collapsed and rolled onto the couch and was unable to speak with a right facial droop.  On EMS arrival, they witnessed a period of unresponsiveness requiring bag ventilation.  EMS also noted a blood glucose of 62 initially and administered D10 with improvement of CBG to 154 on patient arrival to the ED.   LKW: 08:30 with symptom onset witnessed by patient's wife TNK given?: yes, CT head was obtained and reviewed by attending neurologist and neurointerventional radiologist without evidence of ICH.  Patient's wife was contacted via telephone and consent was obtained after discussing risks,  benefits, and alternatives of TNK.  TNK administered at 10:10 AM.  IR Thrombectomy?  Yes, vessel imaging revealed emergent LVO with left MCA M1 occlusion and IR was activated.  Again, patient's wife was contacted via telephone and consent was obtained after discussing risks, benefits, and alternatives of IR.  Modified Rankin Scale: 0-Completely asymptomatic and back to baseline post- stroke  HOSPITAL COURSE Richard Flynn is a 66 y.o. male with history of CAD, CHF, cardiomyopathy, pacemaker, CKD stage III, diabetes, hypertension, hyperlipidemia and PAD presenting with a sudden collapse with aphasia and right facial droop.  He was noted to be initially hypoglycemic by EMS, but when dextrose was given and blood glucose rose, symptoms remained.  Head CT on arrival to the ED was negative for acute ICH, so TNK was administered.  Patient was then found to have left MCA M1 occlusion on CTA head and was taken to IR for mechanical thrombectomy.  TICI 3 flow was achieved.  Patient has been hemodynamically stable overnight but continues to have some expressive aphasia.   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  Hyperdense left MCA  CTA head & neck LVO at left M1 segment, high-grade right V1 stenosis Post IR CT no hemorrhage Post IR CTA head and neck left M1 patent  Follow-up CT 12/24 acute infarcts in left MCA and ACA territory MRI unable to perform due to incompatible pacemaker 2D Echo EF 25 to 30%  pacemaker not able to be interrogation to rule out afib, only v lead and will not be able to detect A fib  Per card, will place loop recorder LDL 48 HgbA1c pending UDS neg aspirin 81 mg daily prior to admission, now on ASA and plavix DAPT. Therapy recommendations: CIR Disposition: CIR today  Cardiomyopathy CHF EF 25 to 30% Home meds including Entresto, Coreg May  related to current stroke Cardiology to consult - will do loop On DAPT  Hypertension Home meds: Carvedilol 12.5 mg twice daily Long-term BP goal normotensive   Hyperlipidemia Home meds: Atorvastatin 80 mg daily, resumed in hospital LDL 48, goal < 70 Continue statin at discharge   Diabetes type II Controlled Home meds: Jardiance 10 mg daily, glipizide 5 mg twice daily, Lantus 28 units daily, metformin 1000 mg twice daily Hypoglycemia on presentation, received a D10  HgbA1c pending, goal < 7.0 Close PCP follow-up as outpatient  Dysphagia Modified diet, dys 3 and thin liquid   Other Stroke Risk Factors Advanced Age >/= 68  Former cigarette smoker CAD PAD   Other Active Problems Pacemaker CKD 3a,  creatinine 1.45 Leukocytosis 13.7. Afebrile will monitor  Anemia Hgb 10.7 Monitor  Hypokalemia K 3.3 - supplement  DISCHARGE EXAM Blood pressure (!) 113/96, pulse 89, temperature 97.7 F (36.5 C), temperature source Oral, resp. rate 15, height '5\' 5"'$  (1.651 m), weight 81.4 kg, SpO2 100 %.  PHYSICAL EXAM General: Alert, well-developed, patient in no acute distress. Sitting in chair.  Respiratory: Regular, unlabored respirations    NEURO:  Mental Status: Responds to name and is able to follow simple commands Speech/Language: speech is without dysarthria but with expressive aphasia. Naming and repetition are impaired.   Cranial Nerves:  II: PERRL.  III, IV, VI: EOMI.  V: Sensation is intact to light touch and symmetrical to face.  VII: Smile is symmetrical.   VIII: hearing intact to voice. IX, X:Phonation is normal.  KG:[id number] shrug 5/5. XII: tongue is midline without fasciculations. Motor: 5/5 strength to all muscle groups tested.  Tone: is normal and bulk is normal Sensation- Intact to light touch bilaterally.  Gait- deferred  Discharge Diet      Diet   DIET DYS 3 Room service appropriate? Yes with Assist; Fluid consistency: Thin   liquids  DISCHARGE  PLAN Disposition:  Transfer to [place] for ongoing PT, OT and ST aspirin 81 mg daily and clopidogrel 75 mg daily for secondary stroke prevention Recommend ongoing stroke risk factor control by Primary Care Physician at time of discharge from inpatient rehabilitation. Follow-up PCP Finis Bud, MD in 2 weeks following discharge from rehab. Follow-up in [facility] in 8 weeks following discharge from rehab, office to schedule an appointment.   45 minutes were spent preparing discharge.   Pt seen by Neuro NP/APP and later by MD. Note/plan to be edited by MD as needed.    Otelia Santee, DNP, AGACNP-BC [facility] Please use AMION for pager and EPIC for messaging  ATTENDING NOTE: I reviewed above note and agree with the assessment and plan.   No acute event overnight. Discussed with cardiology, recommend loop recorder placement to rule out afib. Will continue DAPT and statin. Ready for CIR placement after loop recorder today. Follow up at [place] in 4 weeks.  For detailed assessment and plan, please refer to above/below as I have made changes wherever appropriate.   Rosalin Hawking, MD PhD Stroke Neurology 06/25/2022 5:09 PM

## 2022-06-25 NOTE — Anesthesia Postprocedure Evaluation (Signed)
Anesthesia Post Note  Patient: Richard Flynn  Procedure(s) Performed: IR WITH ANESTHESIA     Patient location during evaluation: PACU Anesthesia Type: General Level of consciousness: awake Pain management: pain level controlled Vital Signs Assessment: post-procedure vital signs reviewed and stable Respiratory status: spontaneous breathing, nonlabored ventilation, respiratory function stable and patient connected to nasal cannula oxygen Cardiovascular status: blood pressure returned to baseline and stable Postop Assessment: no apparent nausea or vomiting Anesthetic complications: no   No notable events documented.               Effie Berkshire

## 2022-06-25 NOTE — Consult Note (Signed)
ELECTROPHYSIOLOGY CONSULT NOTE  Patient ID: Richard Flynn MRN: [id number], DOB/AGE: [id number]   Admit date: 06/20/2022 Date of Consult: 06/25/2022  Primary Physician: Finis Bud, MD Primary Cardiologist: Dr. Stanford Breed EP: Dr. Curt Bears (last 2021) Reason for Consultation: Cryptogenic stroke; recommendations regarding Implantable Loop Recorder, requested by Dr. Erlinda Hong  History of Present Illness WM SAHAGUN was admitted on 06/20/2022 with sudden collapse with aphasia and right facial droop. He was noted to be initially hypoglycemic by EMS, but when dextrose was given and blood glucose rose, symptoms remained and found with stroke >> TNK >> IR with mechanical thrombectomy left MCA M1    PMHx includes: CAD, ICM, chronic CHF, ICD, DM, HTN, HLD, PVD, CKD (III)  DEVICE info BSci single chamber ICD implanted 11/18/12  Neurology notes: right MCA M1 occlusion status post TNK and IR with TICI3, etiology likely due to cardiomyopathy with low EF versus occult A-fib .  he has undergone workup for stroke including echocardiogram and carotid angio.  The patient has been monitored on telemetry which has demonstrated sinus rhythm with no arrhythmias.    Neurology has deferred TEE   Echocardiogram this admission demonstrated .   1. Left ventricular ejection fraction, by estimation, is 25 to 30%. The  left ventricle has severely decreased function. The left ventricle  demonstrates global hypokinesis. The left ventricular internal cavity size  was moderately dilated. There is mild  left ventricular hypertrophy. Left ventricular diastolic parameters are  consistent with Grade I diastolic dysfunction (impaired relaxation).   2. Right ventricular systolic function is mildly reduced. The right  ventricular size is normal. Tricuspid regurgitation signal is inadequate  for assessing PA pressure.   3. Left atrial size was mild to moderately dilated.   4. The mitral valve is normal in structure.  Trivial mitral valve  regurgitation. No evidence of mitral stenosis.   5. The aortic valve is tricuspid. There is mild calcification of the  aortic valve. Aortic valve regurgitation is not visualized. No aortic  stenosis is present.   6. The inferior vena cava is normal in size with greater than 50%  respiratory variability, suggesting right atrial pressure of 3 mmHg.   Conclusion(s)/Recommendation(s): No intracardiac source of embolism  detected on this transthoracic study. Consider a transesophageal  echocardiogram to exclude cardiac source of embolism if clinically  indicated. No left ventricular mural or apical  thrombus/thrombi.    Lab work is reviewed.   Prior to admission, the patient denies chest pain, shortness of breath, dizziness, palpitations, or syncope.  They are recovering from their stroke with plans to CIR at discharge.    Past Medical History:  Diagnosis Date   CAD (coronary artery disease)    a. [facility] notes suggest cardiac cath 2013 showed occluded proximal RCA with left to right collaterals, 20-40% proximal LAD, 70% distal circumflex artery.   Cardiomyopathy ([place])    Chronic systolic CHF (congestive heart failure) (HCC)    CKD (chronic kidney disease), stage III (HCC)    Diabetes mellitus without complication (HCC)    Hyperlipidemia    Hypertension    NSVT (nonsustained ventricular tachycardia) ([place])    mentioned in device interrogation   PAD (peripheral artery disease) ([place])     with aortoiliac occlusion (med rx per [facility])   Pancreatitis      Surgical History:  Past Surgical History:  Procedure Laterality Date   CHOLECYSTECTOMY     ICD IMPLANT     IR ANGIO INTRA EXTRACRAN  SEL COM CAROTID INNOMINATE UNI R MOD SED  06/20/2022   IR CT HEAD LTD  06/20/2022   IR PERCUTANEOUS ART THROMBECTOMY/INFUSION INTRACRANIAL INC DIAG ANGIO  06/20/2022   IR US GUIDE VASC ACCESS RIGHT  06/20/2022   LEFT HEART CATH AND CORONARY ANGIOGRAPHY N/A 06/17/2020    Procedure: LEFT HEART CATH AND CORONARY ANGIOGRAPHY;  Surgeon: Jettie Booze, MD;  Location: [facility];  Service: Cardiovascular;  Laterality: N/A;   RADIOLOGY WITH ANESTHESIA N/A 06/20/2022   Procedure: IR WITH ANESTHESIA;  Surgeon: Radiologist, Medication, MD;  Location: [facility];  Service: Radiology;  Laterality: N/A;   RIGHT HEART CATH N/A 06/17/2020   Procedure: RIGHT HEART CATH;  Surgeon: Jettie Booze, MD;  Location: [facility];  Service: Cardiovascular;  Laterality: N/A;     Medications Prior to Admission  Medication Sig Dispense Refill Last Dose   aspirin EC 81 MG tablet Take 81 mg by mouth daily. Swallow whole.   06/19/2022   atorvastatin (LIPITOR) 80 MG tablet TAKE ONE TABLET BY MOUTH DAILY (Patient taking differently: Take 80 mg by mouth daily.) 90 tablet 3 06/19/2022   carvedilol (COREG) 12.5 MG tablet TAKE ONE TABLET BY MOUTH TWICE A DAY WITH A MEAL (Patient taking differently: Take 12.5 mg by mouth 2 (two) times daily with a meal.) 180 tablet 3 06/19/2022 at 2200   empagliflozin (JARDIANCE) 10 MG TABS tablet Take 1 tablet (10 mg total) by mouth daily. 90 tablet 0 06/19/2022   furosemide (LASIX) 20 MG tablet TAKE ONE TABLET BY MOUTH DAILY (Patient taking differently: Take 20 mg by mouth daily.) 90 tablet 3 06/19/2022   glipiZIDE (GLUCOTROL XL) 5 MG 24 hr tablet Take 5 mg by mouth 2 (two) times daily.   06/19/2022   guaiFENesin-dextromethorphan (ROBITUSSIN DM) 100-10 MG/5ML syrup Take 5 mLs by mouth every 4 (four) hours as needed for cough.   06/17/2022   LANTUS SOLOSTAR 100 UNIT/ML Solostar Pen Inject 28 Units into the skin at bedtime.   06/19/2022   metFORMIN (GLUCOPHAGE-XR) 500 MG 24 hr tablet Take 1,000 mg by mouth 2 (two) times daily.   06/19/2022   Multiple Vitamins-Minerals (MULTIVITAMIN ADULT) CHEW Chew 1 tablet by mouth at bedtime.   06/19/2022   nitroGLYCERIN (NITROSTAT) 0.4 MG SL tablet Place 0.4 mg under the tongue every 5 (five) minutes as  needed for chest pain.   unk   Omega-3 Fatty Acids (FISH OIL PO) Take 1 capsule by mouth daily.   06/19/2022   OVER THE COUNTER MEDICATION Take 1 tablet by mouth every 12 (twelve) hours as needed (cough). [place] Cold Crush tablets   Past Week   sacubitril-valsartan (ENTRESTO) 24-26 MG Take 1 tablet by mouth 2 (two) times daily. 180 tablet 3 Past Month    Inpatient Medications:   aspirin EC  81 mg Oral Daily   atorvastatin  80 mg Oral Daily   clopidogrel  75 mg Oral Daily   enoxaparin (LOVENOX) injection  40 mg Subcutaneous Q24H   influenza vaccine adjuvanted  0.5 mL Intramuscular Tomorrow-1000   insulin aspart  0-15 Units Subcutaneous TID WC & HS   nicotine  21 mg Transdermal Daily   mouth rinse  15 mL Mouth Rinse 4 times per day   pantoprazole  40 mg Oral QHS   pneumococcal 20-valent conjugate vaccine  0.5 mL Intramuscular Tomorrow-1000   sodium chloride flush  3 mL Intravenous Once    Allergies: No Known Allergies  Social History   Socioeconomic  History   Marital status: Married    Spouse name: Not on file   Number of children: 0   Years of education: Not on file   Highest education level: Not on file  Occupational History   Not on file  Tobacco Use   Smoking status: Former    Packs/day: 1.00    Years: 50.00    Total pack years: 50.00    Types: Cigarettes    Quit date: 2012    Years since quitting: 11.9   Smokeless tobacco: Never   Tobacco comments:    vape  Vaping Use   Vaping Use: Some days   Substances: Nicotine  Substance and Sexual Activity   Alcohol use: Yes    Comment: 1 beer per month   Drug use: Not Currently   Sexual activity: Yes  Other Topics Concern   Not on file  Social History Narrative   Not on file   Social Determinants of Health   Financial Resource Strain: Not on file  Food Insecurity: No Food Insecurity (06/23/2022)   Hunger Vital Sign    Worried About Running Out of Food in the Last Year: Never true    Ran Out of Food in the Last  Year: Never true  Transportation Needs: No Transportation Needs (06/23/2022)   PRAPARE - Hydrologist (Medical): No    Lack of Transportation (Non-Medical): No  Physical Activity: Not on file  Stress: Not on file  Social Connections: Not on file  Intimate Partner Violence: Not At Risk (06/23/2022)   Humiliation, Afraid, Rape, and Kick questionnaire    Fear of Current or Ex-Partner: No    Emotionally Abused: No    Physically Abused: No    Sexually Abused: No     Family History  Problem Relation Age of Onset   CAD Mother       Review of Systems: All other systems reviewed and are otherwise negative except as noted above.  Physical Exam: Vitals:   06/25/22 0342 06/25/22 0400 06/25/22 0600 06/25/22 0811  BP: (!) 130/92   138/80  Pulse: 76 84 78 69  Resp: (!) '21 16 14 15  '$ Temp: 97.9 F (36.6 C)   98 F (36.7 C)  TempSrc: Oral   Oral  SpO2:  98% 99% 98%  Weight:      Height:        GEN- The patient is well appearing, alert and oriented x 3 today.   Head- normocephalic, atraumatic Eyes-  Sclera clear, conjunctiva pink Ears- hearing intact Oropharynx- clear Neck- supple Lungs- CTA b/l, normal work of breathing Heart- RRR, no murmurs, rubs or gallops  GI- soft, NT, ND Extremities- no clubbing, cyanosis, or edema MS- no significant deformity or atrophy Skin- no rash or lesion Psych- euthymic mood, full affect   Labs:   Lab Results  Component Value Date   WBC 9.9 06/25/2022   HGB 10.7 (L) 06/25/2022   HCT 31.0 (L) 06/25/2022   MCV 89.6 06/25/2022   PLT 148 (L) 06/25/2022    Recent Labs  Lab 06/20/22 1548 06/21/22 0452 06/25/22 0620  NA 137   < > 140  K 4.9   < > 3.6  CL 107   < > 111  CO2 19*   < > 19*  BUN 21   < > 29*  CREATININE 1.31*   < > 1.49*  CALCIUM 8.1*   < > 8.4*  PROT 6.4*  --   --  BILITOT 1.1  --   --   ALKPHOS 77  --   --   ALT 15  --   --   AST 27  --   --   GLUCOSE 140*   < > 143*   < > = values in  this interval not displayed.   No results found for: "CKTOTAL", "CKMB", "CKMBINDEX", "TROPONINI" Lab Results  Component Value Date   CHOL 111 06/21/2022   Lab Results  Component Value Date   HDL 14 (L) 06/21/2022   Lab Results  Component Value Date   LDLCALC UNABLE TO CALCULATE IF TRIGLYCERIDE OVER 400 mg/dL 06/21/2022   Lab Results  Component Value Date   TRIG 134 06/22/2022   TRIG 553 (H) 06/21/2022   Lab Results  Component Value Date   CHOLHDL 7.9 06/21/2022   Lab Results  Component Value Date   LDLDIRECT 48 06/22/2022    No results found for: "DDIMER"   Radiology/Studies:   IR US Guide Vasc Access Right Result Date: 06/24/2022 INDICATION: Expressive aphasia with left gaze deviation and left-sided neglect. Occluded left middle cerebral artery CT angiogram of head and neck. EXAM: 1. EMERGENT LARGE VESSEL OCCLUSION THROMBOLYSIS (anterior CIRCULATION) COMPARISON:  CT angiogram of the head and neck of June 20, 2022. MEDICATIONS: Ancef 2 g IV antibiotic was administered within 1 hour of the procedure. ANESTHESIA/SEDATION: General anesthesia. CONTRAST:  Omnipaque 300 approximately 130 mL. FLUOROSCOPY TIME:  Fluoroscopy Time: 60 minutes 6 seconds (2420 mGy). COMPLICATIONS: None immediate. TECHNIQUE: Following a full explanation of the procedure along with the potential associated complications, an informed witnessed consent was obtained. The risks of intracranial hemorrhage of 10%, worsening neurological deficit, ventilator dependency, death and inability to revascularize were all reviewed in detail with the patient's spouse. The patient was then put under general anesthesia by the Department of Anesthesiology at [facility]. The right groin was prepped and draped in the usual sterile fashion. Thereafter using modified Seldinger technique, transfemoral access into the right common femoral artery was obtained without difficulty. Over a 0.035 inch guidewire a 5 French Pinnacle  sheath was inserted. Through this, and also over a 0.035 inch guidewire a 5 [place] JB 1 catheter was advanced. The resistance was encountered in the lower abdominal aortic region. 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 Also noted was occluded left common femoral artery just proximal to the bifurcation. The right forearm to the wrist was then prepped and draped in the usual manner. The right radial artery was then identified with ultrasound, and its morphology documented in the radiology PACS system. A dorsal palmar anastomosis was verified to present. Access into the right radial artery was then obtained using ultrasound guidance and a micropuncture set. A 6/7 sheath was then advanced over an 018 inch micro guidewire. The micro guidewire and the obturator were then removed. Good aspiration was obtained from the side port of the radial sheath. A cocktail of 2000 units of heparin, 2.5 mg of verapamil, and 200 mcg of nitroglycerin was then infused through the sheath without event. A right radial arteriogram was then performed. Over a 035 inch glidewire, a combination of a [place] guide catheter inside of which was a [employer] 2 5 French 125 cm support catheter was advanced through the aortic arch region, and selectively positioned in the distal left common carotid artery and then into the distal left internal carotid artery. FINDINGS:  Left common carotid arteriogram demonstrates the left external carotid artery and its major branches to be widely patent. The left internal carotid artery at the bulb demonstrates a smooth shallow plaque along the anterior wall of the bulb without evidence of ulceration. More distally, the left internal carotid artery was seen to opacify to the cranial skull base. The petrous, the cavernous and the supraclinoid left ICA demonstrate wide patency. The left posterior  communicating artery was seen to opacify the left posterior cerebral artery. The left anterior cerebral artery opacified into the capillary and venous phases. Transient cross-filling via the anterior communicating artery of the right anterior cerebral A2 segment was evident. 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 Initial attempts were using a 115 cm 5 [place] guide catheter advanced with an 021 162 cm microcatheter over an 014 inch standard intracerebral micro guidewire with a moderate J configuration to the proximal M1 segment. The micro guidewire with microcatheter was then selectively positioned in the inferior division M2 M3 region followed by the microcatheter. After confirming safe positioning of the tip of the microcatheter, a 4 mm x 40 mm Solitaire X retrieval device was deployed. The Victor guide catheter was advanced into the proximal portion of the clot. Thereafter, aspiration was applied at the hub of the [place] guide catheter for approximately 2 minutes. Thereafter, the combination of the retrieval device, the microcatheter, and the [place] guide catheter were retrieved and removed. A control arteriogram performed through the Benchmark guide catheter in the distal left internal artery demonstrate revascularization following 3 passes with the above combination. A fourth pass was then made with the advancement of a 132 6 [place] guide catheter advanced to the supraclinoid left ICA over a combination of 021 162 microcatheter over an [place] standard micro guidewire with a moderate J configuration. The microcatheter was advanced over the micro guidewire to the M2 M3 region followed by retrieval of the micro guidewire. After having confirmed safe positioning of the tip of the micro microcatheter, a 4 mm x 40 mm Solitaire X retrieval device was deployed such that the proximal portion of the device was just proximal to the occluded left M1 segment distally. The 6  [place] Catalyst guide catheter was advanced and engaged into the proximal portion of the clot. Aspiration was then applied with a [place] aspiration device at the hub of the Catalyst guide catheter for approximately 2-1/2 minutes. Thereafter, the combination of the retrieval device, the microcatheter, and the 6 [place] Catalyst guide catheter were retrieved and removed. A control arteriogram performed through diagnostic 5 French Simmons 2 catheter in the left common carotid artery demonstrated complete revascularization of the left middle cerebral artery distribution achieving a TICI 3 revascularization. The diagnostic catheter was then selectively positioned in the right common carotid artery. An arteriogram performed demonstrates the right external carotid artery and its major branches to be widely patent. The right internal carotid artery at the bulb to the cranial skull base demonstrate wide patency. The petrous, the cavernous and the supraclinoid right ICA remained patent. The right middle and the right anterior cerebral artery opacify into the capillary and venous phases. Prompt cross-filling via the anterior communicating artery of the left anterior cerebral A2 segment and distally was noted. Partial opacification of the distal left anterior cerebral A1 segment was also noted. The diagnostic catheter was removed. A wrist band was applied for hemostasis at the right radial puncture site. Distal right radial pulse was verified to be present. A 5  French closure device was then deployed for hemostasis at the right groin puncture site. Distal pulses remained Dopplerable in both feet unchanged. Patient was extubated. Upon recovery, the patient was able to maintain oxygen saturations. Pupils were 3 mm bilaterally sluggishly reactive. Patient is still demonstrated difficulty with speech, and comprehension. Moved his left side spontaneously. Was able to raise his right hand against gravity. It was then transferred to  the neuro ICU for post revascularization care. PROCEDURE: As above. 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 PLAN: As per referring MD. Electronically Signed   By: Luanne Bras M.D.   On: 06/24/2022 14:06     CT HEAD WO CONTRAST (5MM) Result Date: 06/21/2022 CLINICAL DATA:  Neuro deficit with acute stroke suspected EXAM: CT HEAD WITHOUT CONTRAST TECHNIQUE: Contiguous axial images were obtained from the base of the skull through the vertex without intravenous contrast. RADIATION DOSE REDUCTION: This exam was performed according to the departmental dose-optimization program which includes automated exposure control, adjustment of the mA and/or kV according to patient size and/or use of iterative reconstruction technique. COMPARISON:  CT from yesterday FINDINGS: Brain: Cytotoxic edema in the superficial left temporal lobe and insula. Separate area of cytotoxic edema in the parasagittal left frontal lobe anteriorly. Mild involvement at the level of the left low parietal cortex. No hemorrhage, hydrocephalus, or midline shift. Vascular: No hyperdense vessel or unexpected calcification. Skull: Normal. Negative for fracture or focal lesion. Sinuses/Orbits: No acute finding. 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 No acute hemorrhage. Electronically Signed   By: Jorje Guild M.D.   On: 06/21/2022 11:49   CT ANGIO HEAD NECK W WO CM Result Date: 06/20/2022 CLINICAL DATA:  Stroke suspected. EXAM: CT ANGIOGRAPHY HEAD AND NECK TECHNIQUE: Multidetector CT imaging of the head and neck was performed using the standard protocol during bolus administration of intravenous contrast. Multiplanar CT image reconstructions and MIPs were obtained to evaluate the vascular anatomy. Carotid stenosis  measurements (when applicable) are obtained utilizing NASCET criteria, using the distal internal carotid diameter as the denominator. RADIATION DOSE REDUCTION: This exam was performed according to the departmental dose-optimization program which includes automated exposure control, adjustment of the mA and/or kV according to patient size and/or use of iterative reconstruction technique. CONTRAST:  31m OMNIPAQUE IOHEXOL 350 MG/ML SOLN COMPARISON:  Same-day noncontrast CT head, CTA head/neck, and catheter angiogram FINDINGS: CTA NECK FINDINGS Aortic arch: There is calcified plaque in the aortic arch. The origins of the major branch vessels are patent. The subclavian arteries are patent to the level imaged. Right carotid system: The right common, internal, and external carotid arteries are patent with mixed plaque at bifurcation but no hemodynamically significant stenosis or occlusion. There is no dissection or aneurysm. Left carotid system: The left common, internal, and external carotid arteries are patent with mild plaque at the bifurcation but no hemodynamically significant stenosis or occlusion there is no dissection or aneurysm. Vertebral arteries: There is calcified plaque in the right V1 segment resulting in severe stenosis. The remainder of the right vertebral artery is patent with no other hemodynamically significant stenosis or occlusion. The left vertebral artery is patent, without hemodynamically significant stenosis or occlusion. There is no dissection or aneurysm. Skeleton: There is no acute osseous abnormality or suspicious osseous lesion. There is no visible canal hematoma. Other neck: The soft tissues of the  neck are unremarkable. Upper chest: There is emphysema in the lung apices. Review of the MIP images confirms the above findings CTA HEAD FINDINGS Anterior circulation: There is calcified plaque in the intracranial ICAs without hemodynamically significant stenosis or occlusion There has been  interval recanalization of the previously occluded left M1 segment which now appears widely patent. The distal MCA branches appear well perfused. The right M1 segment and distal branches are patent. The bilateral ACAs are patent, without proximal stenosis or occlusion. The anterior communicating artery is normal. There is no aneurysm or AVM. Posterior circulation: There is unchanged calcified plaque in the right V4 segment resulting in multifocal mild-to-moderate stenosis. The non dominant left V4 segment is diminutive after the PICA origin. The basilar artery is patent. The major cerebellar arteries appear patent. The bilateral PCAs are patent, without proximal stenosis or occlusion. Left larger than right posterior communicating arteries are identified. There is no aneurysm or AVM. Venous sinuses: Patent. Anatomic variants: None. Review of the MIP images confirms the above findings IMPRESSION: 1. Interval recanalization of the left M1 segment which now appears widely patent. The distal branch vessels now appear well perfused. 2. Otherwise, no significant interval change since the CTA head/neck from earlier the same day. 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 Electronically Signed   By: Valetta Mole M.D.   On: 06/20/2022 17:19      12-lead ECG SR All prior EKG's in EPIC reviewed with no documented atrial fibrillation  Telemetry SR, intermittently has frequent PVCs (2 morphologies), infrequent NSVTs 3-7 beats  Assessment and Plan:  1. Cryptogenic stroke The patient presents with cryptogenic stroke.   I spoke at length with the patient (as well as his wife/daughter at bedside) about monitoring for afib with either a 30 day event monitor or an implantable loop recorder.  Risks, benefits, and alteratives to implantable loop recorder were discussed with the patient today.   At this time, the  patient is very clear in their decision to proceed with implantable loop recorder.   Wound care was reviewed with the patient (keep incision clean and dry for 3 days).  Wound check follow up will be scheduled for the patient.  Please call with questions.   Kordelia Severin Dyane Dustman, PA-C 06/25/2022

## 2022-06-25 NOTE — Plan of Care (Signed)
  Problem: Education: Goal: Knowledge of disease or condition will improve Outcome: Progressing Goal: Knowledge of secondary prevention will improve (MUST DOCUMENT ALL) Outcome: Progressing Goal: Knowledge of patient specific risk factors will improve Richard Flynn N/A or DELETE if not current risk factor) Outcome: Progressing   Problem: Ischemic Stroke/TIA Tissue Perfusion: Goal: Complications of ischemic stroke/TIA will be minimized Outcome: Progressing   Problem: Coping: Goal: Will verbalize positive feelings about self Outcome: Progressing Goal: Will identify appropriate support needs Outcome: Progressing   Problem: Health Behavior/Discharge Planning: Goal: Ability to manage health-related needs will improve Outcome: Progressing Goal: Goals will be collaboratively established with patient/family Outcome: Progressing   Problem: Self-Care: Goal: Ability to participate in self-care as condition permits will improve Outcome: Progressing Goal: Verbalization of feelings and concerns over difficulty with self-care will improve Outcome: Progressing Goal: Ability to communicate needs accurately will improve Outcome: Progressing   Problem: Nutrition: Goal: Risk of aspiration will decrease Outcome: Progressing Goal: Dietary intake will improve Outcome: Progressing   Problem: Education: Goal: Ability to describe self-care measures that may prevent or decrease complications (Diabetes Survival Skills Education) will improve Outcome: Progressing Goal: Individualized Educational Video(s) Outcome: Progressing   Problem: Coping: Goal: Ability to adjust to condition or change in health will improve Outcome: Progressing   Problem: Fluid Volume: Goal: Ability to maintain a balanced intake and output will improve Outcome: Progressing   Problem: Health Behavior/Discharge Planning: Goal: Ability to identify and utilize available resources and services will improve Outcome:  Progressing Goal: Ability to manage health-related needs will improve Outcome: Progressing   Problem: Metabolic: Goal: Ability to maintain appropriate glucose levels will improve Outcome: Progressing   Problem: Nutritional: Goal: Maintenance of adequate nutrition will improve Outcome: Progressing Goal: Progress toward achieving an optimal weight will improve Outcome: Progressing   Problem: Skin Integrity: Goal: Risk for impaired skin integrity will decrease Outcome: Progressing   Problem: Education: Goal: Knowledge of General Education information will improve Description: Including pain rating scale, medication(s)/side effects and non-pharmacologic comfort measures Outcome: Progressing   Problem: Health Behavior/Discharge Planning: Goal: Ability to manage health-related needs will improve Outcome: Progressing   Problem: Clinical Measurements: Goal: Ability to maintain clinical measurements within normal limits will improve Outcome: Progressing Goal: Will remain free from infection Outcome: Progressing Goal: Diagnostic test results will improve Outcome: Progressing Goal: Respiratory complications will improve Outcome: Progressing Goal: Cardiovascular complication will be avoided Outcome: Progressing   Problem: Activity: Goal: Risk for activity intolerance will decrease Outcome: Progressing   Problem: Nutrition: Goal: Adequate nutrition will be maintained Outcome: Progressing   Problem: Coping: Goal: Level of anxiety will decrease Outcome: Progressing   Problem: Elimination: Goal: Will not experience complications related to bowel motility Outcome: Progressing Goal: Will not experience complications related to urinary retention Outcome: Progressing   Problem: Pain Managment: Goal: General experience of comfort will improve Outcome: Progressing   Problem: Safety: Goal: Ability to remain free from injury will improve Outcome: Progressing   Problem: Skin  Integrity: Goal: Risk for impaired skin integrity will decrease Outcome: Progressing

## 2022-06-25 NOTE — H&P (Signed)
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  The patient woke up in his usual state of health that morning and was making coffee with his wife.  His wife watched the patient walk into the living room when he suddenly collapsed and rolled onto the couch and was unable to speak.  He demonstrated right facial droop.  Upon arrival to the emergency department, he had a period of unresponsiveness requiring bag ventilation.  He was administered D10 for blood glucose of 62 with improvement to 154.  Code stroke called and neurology evaluated.  Stat CT head was obtained without evidence of ICH.  His last known well was at 8:30 AM.  He was in the window for tenecteplase and this was administered at 10:10 AM.  Vessel imaging revealed emergent large vessel occlusion with left MCA M1 occlusion and IR was activated.  Underwent thrombectomy by Dr. Estanislado Pandy.  After the procedure, he was extubated but had mild respiratory distress and critical care team evaluated him at the bedside.  He did not require intubation or critical care management. Stat CT of the head was performed later that afternoon due to change in neurostatus.  This was negative for any malignant edema or significant midline shift.  CT a of head and neck were then performed.  This was reviewed and negative for any emergent LVO.  Due to status of MRI incompatible pacemaker, the patient underwent follow-up CT head on 12/24.  This revealed the left M1 to be patent.  His expressive aphasia persists.  DAPT initiated and patient now on aspirin and Plavix.  Pacemaker interrogation recommended to rule out atrial fibrillation.  Potassium supplement given for hypokalemia.  2D echo reveals ejection fraction estimated at  25 to 30%.  His home Entresto and Coreg were held.  His pacemaker is not able to be interrogated therefore electrophysiology consultation was obtained and loop recorder was placed 12/28.  Sliding scale insulin use to manage type 2 diabetes mellitus and his Lipitor was continued.  Is tolerating dysphagia 3 diet with thin liquids.The patient requires inpatient physical medicine and rehabilitation evaluations and treatment secondary to dysfunction due to left MCA stroke.  Continues to vape. Nicotine patches not necessarily effective. Has been vaping "a lot" per his wife ever since quit smoking 10+ years ago.   Pt reports LBM this AM; on 2L O2 since admission- per wife was tested for Flu and COVID 1 week prior to admission and was negative- only symptoms is prolonged cough for last 2+ weeks. Thick secretions per daughter.   Still having word finding deficits, however wasn't able to talk at all on admission; yesterday only 1 word at a time and today stringing some words together.  Denies pain.    Review of Systems  Constitutional:  Negative for chills and fever.  HENT:  Negative for hearing loss and sore throat.   Eyes:  Negative for blurred vision and double vision.  Respiratory:  Negative for cough and hemoptysis.   Cardiovascular:  Negative for chest pain and palpitations.  Gastrointestinal:  Negative for abdominal pain, constipation, nausea and vomiting.  Genitourinary:  Negative for dysuria and urgency.  Neurological:  Positive for weakness. Negative for dizziness, sensory change and headaches.  Psychiatric/Behavioral:  Negative for depression. The  patient has insomnia.   All other systems reviewed and are negative.  Past Medical History:  Diagnosis Date   CAD (coronary artery disease)    a. [facility] notes suggest cardiac cath 2013 showed occluded proximal RCA with left to right collaterals, 20-40% proximal LAD, 70% distal circumflex artery.   Cardiomyopathy ([place])    Chronic systolic CHF  (congestive heart failure) (HCC)    CKD (chronic kidney disease), stage III (HCC)    Diabetes mellitus without complication (HCC)    Hyperlipidemia    Hypertension    NSVT (nonsustained ventricular tachycardia) (HCC)    mentioned in device interrogation   PAD (peripheral artery disease) ([place])     with aortoiliac occlusion (med rx per [facility])   Pancreatitis    Past Surgical History:  Procedure Laterality Date   CHOLECYSTECTOMY     ICD IMPLANT     IR ANGIO INTRA EXTRACRAN SEL COM CAROTID INNOMINATE UNI R MOD SED  06/20/2022   IR CT HEAD LTD  06/20/2022   IR PERCUTANEOUS ART THROMBECTOMY/INFUSION INTRACRANIAL INC DIAG ANGIO  06/20/2022   IR US GUIDE VASC ACCESS RIGHT  06/20/2022   LEFT HEART CATH AND CORONARY ANGIOGRAPHY N/A 06/17/2020   Procedure: LEFT HEART CATH AND CORONARY ANGIOGRAPHY;  Surgeon: Jettie Booze, MD;  Location: [place] [facility];  Service: Cardiovascular;  Laterality: N/A;   RADIOLOGY WITH ANESTHESIA N/A 06/20/2022   Procedure: IR WITH ANESTHESIA;  Surgeon: Radiologist, Medication, MD;  Location: [facility];  Service: Radiology;  Laterality: N/A;   RIGHT HEART CATH N/A 06/17/2020   Procedure: RIGHT HEART CATH;  Surgeon: Jettie Booze, MD;  Location: [place] [facility];  Service: Cardiovascular;  Laterality: N/A;   Family History  Problem Relation Age of Onset   CAD Mother    Social History:  reports that he quit smoking about 11 years ago. His smoking use included cigarettes. He has a 50.00 pack-year smoking history. He has never used smokeless tobacco. He reports current alcohol use. He reports that he does not currently use drugs. Allergies: No Known Allergies Medications Prior to Admission  Medication Sig Dispense Refill   aspirin EC 81 MG tablet Take 81 mg by mouth daily. Swallow whole.     atorvastatin (LIPITOR) 80 MG tablet TAKE ONE TABLET BY MOUTH DAILY (Patient taking differently: Take 80 mg by mouth daily.) 90 tablet 3   carvedilol  (COREG) 12.5 MG tablet TAKE ONE TABLET BY MOUTH TWICE A DAY WITH A MEAL (Patient taking differently: Take 12.5 mg by mouth 2 (two) times daily with a meal.) 180 tablet 3   empagliflozin (JARDIANCE) 10 MG TABS tablet Take 1 tablet (10 mg total) by mouth daily. 90 tablet 0   furosemide (LASIX) 20 MG tablet TAKE ONE TABLET BY MOUTH DAILY (Patient taking differently: Take 20 mg by mouth daily.) 90 tablet 3   glipiZIDE (GLUCOTROL XL) 5 MG 24 hr tablet Take 5 mg by mouth 2 (two) times daily.     guaiFENesin-dextromethorphan (ROBITUSSIN DM) 100-10 MG/5ML syrup Take 5 mLs by mouth every 4 (four) hours as needed for cough.     LANTUS SOLOSTAR 100 UNIT/ML Solostar Pen Inject 28 Units into the skin at bedtime.     metFORMIN (GLUCOPHAGE-XR) 500 MG 24 hr tablet Take 1,000 mg by mouth 2 (two) times daily.     Multiple Vitamins-Minerals (MULTIVITAMIN ADULT) CHEW Chew 1 tablet by mouth at bedtime.     nitroGLYCERIN (NITROSTAT) 0.4 MG SL tablet Place 0.4 mg  under the tongue every 5 (five) minutes as needed for chest pain.     Omega-3 Fatty Acids (FISH OIL PO) Take 1 capsule by mouth daily.     OVER THE COUNTER MEDICATION Take 1 tablet by mouth every 12 (twelve) hours as needed (cough). [place] Cold Crush tablets     sacubitril-valsartan (ENTRESTO) 24-26 MG Take 1 tablet by mouth 2 (two) times daily. 180 tablet 3      Home: Home Living Family/patient expects to be discharged to:: Private residence Living Arrangements: Spouse/significant other Available Help at Discharge: Family, Available 24 hours/day Type of Home: House Home Access: Stairs to enter Technical brewer of Steps: 3 Entrance Stairs-Rails: Left Home Layout: One level Bathroom Shower/Tub: Chiropodist: Handicapped height Bathroom Accessibility: No Home Equipment: Conservation officer, nature (2 wheels), BSC/3in1   Functional History: Prior Function Prior Level of Function : Independent/Modified Independent  Functional Status:   Mobility: Bed Mobility Overal bed mobility: Needs Assistance Bed Mobility: Supine to Sit Supine to sit: Min assist, HOB elevated General bed mobility comments: OOB in chair Transfers Overall transfer level: Needs assistance Equipment used: None Transfers: Sit to/from Stand, Bed to chair/wheelchair/BSC Sit to Stand: Min guard Bed to/from chair/wheelchair/BSC transfer type:: Step pivot Step pivot transfers: Min assist General transfer comment: min guardc for safety Ambulation/Gait Ambulation/Gait assistance: Min guard Gait Distance (Feet): 300 Feet Assistive device: None Gait Pattern/deviations: Step-through pattern, Decreased stride length, Wide base of support General Gait Details: recipriocal gait pattern without AD, no LOB, able to follow ~50% of verbal cues for direction correctly. Gait velocity: dec Gait velocity interpretation: <1.31 ft/sec, indicative of household ambulator Stairs: Yes Stairs assistance: Min guard Stair Management: Two rails, Alternating pattern, Forwards Number of Stairs: 4 General stair comments: no LOB    ADL: ADL Overall ADL's : Needs assistance/impaired Eating/Feeding: Set up, Supervision/ safety Grooming: Minimal assistance Upper Body Bathing: Minimal assistance Lower Body Bathing: Minimal assistance Upper Body Dressing : Minimal assistance Lower Body Dressing: Moderate assistance Toilet Transfer: Minimal assistance, Ambulation Toileting- Clothing Manipulation and Hygiene: Minimal assistance Functional mobility during ADLs: Minimal assistance  Cognition: Cognition Overall Cognitive Status: Difficult to assess Orientation Level: Oriented X4 Cognition Arousal/Alertness: Awake/alert Behavior During Therapy: Flat affect Overall Cognitive Status: Difficult to assess Area of Impairment: Orientation, Following commands, Safety/judgement, Awareness, Problem solving Orientation Level: Disoriented to, Time, Situation, Place Following Commands:  Follows one step commands with increased time, Follows one step commands inconsistently Safety/Judgement: Decreased awareness of safety, Decreased awareness of deficits Awareness: Emergent Problem Solving: Slow processing, Requires verbal cues, Requires tactile cues General Comments: able to use objects appropriately; able to identify correct amounts of coins with written response however was not able to verbally identitfy them; note perseveration  Physical Exam: Blood pressure 138/80, pulse 69, temperature 98 F (36.7 C), temperature source Oral, resp. rate 15, height '5\' 5"'$  (1.651 m), weight 81.4 kg, SpO2 98 %. Physical Exam Vitals and nursing note reviewed. Exam conducted with a chaperone present.  Constitutional:      General: He is not in acute distress.    Comments: Pt appears stated age; sitting up in bedside chair with wife and daughter at bedside; NAD; appropriate, word finding deficits as per neuro  HENT:     Head: Normocephalic and atraumatic.     Comments: No facial droop; tongue midline, but coated    Nose: Nose normal. No congestion.     Comments: No nasal congestion- denies as well    Mouth/Throat:     Mouth:  Mucous membranes are dry.     Pharynx: Oropharynx is clear.  Eyes:     General:        Right eye: No discharge.        Left eye: No discharge.     Extraocular Movements: Extraocular movements intact.     Pupils: Pupils are equal, round, and reactive to light.     Comments: No nystagmus B/L- difficulty not turning head with this part of exam- EOM's  Cardiovascular:     Rate and Rhythm: Normal rate and regular rhythm.     Heart sounds: Normal heart sounds. No murmur heard.    No gallop.  Pulmonary:     Effort: Pulmonary effort is normal. No respiratory distress.     Breath sounds: Normal breath sounds. No wheezing, rhonchi or rales.     Comments: Sounds good except mild reduction in air movement at bases until cough- thick productive cough- whitish/grey productive  cough- thick secretions Abdominal:     General: Bowel sounds are normal. There is no distension.     Palpations: Abdomen is soft.     Tenderness: There is no abdominal tenderness.  Musculoskeletal:        General: Normal range of motion.     Cervical back: Neck supple. No tenderness.     Comments: LUE 5/5 RUE 5-/5- just very slightly weaker than LUE- however pt denies significant weakness LLE 5/5 RLE- 5-/5- mainly in HF/KE- less so in DF/PF/EHL  Skin:    General: Skin is warm and dry.     Comments: 3 IV's in Ue's R ac- looks older L wrist and R forearm- look better  Neurological:     General: No focal deficit present.     Mental Status: He is alert and oriented to person, place, and time.     Comments: Speech clear, some word finding Word finding issues/expressive aphasia- with some single words and some 2-3 word phrases spoken- sounds better than note documented yesterday by Neuro Intact sensation to light touch in all 4 extremities as well as face  Psychiatric:        Mood and Affect: Mood normal.        Behavior: Behavior normal.        Judgment: Judgment normal.     Results for orders placed or performed during the hospital encounter of 06/20/22 (from the past 48 hour(s))  Glucose, capillary     Status: Abnormal   Collection Time: 06/23/22 12:06 PM  Result Value Ref Range   Glucose-Capillary 150 (H) 70 - 99 mg/dL    Comment: Glucose reference range applies only to samples taken after fasting for at least 8 hours.  Glucose, capillary     Status: Abnormal   Collection Time: 06/23/22  4:38 PM  Result Value Ref Range   Glucose-Capillary 177 (H) 70 - 99 mg/dL    Comment: Glucose reference range applies only to samples taken after fasting for at least 8 hours.  Glucose, capillary     Status: Abnormal   Collection Time: 06/23/22  9:38 PM  Result Value Ref Range   Glucose-Capillary 214 (H) 70 - 99 mg/dL    Comment: Glucose reference range applies only to samples taken after  fasting for at least 8 hours.  Basic metabolic panel     Status: Abnormal   Collection Time: 06/24/22 12:55 AM  Result Value Ref Range   Sodium 137 135 - 145 mmol/L   Potassium 3.4 (L) 3.5 - 5.1 mmol/L  Chloride 109 98 - 111 mmol/L   CO2 19 (L) 22 - 32 mmol/L   Glucose, Bld 217 (H) 70 - 99 mg/dL    Comment: Glucose reference range applies only to samples taken after fasting for at least 8 hours.   BUN 25 (H) 8 - 23 mg/dL   Creatinine, Ser 1.45 (H) 0.61 - 1.24 mg/dL   Calcium 8.4 (L) 8.9 - 10.3 mg/dL   GFR, Estimated 52 (L) >60 mL/min    Comment: (NOTE) Calculated using the CKD-EPI Creatinine Equation (2021)    Anion gap 9 5 - 15    Comment: Performed at [facility] [address] [place], [place] [address]  CBC     Status: Abnormal   Collection Time: 06/24/22 12:55 AM  Result Value Ref Range   WBC 13.7 (H) 4.0 - 10.5 K/uL   RBC 3.66 (L) 4.22 - 5.81 MIL/uL   Hemoglobin 11.3 (L) 13.0 - 17.0 g/dL   HCT 32.9 (L) 39.0 - 52.0 %   MCV 89.9 80.0 - 100.0 fL   MCH 30.9 26.0 - 34.0 pg   MCHC 34.3 30.0 - 36.0 g/dL   RDW 15.1 11.5 - 15.5 %   Platelets 142 (L) 150 - 400 K/uL   nRBC 0.0 0.0 - 0.2 %    Comment: Performed at [facility], [place] [address] [place], [place] [address]  Glucose, capillary     Status: Abnormal   Collection Time: 06/24/22  6:25 AM  Result Value Ref Range   Glucose-Capillary 169 (H) 70 - 99 mg/dL    Comment: Glucose reference range applies only to samples taken after fasting for at least 8 hours.  Glucose, capillary     Status: Abnormal   Collection Time: 06/24/22 12:02 PM  Result Value Ref Range   Glucose-Capillary 150 (H) 70 - 99 mg/dL    Comment: Glucose reference range applies only to samples taken after fasting for at least 8 hours.  Glucose, capillary     Status: Abnormal   Collection Time: 06/24/22  4:56 PM  Result Value Ref Range   Glucose-Capillary 247 (H) 70 - 99 mg/dL    Comment: Glucose reference range applies only to samples taken  after fasting for at least 8 hours.  Glucose, capillary     Status: Abnormal   Collection Time: 06/24/22  9:23 PM  Result Value Ref Range   Glucose-Capillary 251 (H) 70 - 99 mg/dL    Comment: Glucose reference range applies only to samples taken after fasting for at least 8 hours.  Basic metabolic panel     Status: Abnormal   Collection Time: 06/25/22  6:20 AM  Result Value Ref Range   Sodium 140 135 - 145 mmol/L   Potassium 3.6 3.5 - 5.1 mmol/L   Chloride 111 98 - 111 mmol/L   CO2 19 (L) 22 - 32 mmol/L   Glucose, Bld 143 (H) 70 - 99 mg/dL    Comment: Glucose reference range applies only to samples taken after fasting for at least 8 hours.   BUN 29 (H) 8 - 23 mg/dL   Creatinine, Ser 1.49 (H) 0.61 - 1.24 mg/dL   Calcium 8.4 (L) 8.9 - 10.3 mg/dL   GFR, Estimated 51 (L) >60 mL/min    Comment: (NOTE) Calculated using the CKD-EPI Creatinine Equation (2021)    Anion gap 10 5 - 15    Comment: Performed at [facility] [address] [place], [place] [address]  CBC     Status: Abnormal   Collection Time: 06/25/22  6:20 AM  Result Value Ref Range   WBC 9.9 4.0 - 10.5 K/uL   RBC 3.46 (L) 4.22 - 5.81 MIL/uL   Hemoglobin 10.7 (L) 13.0 - 17.0 g/dL   HCT 31.0 (L) 39.0 - 52.0 %   MCV 89.6 80.0 - 100.0 fL   MCH 30.9 26.0 - 34.0 pg   MCHC 34.5 30.0 - 36.0 g/dL   RDW 15.1 11.5 - 15.5 %   Platelets 148 (L) 150 - 400 K/uL   nRBC 0.0 0.0 - 0.2 %    Comment: Performed at [facility], [place] [address], [place], [place] [address]  Glucose, capillary     Status: Abnormal   Collection Time: 06/25/22  6:36 AM  Result Value Ref Range   Glucose-Capillary 124 (H) 70 - 99 mg/dL    Comment: Glucose reference range applies only to samples taken after fasting for at least 8 hours.  Glucose, capillary     Status: Abnormal   Collection Time: 06/25/22  9:41 AM  Result Value Ref Range   Glucose-Capillary 191 (H) 70 - 99 mg/dL    Comment: Glucose reference range applies only to samples taken  after fasting for at least 8 hours.   No results found.    Blood pressure 138/80, pulse 69, temperature 98 F (36.7 C), temperature source Oral, resp. rate 15, height '5\' 5"'$  (1.651 m), weight 81.4 kg, SpO2 98 %.  Medical Problem List and Plan: 1. Functional deficits secondary to L ACA/MCA stroke with expressive aphasia/mild R sided weakness  -patient may  shower but need to remove at least 2 of IV's.   -ELOS/Goals: 7-10 days- mod I except SLP supervision for aphasia 2.  Antithrombotics: -DVT/anticoagulation:  Pharmaceutical: Lovenox  -antiplatelet therapy: aspirin and Plavix  3. Pain Management: Tylenol  4. Mood/Behavior/Sleep: LCSW to evaluate and provide emotional support  Having Insomnia- will look into trazodone for sleep  -antipsychotic agents: n/a  5. Neuropsych/cognition: This patient is not? capable of making decisions on his own behalf.  6. Skin/Wound Care: routine skin care checks  7. Fluids/Electrolytes/Nutrition: strict Is and Os and follow-up chemistries  -carb modified diet when advanced  -dysphagia 3 diet/thin liquids  -SLP eval  8: CHF: daily weight  -home Coreg 12.5 mg BID not restarted  -home Entresto not restarted  -home Lasix 20 mg not restarted   9: Hypertension: monitor TID and prn  -home Coreg, Lasix held  10: Hyperlipidemia: continue Lipitor 80 mg daily  11: DM type 2:A1c 6.7-  CBGs q AC and q HS (no new A1c value)  -SSI  -home regimen not restarted: Jardiance 10 mg daily, glipizide 5 mg twice daily, Lantus 28 units daily, metformin 1000 mg twice daily   12: CKD 3a: serum creatinine trending upward, now 1.49; monitor BMP  13: Anemia, normocytic: multifactorial; follow-up CBC  14: Hypokalemia: now resolved; follow-up BMP  15: Insomnia: Offer trazodone as needed  16: Persistent cough, on oxygen at 2 L via [place]; afebrile, normal WBC  -Duonebs as needed  -Check/update chest x-ray  -Flutter valve, cough and deep breathe  -will wean O2 as  tolerated - of note, per wife (-) for COVID/Flu 1+ weeks ago  17: Vaping: +/- nicotine patches- also educated pt on risks of continuing to vape on risk of another stroke, esp when also has DM, CHF, CAD, HTN. And HLD 18. Getting loop today prior to admission  I have personally performed a face to face diagnostic evaluation of this patient and formulated the key components of the plan.  Additionally, I have personally reviewed laboratory data, imaging studies, as well as relevant notes and concur with the physician assistant's documentation above.   The patient's status has not changed from the original H&P.  Any changes in documentation from the acute care chart have been noted above.    Barbie Banner, PA-C 06/25/2022

## 2022-06-25 NOTE — Progress Notes (Signed)
Inpatient Rehabilitation Admission Medication Review by a Pharmacist  A complete drug regimen review was completed for this patient to identify any potential clinically significant medication issues.  High Risk Drug Classes Is patient taking? Indication by Medication  Antipsychotic Yes Compazine (PO/PR/IM) - nausea  Anticoagulant Yes Enoxaparin - VTE ppx  Antibiotic No   Opioid No   Antiplatelet Yes Clopidogrel/ASA - stroke ppx  Hypoglycemics/insulin Yes Novolog SSI - glucose control  Vasoactive Medication Yes Lasix, Coreg, Entresto - HTN  Chemotherapy No   Other Yes Robaxin - prn muscle spasms Trazodone - prn sleep Nicotine patch - smoking cessation Atorvastatin - HLD Robitussin DM - prn cough Benadryl - prn itching     Type of Medication Issue Identified Description of Issue Recommendation(s)  Drug Interaction(s) (clinically significant)     Duplicate Therapy     Allergy     No Medication Administration End Date     Incorrect Dose     Additional Drug Therapy Needed     Significant med changes from prior encounter (inform family/care partners about these prior to discharge).    Other  PTA meds not yet resumed: metformin, glipizide, lantus, jardiance Monitor CBGs and resume home meds as able    Clinically significant medication issues were identified that warrant physician communication and completion of prescribed/recommended actions by midnight of the next day:  No  Name of provider notified for urgent issues identified:   Provider Method of Notification:    Pharmacist comments:  Time spent performing this drug regimen review (minutes):  Charlotte Hall, PharmD, BCPS 06/25/2022 6:55 PM

## 2022-06-25 NOTE — Progress Notes (Signed)
Inpatient Rehab Admissions Coordinator:    I have insurance approval and a bed available for pt to admit to CIR today. Dr. Erlinda Hong in agreement.  Will let pt/family and TOC team know.   Shann Medal, PT, DPT Admissions Coordinator [phone] 06/25/22  11:49 AM

## 2022-06-25 NOTE — Discharge Instructions (Signed)
Post procedure wound care instructions Keep incision clean and dry for 3 days. You can remove outer dressing tomorrow. Leave steri-strips (little pieces of tape) on until seen in the office for wound check appointment. Call the office ([phone]) for redness, drainage, swelling, or fever.  

## 2022-06-25 NOTE — H&P (Signed)
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  The patient woke up in his usual state of health that morning and was making coffee with his wife.  His wife watched the patient walk into the living room when he suddenly collapsed and rolled onto the couch and was unable to speak.  He demonstrated right facial droop.  Upon arrival to the emergency department, he had a period of unresponsiveness requiring bag ventilation.  He was administered D10 for blood glucose of 62 with improvement to 154.  Code stroke called and neurology evaluated.  Stat CT head was obtained without evidence of ICH.  His last known well was at 8:30 AM.  He was in the window for tenecteplase and this was administered at 10:10 AM.  Vessel imaging revealed emergent large vessel occlusion with left MCA M1 occlusion and IR was activated.  Underwent thrombectomy by Dr. Estanislado Pandy.  After the procedure, he was extubated but had mild respiratory distress and critical care team evaluated him at the bedside.  He did not require intubation or critical care management. Stat CT of the head was performed later that afternoon due to change in neurostatus.  This was negative for any malignant edema or significant midline shift.  CT a of head and neck were then performed.  This was reviewed and negative for any emergent LVO.  Due to status of MRI incompatible pacemaker, the patient underwent follow-up CT head on 12/24.  This revealed the left M1 to be patent.  His expressive aphasia persists.  DAPT initiated and patient now on aspirin and Plavix.  Pacemaker interrogation recommended to rule out atrial fibrillation.  Potassium supplement given for hypokalemia.  2D echo reveals ejection fraction estimated  at 25 to 30%.  His home Entresto and Coreg were held.  His pacemaker is not able to be interrogated therefore electrophysiology consultation was obtained and loop recorder was placed 12/28.  Sliding scale insulin use to manage type 2 diabetes mellitus and his Lipitor was continued.  Is tolerating dysphagia 3 diet with thin liquids.The patient requires inpatient physical medicine and rehabilitation evaluations and treatment secondary to dysfunction due to left MCA stroke.   Continues to vape. Nicotine patches not necessarily effective. Has been vaping "a lot" per his wife ever since quit smoking 10+ years ago.    Pt reports LBM this AM; on 2L O2 since admission- per wife was tested for Flu and COVID 1 week prior to admission and was negative- only symptoms is prolonged cough for last 2+ weeks. Thick secretions per daughter.    Still having word finding deficits, however wasn't able to talk at all on admission; yesterday only 1 word at a time and today stringing some words together.  Denies pain.      Review of Systems  Constitutional:  Negative for chills and fever.  HENT:  Negative for hearing loss and sore throat.   Eyes:  Negative for blurred vision and double vision.  Respiratory:  Negative for cough and hemoptysis.   Cardiovascular:  Negative for chest pain and palpitations.  Gastrointestinal:  Negative for abdominal pain, constipation, nausea and vomiting.  Genitourinary:  Negative for dysuria and urgency.  Neurological:  Positive for weakness. Negative for dizziness, sensory change  and headaches.  Psychiatric/Behavioral:  Negative for depression. The patient has insomnia.   All other systems reviewed and are negative.       Past Medical History:  Diagnosis Date   CAD (coronary artery disease)      a. [facility] notes suggest cardiac cath 2013 showed occluded proximal RCA with left to right collaterals, 20-40% proximal LAD, 70% distal circumflex artery.   Cardiomyopathy ([place])     Chronic  systolic CHF (congestive heart failure) (HCC)     CKD (chronic kidney disease), stage III (HCC)     Diabetes mellitus without complication (HCC)     Hyperlipidemia     Hypertension     NSVT (nonsustained ventricular tachycardia) (HCC)      mentioned in device interrogation   PAD (peripheral artery disease) ([place])       with aortoiliac occlusion (med rx per [facility])   Pancreatitis           Past Surgical History:  Procedure Laterality Date   CHOLECYSTECTOMY       ICD IMPLANT       IR ANGIO INTRA EXTRACRAN SEL COM CAROTID INNOMINATE UNI R MOD SED   06/20/2022   IR CT HEAD LTD   06/20/2022   IR PERCUTANEOUS ART THROMBECTOMY/INFUSION INTRACRANIAL INC DIAG ANGIO   06/20/2022   IR US GUIDE VASC ACCESS RIGHT   06/20/2022   LEFT HEART CATH AND CORONARY ANGIOGRAPHY N/A 06/17/2020    Procedure: LEFT HEART CATH AND CORONARY ANGIOGRAPHY;  Surgeon: Jettie Booze, MD;  Location: [place];  Service: Cardiovascular;  Laterality: N/A;   RADIOLOGY WITH ANESTHESIA N/A 06/20/2022    Procedure: IR WITH ANESTHESIA;  Surgeon: Radiologist, Medication, MD;  Location: [place] [place];  Service: Radiology;  Laterality: N/A;   RIGHT HEART CATH N/A 06/17/2020    Procedure: RIGHT HEART CATH;  Surgeon: Jettie Booze, MD;  Location: [facility];  Service: Cardiovascular;  Laterality: N/A;         Family History  Problem Relation Age of Onset   CAD Mother      Social History:  reports that he quit smoking about 11 years ago. His smoking use included cigarettes. He has a 50.00 pack-year smoking history. He has never used smokeless tobacco. He reports current alcohol use. He reports that he does not currently use drugs. Allergies: No Known Allergies       Medications Prior to Admission  Medication Sig Dispense Refill   aspirin EC 81 MG tablet Take 81 mg by mouth daily. Swallow whole.       atorvastatin (LIPITOR) 80 MG tablet TAKE ONE TABLET BY MOUTH DAILY (Patient taking differently:  Take 80 mg by mouth daily.) 90 tablet 3   carvedilol (COREG) 12.5 MG tablet TAKE ONE TABLET BY MOUTH TWICE A DAY WITH A MEAL (Patient taking differently: Take 12.5 mg by mouth 2 (two) times daily with a meal.) 180 tablet 3   empagliflozin (JARDIANCE) 10 MG TABS tablet Take 1 tablet (10 mg total) by mouth daily. 90 tablet 0   furosemide (LASIX) 20 MG tablet TAKE ONE TABLET BY MOUTH DAILY (Patient taking differently: Take 20 mg by mouth daily.) 90 tablet 3   glipiZIDE (GLUCOTROL XL) 5 MG 24 hr tablet Take 5 mg by mouth 2 (two) times daily.       guaiFENesin-dextromethorphan (ROBITUSSIN DM) 100-10 MG/5ML syrup Take 5 mLs by mouth every 4 (four) hours as needed for cough.  LANTUS SOLOSTAR 100 UNIT/ML Solostar Pen Inject 28 Units into the skin at bedtime.       metFORMIN (GLUCOPHAGE-XR) 500 MG 24 hr tablet Take 1,000 mg by mouth 2 (two) times daily.       Multiple Vitamins-Minerals (MULTIVITAMIN ADULT) CHEW Chew 1 tablet by mouth at bedtime.       nitroGLYCERIN (NITROSTAT) 0.4 MG SL tablet Place 0.4 mg under the tongue every 5 (five) minutes as needed for chest pain.       Omega-3 Fatty Acids (FISH OIL PO) Take 1 capsule by mouth daily.       OVER THE COUNTER MEDICATION Take 1 tablet by mouth every 12 (twelve) hours as needed (cough). [place] Cold Crush tablets       sacubitril-valsartan (ENTRESTO) 24-26 MG Take 1 tablet by mouth 2 (two) times daily. 180 tablet 3          Home: Home Living Family/patient expects to be discharged to:: Private residence Living Arrangements: Spouse/significant other Available Help at Discharge: Family, Available 24 hours/day Type of Home: House Home Access: Stairs to enter Technical brewer of Steps: 3 Entrance Stairs-Rails: Left Home Layout: One level Bathroom Shower/Tub: Chiropodist: Handicapped height Bathroom Accessibility: No Home Equipment: Conservation officer, nature (2 wheels), BSC/3in1   Functional History: Prior Function Prior  Level of Function : Independent/Modified Independent   Functional Status:  Mobility: Bed Mobility Overal bed mobility: Needs Assistance Bed Mobility: Supine to Sit Supine to sit: Min assist, HOB elevated General bed mobility comments: OOB in chair Transfers Overall transfer level: Needs assistance Equipment used: None Transfers: Sit to/from Stand, Bed to chair/wheelchair/BSC Sit to Stand: Min guard Bed to/from chair/wheelchair/BSC transfer type:: Step pivot Step pivot transfers: Min assist General transfer comment: min guardc for safety Ambulation/Gait Ambulation/Gait assistance: Min guard Gait Distance (Feet): 300 Feet Assistive device: None Gait Pattern/deviations: Step-through pattern, Decreased stride length, Wide base of support General Gait Details: recipriocal gait pattern without AD, no LOB, able to follow ~50% of verbal cues for direction correctly. Gait velocity: dec Gait velocity interpretation: <1.31 ft/sec, indicative of household ambulator Stairs: Yes Stairs assistance: Min guard Stair Management: Two rails, Alternating pattern, Forwards Number of Stairs: 4 General stair comments: no LOB   ADL: ADL Overall ADL's : Needs assistance/impaired Eating/Feeding: Set up, Supervision/ safety Grooming: Minimal assistance Upper Body Bathing: Minimal assistance Lower Body Bathing: Minimal assistance Upper Body Dressing : Minimal assistance Lower Body Dressing: Moderate assistance Toilet Transfer: Minimal assistance, Ambulation Toileting- Clothing Manipulation and Hygiene: Minimal assistance Functional mobility during ADLs: Minimal assistance   Cognition: Cognition Overall Cognitive Status: Difficult to assess Orientation Level: Oriented X4 Cognition Arousal/Alertness: Awake/alert Behavior During Therapy: Flat affect Overall Cognitive Status: Difficult to assess Area of Impairment: Orientation, Following commands, Safety/judgement, Awareness, Problem  solving Orientation Level: Disoriented to, Time, Situation, Place Following Commands: Follows one step commands with increased time, Follows one step commands inconsistently Safety/Judgement: Decreased awareness of safety, Decreased awareness of deficits Awareness: Emergent Problem Solving: Slow processing, Requires verbal cues, Requires tactile cues General Comments: able to use objects appropriately; able to identify correct amounts of coins with written response however was not able to verbally identitfy them; note perseveration   Physical Exam: Blood pressure 138/80, pulse 69, temperature 98 F (36.7 C), temperature source Oral, resp. rate 15, height '5\' 5"'$  (1.651 m), weight 81.4 kg, SpO2 98 %. Physical Exam Vitals and nursing note reviewed. Exam conducted with a chaperone present.  Constitutional:      General: He is  not in acute distress.    Comments: Pt appears stated age; sitting up in bedside chair with wife and daughter at bedside; NAD; appropriate, word finding deficits as per neuro  HENT:     Head: Normocephalic and atraumatic.     Comments: No facial droop; tongue midline, but coated    Nose: Nose normal. No congestion.     Comments: No nasal congestion- denies as well    Mouth/Throat:     Mouth: Mucous membranes are dry.     Pharynx: Oropharynx is clear.  Eyes:     General:        Right eye: No discharge.        Left eye: No discharge.     Extraocular Movements: Extraocular movements intact.     Pupils: Pupils are equal, round, and reactive to light.     Comments: No nystagmus B/L- difficulty not turning head with this part of exam- EOM's  Cardiovascular:     Rate and Rhythm: Normal rate and regular rhythm.     Heart sounds: Normal heart sounds. No murmur heard.    No gallop.  Pulmonary:     Effort: Pulmonary effort is normal. No respiratory distress.     Breath sounds: Normal breath sounds. No wheezing, rhonchi or rales.     Comments: Sounds good except mild  reduction in air movement at bases until cough- thick productive cough- whitish/grey productive cough- thick secretions Abdominal:     General: Bowel sounds are normal. There is no distension.     Palpations: Abdomen is soft.     Tenderness: There is no abdominal tenderness.  Musculoskeletal:        General: Normal range of motion.     Cervical back: Neck supple. No tenderness.     Comments: LUE 5/5 RUE 5-/5- just very slightly weaker than LUE- however pt denies significant weakness LLE 5/5 RLE- 5-/5- mainly in HF/KE- less so in DF/PF/EHL  Skin:    General: Skin is warm and dry.     Comments: 3 IV's in Ue's R ac- looks older L wrist and R forearm- look better  Neurological:     General: No focal deficit present.     Mental Status: He is alert and oriented to person, place, and time.     Comments: Speech clear, some word finding Word finding issues/expressive aphasia- with some single words and some 2-3 word phrases spoken- sounds better than note documented yesterday by Neuro Intact sensation to light touch in all 4 extremities as well as face  Psychiatric:        Mood and Affect: Mood normal.        Behavior: Behavior normal.        Judgment: Judgment normal.        Lab Results Last 48 Hours        Results for orders placed or performed during the hospital encounter of 06/20/22 (from the past 48 hour(s))  Glucose, capillary     Status: Abnormal    Collection Time: 06/23/22 12:06 PM  Result Value Ref Range    Glucose-Capillary 150 (H) 70 - 99 mg/dL      Comment: Glucose reference range applies only to samples taken after fasting for at least 8 hours.  Glucose, capillary     Status: Abnormal    Collection Time: 06/23/22  4:38 PM  Result Value Ref Range    Glucose-Capillary 177 (H) 70 - 99 mg/dL      Comment: Glucose  reference range applies only to samples taken after fasting for at least 8 hours.  Glucose, capillary     Status: Abnormal    Collection Time: 06/23/22  9:38 PM   Result Value Ref Range    Glucose-Capillary 214 (H) 70 - 99 mg/dL      Comment: Glucose reference range applies only to samples taken after fasting for at least 8 hours.  Basic metabolic panel     Status: Abnormal    Collection Time: 06/24/22 12:55 AM  Result Value Ref Range    Sodium 137 135 - 145 mmol/L    Potassium 3.4 (L) 3.5 - 5.1 mmol/L    Chloride 109 98 - 111 mmol/L    CO2 19 (L) 22 - 32 mmol/L    Glucose, Bld 217 (H) 70 - 99 mg/dL      Comment: Glucose reference range applies only to samples taken after fasting for at least 8 hours.    BUN 25 (H) 8 - 23 mg/dL    Creatinine, Ser 1.45 (H) 0.61 - 1.24 mg/dL    Calcium 8.4 (L) 8.9 - 10.3 mg/dL    GFR, Estimated 52 (L) >60 mL/min      Comment: (NOTE) Calculated using the CKD-EPI Creatinine Equation (2021)      Anion gap 9 5 - 15      Comment: Performed at [facility] [address], [place], [place] [address]  CBC     Status: Abnormal    Collection Time: 06/24/22 12:55 AM  Result Value Ref Range    WBC 13.7 (H) 4.0 - 10.5 K/uL    RBC 3.66 (L) 4.22 - 5.81 MIL/uL    Hemoglobin 11.3 (L) 13.0 - 17.0 g/dL    HCT 32.9 (L) 39.0 - 52.0 %    MCV 89.9 80.0 - 100.0 fL    MCH 30.9 26.0 - 34.0 pg    MCHC 34.3 30.0 - 36.0 g/dL    RDW 15.1 11.5 - 15.5 %    Platelets 142 (L) 150 - 400 K/uL    nRBC 0.0 0.0 - 0.2 %      Comment: Performed at [facility], [place] [address] [place], [place] [address]  Glucose, capillary     Status: Abnormal    Collection Time: 06/24/22  6:25 AM  Result Value Ref Range    Glucose-Capillary 169 (H) 70 - 99 mg/dL      Comment: Glucose reference range applies only to samples taken after fasting for at least 8 hours.  Glucose, capillary     Status: Abnormal    Collection Time: 06/24/22 12:02 PM  Result Value Ref Range    Glucose-Capillary 150 (H) 70 - 99 mg/dL      Comment: Glucose reference range applies only to samples taken after fasting for at least 8 hours.  Glucose, capillary     Status:  Abnormal    Collection Time: 06/24/22  4:56 PM  Result Value Ref Range    Glucose-Capillary 247 (H) 70 - 99 mg/dL      Comment: Glucose reference range applies only to samples taken after fasting for at least 8 hours.  Glucose, capillary     Status: Abnormal    Collection Time: 06/24/22  9:23 PM  Result Value Ref Range    Glucose-Capillary 251 (H) 70 - 99 mg/dL      Comment: Glucose reference range applies only to samples taken after fasting for at least 8 hours.  Basic metabolic  panel     Status: Abnormal    Collection Time: 06/25/22  6:20 AM  Result Value Ref Range    Sodium 140 135 - 145 mmol/L    Potassium 3.6 3.5 - 5.1 mmol/L    Chloride 111 98 - 111 mmol/L    CO2 19 (L) 22 - 32 mmol/L    Glucose, Bld 143 (H) 70 - 99 mg/dL      Comment: Glucose reference range applies only to samples taken after fasting for at least 8 hours.    BUN 29 (H) 8 - 23 mg/dL    Creatinine, Ser 1.49 (H) 0.61 - 1.24 mg/dL    Calcium 8.4 (L) 8.9 - 10.3 mg/dL    GFR, Estimated 51 (L) >60 mL/min      Comment: (NOTE) Calculated using the CKD-EPI Creatinine Equation (2021)      Anion gap 10 5 - 15      Comment: Performed at [facility] [address], [place], [place] [address]  CBC     Status: Abnormal    Collection Time: 06/25/22  6:20 AM  Result Value Ref Range    WBC 9.9 4.0 - 10.5 K/uL    RBC 3.46 (L) 4.22 - 5.81 MIL/uL    Hemoglobin 10.7 (L) 13.0 - 17.0 g/dL    HCT 31.0 (L) 39.0 - 52.0 %    MCV 89.6 80.0 - 100.0 fL    MCH 30.9 26.0 - 34.0 pg    MCHC 34.5 30.0 - 36.0 g/dL    RDW 15.1 11.5 - 15.5 %    Platelets 148 (L) 150 - 400 K/uL    nRBC 0.0 0.0 - 0.2 %      Comment: Performed at [facility], [place] [address] [place], [place] [address]  Glucose, capillary     Status: Abnormal    Collection Time: 06/25/22  6:36 AM  Result Value Ref Range    Glucose-Capillary 124 (H) 70 - 99 mg/dL      Comment: Glucose reference range applies only to samples taken after fasting for at least 8  hours.  Glucose, capillary     Status: Abnormal    Collection Time: 06/25/22  9:41 AM  Result Value Ref Range    Glucose-Capillary 191 (H) 70 - 99 mg/dL      Comment: Glucose reference range applies only to samples taken after fasting for at least 8 hours.      Imaging Results (Last 48 hours)  No results found.         Blood pressure 138/80, pulse 69, temperature 98 F (36.7 C), temperature source Oral, resp. rate 15, height '5\' 5"'$  (1.651 m), weight 81.4 kg, SpO2 98 %.   Medical Problem List and Plan: 1. Functional deficits secondary to L ACA/MCA stroke with expressive aphasia/mild R sided weakness             -patient may  shower but need to remove at least 2 of IV's.              -ELOS/Goals: 7-10 days- mod I except SLP supervision for aphasia 2.  Antithrombotics: -DVT/anticoagulation:  Pharmaceutical: Lovenox             -antiplatelet therapy: aspirin and Plavix   3. Pain Management: Tylenol   4. Mood/Behavior/Sleep: LCSW to evaluate and provide emotional support             Having Insomnia- will look into trazodone for sleep             -  antipsychotic agents: n/a   5. Neuropsych/cognition: This patient is not? capable of making decisions on his own behalf.   6. Skin/Wound Care: routine skin care checks   7. Fluids/Electrolytes/Nutrition: strict Is and Os and follow-up chemistries             -carb modified diet when advanced             -dysphagia 3 diet/thin liquids             -SLP eval   8: CHF: daily weight             -home Coreg 12.5 mg BID not restarted             -home Entresto not restarted             -home Lasix 20 mg not restarted              9: Hypertension: monitor TID and prn             -home Coreg, Lasix held   10: Hyperlipidemia: continue Lipitor 80 mg daily   11: DM type 2:A1c 6.7-  CBGs q AC and q HS (no new A1c value)             -SSI             -home regimen not restarted: Jardiance 10 mg daily, glipizide 5 mg twice daily, Lantus 28  units daily, metformin 1000 mg twice daily    12: CKD 3a: serum creatinine trending upward, now 1.49; monitor BMP   13: Anemia, normocytic: multifactorial; follow-up CBC   14: Hypokalemia: now resolved; follow-up BMP   15: Insomnia: Offer trazodone as needed   16: Persistent cough, on oxygen at 2 L via [place]; afebrile, normal WBC             -Duonebs as needed             -Check/update chest x-ray             -Flutter valve, cough and deep breathe             -will wean O2 as tolerated - of note, per wife (-) for COVID/Flu 1+ weeks ago   17: Vaping: +/- nicotine patches- also educated pt on risks of continuing to vape on risk of another stroke, esp when also has DM, CHF, CAD, HTN. And HLD 18. Getting loop today prior to admission       I have personally performed a face to face diagnostic evaluation of this patient and formulated the key components of the plan.  Additionally, I have personally reviewed laboratory data, imaging studies, as well as relevant notes and concur with the physician assistant's documentation above.   The patient's status has not changed from the original H&P.  Any changes in documentation from the acute care chart have been noted above.     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  The patient woke up in his usual state of health that morning and was making coffee with his wife.  His wife watched the patient walk into the living room when he  suddenly collapsed and rolled onto the couch and was unable to speak.  He demonstrated right facial droop.  Upon arrival to the emergency department, he had a period of unresponsiveness requiring bag  ventilation.  He was administered D10 for blood glucose of 62 with improvement to 154.  Code stroke called and neurology evaluated.  Stat CT head was obtained without evidence of ICH.  His last known well was at 8:30 AM.  He was in the window for tenecteplase and this was administered at 10:10 AM.  Vessel imaging revealed emergent large vessel occlusion with left MCA M1 occlusion and IR was activated.  Underwent thrombectomy by Dr. Estanislado Pandy.  After the procedure, he was extubated but had mild respiratory distress and critical care team evaluated him at the bedside.  He did not require intubation or critical care management. Stat CT of the head was performed later that afternoon due to change in neurostatus.  This was negative for any malignant edema or significant midline shift.  CT a of head and neck were then performed.  This was reviewed and negative for any emergent LVO.  Due to status of MRI incompatible pacemaker, the patient underwent follow-up CT head on 12/24.  This revealed the left M1 to be patent.  His expressive aphasia persists.  DAPT initiated and patient now on aspirin and Plavix.  Pacemaker interrogation recommended to rule out atrial fibrillation.  Potassium supplement given for hypokalemia.  2D echo reveals ejection fraction estimated at 25 to 30%.  His home Entresto and Coreg were held.  His pacemaker is not able to be interrogated therefore electrophysiology consultation was obtained and loop recorder was placed 12/28.  Sliding scale insulin use to manage type 2 diabetes mellitus and his Lipitor was continued.  Is tolerating dysphagia 3 diet with thin liquids.The patient requires inpatient physical medicine and rehabilitation evaluations and treatment secondary to dysfunction due to left MCA stroke.   Continues to vape. Nicotine patches not necessarily effective. Has been vaping "a lot" per his wife ever since quit smoking 10+ years ago.    Pt reports LBM this AM; on 2L O2 since admission-  per wife was tested for Flu and COVID 1 week prior to admission and was negative- only symptoms is prolonged cough for last 2+ weeks. Thick secretions per daughter.    Still having word finding deficits, however wasn't able to talk at all on admission; yesterday only 1 word at a time and today stringing some words together.  Denies pain.      Review of Systems  Constitutional:  Negative for chills and fever.  HENT:  Negative for hearing loss and sore throat.   Eyes:  Negative for blurred vision and double vision.  Respiratory:  Negative for cough and hemoptysis.   Cardiovascular:  Negative for chest pain and palpitations.  Gastrointestinal:  Negative for abdominal pain, constipation, nausea and vomiting.  Genitourinary:  Negative for dysuria and urgency.  Neurological:  Positive for weakness. Negative for dizziness, sensory change and headaches.  Psychiatric/Behavioral:  Negative for depression. The patient has insomnia.   All other systems reviewed and are negative.       Past Medical History:  Diagnosis Date   CAD (coronary artery disease)      a. [facility] notes suggest cardiac cath 2013 showed occluded proximal RCA with left to right collaterals, 20-40% proximal LAD, 70% distal circumflex artery.   Cardiomyopathy ([place])     Chronic systolic CHF (congestive heart failure) ([place])  CKD (chronic kidney disease), stage III (HCC)     Diabetes mellitus without complication (HCC)     Hyperlipidemia     Hypertension     NSVT (nonsustained ventricular tachycardia) (HCC)      mentioned in device interrogation   PAD (peripheral artery disease) ([place])       with aortoiliac occlusion (med rx per [facility])   Pancreatitis           Past Surgical History:  Procedure Laterality Date   CHOLECYSTECTOMY       ICD IMPLANT       IR ANGIO INTRA EXTRACRAN SEL COM CAROTID INNOMINATE UNI R MOD SED   06/20/2022   IR CT HEAD LTD   06/20/2022   IR PERCUTANEOUS ART THROMBECTOMY/INFUSION  INTRACRANIAL INC DIAG ANGIO   06/20/2022   IR US GUIDE VASC ACCESS RIGHT   06/20/2022   LEFT HEART CATH AND CORONARY ANGIOGRAPHY N/A 06/17/2020    Procedure: LEFT HEART CATH AND CORONARY ANGIOGRAPHY;  Surgeon: Jettie Booze, MD;  Location: [facility];  Service: Cardiovascular;  Laterality: N/A;   RADIOLOGY WITH ANESTHESIA N/A 06/20/2022    Procedure: IR WITH ANESTHESIA;  Surgeon: Radiologist, Medication, MD;  Location: [facility];  Service: Radiology;  Laterality: N/A;   RIGHT HEART CATH N/A 06/17/2020    Procedure: RIGHT HEART CATH;  Surgeon: Jettie Booze, MD;  Location: [facility];  Service: Cardiovascular;  Laterality: N/A;         Family History  Problem Relation Age of Onset   CAD Mother      Social History:  reports that he quit smoking about 11 years ago. His smoking use included cigarettes. He has a 50.00 pack-year smoking history. He has never used smokeless tobacco. He reports current alcohol use. He reports that he does not currently use drugs. Allergies: No Known Allergies       Medications Prior to Admission  Medication Sig Dispense Refill   aspirin EC 81 MG tablet Take 81 mg by mouth daily. Swallow whole.       atorvastatin (LIPITOR) 80 MG tablet TAKE ONE TABLET BY MOUTH DAILY (Patient taking differently: Take 80 mg by mouth daily.) 90 tablet 3   carvedilol (COREG) 12.5 MG tablet TAKE ONE TABLET BY MOUTH TWICE A DAY WITH A MEAL (Patient taking differently: Take 12.5 mg by mouth 2 (two) times daily with a meal.) 180 tablet 3   empagliflozin (JARDIANCE) 10 MG TABS tablet Take 1 tablet (10 mg total) by mouth daily. 90 tablet 0   furosemide (LASIX) 20 MG tablet TAKE ONE TABLET BY MOUTH DAILY (Patient taking differently: Take 20 mg by mouth daily.) 90 tablet 3   glipiZIDE (GLUCOTROL XL) 5 MG 24 hr tablet Take 5 mg by mouth 2 (two) times daily.       guaiFENesin-dextromethorphan (ROBITUSSIN DM) 100-10 MG/5ML syrup Take 5 mLs by mouth every 4 (four) hours as  needed for cough.       LANTUS SOLOSTAR 100 UNIT/ML Solostar Pen Inject 28 Units into the skin at bedtime.       metFORMIN (GLUCOPHAGE-XR) 500 MG 24 hr tablet Take 1,000 mg by mouth 2 (two) times daily.       Multiple Vitamins-Minerals (MULTIVITAMIN ADULT) CHEW Chew 1 tablet by mouth at bedtime.       nitroGLYCERIN (NITROSTAT) 0.4 MG SL tablet Place 0.4 mg under the tongue every 5 (five) minutes as needed for chest pain.  Omega-3 Fatty Acids (FISH OIL PO) Take 1 capsule by mouth daily.       OVER THE COUNTER MEDICATION Take 1 tablet by mouth every 12 (twelve) hours as needed (cough). [place] Cold Crush tablets       sacubitril-valsartan (ENTRESTO) 24-26 MG Take 1 tablet by mouth 2 (two) times daily. 180 tablet 3          Home: Home Living Family/patient expects to be discharged to:: Private residence Living Arrangements: Spouse/significant other Available Help at Discharge: Family, Available 24 hours/day Type of Home: House Home Access: Stairs to enter Technical brewer of Steps: 3 Entrance Stairs-Rails: Left Home Layout: One level Bathroom Shower/Tub: Chiropodist: Handicapped height Bathroom Accessibility: No Home Equipment: Conservation officer, nature (2 wheels), BSC/3in1   Functional History: Prior Function Prior Level of Function : Independent/Modified Independent   Functional Status:  Mobility: Bed Mobility Overal bed mobility: Needs Assistance Bed Mobility: Supine to Sit Supine to sit: Min assist, HOB elevated General bed mobility comments: OOB in chair Transfers Overall transfer level: Needs assistance Equipment used: None Transfers: Sit to/from Stand, Bed to chair/wheelchair/BSC Sit to Stand: Min guard Bed to/from chair/wheelchair/BSC transfer type:: Step pivot Step pivot transfers: Min assist General transfer comment: min guardc for safety Ambulation/Gait Ambulation/Gait assistance: Min guard Gait Distance (Feet): 300 Feet Assistive device:  None Gait Pattern/deviations: Step-through pattern, Decreased stride length, Wide base of support General Gait Details: recipriocal gait pattern without AD, no LOB, able to follow ~50% of verbal cues for direction correctly. Gait velocity: dec Gait velocity interpretation: <1.31 ft/sec, indicative of household ambulator Stairs: Yes Stairs assistance: Min guard Stair Management: Two rails, Alternating pattern, Forwards Number of Stairs: 4 General stair comments: no LOB   ADL: ADL Overall ADL's : Needs assistance/impaired Eating/Feeding: Set up, Supervision/ safety Grooming: Minimal assistance Upper Body Bathing: Minimal assistance Lower Body Bathing: Minimal assistance Upper Body Dressing : Minimal assistance Lower Body Dressing: Moderate assistance Toilet Transfer: Minimal assistance, Ambulation Toileting- Clothing Manipulation and Hygiene: Minimal assistance Functional mobility during ADLs: Minimal assistance   Cognition: Cognition Overall Cognitive Status: Difficult to assess Orientation Level: Oriented X4 Cognition Arousal/Alertness: Awake/alert Behavior During Therapy: Flat affect Overall Cognitive Status: Difficult to assess Area of Impairment: Orientation, Following commands, Safety/judgement, Awareness, Problem solving Orientation Level: Disoriented to, Time, Situation, Place Following Commands: Follows one step commands with increased time, Follows one step commands inconsistently Safety/Judgement: Decreased awareness of safety, Decreased awareness of deficits Awareness: Emergent Problem Solving: Slow processing, Requires verbal cues, Requires tactile cues General Comments: able to use objects appropriately; able to identify correct amounts of coins with written response however was not able to verbally identitfy them; note perseveration   Physical Exam: Blood pressure 138/80, pulse 69, temperature 98 F (36.7 C), temperature source Oral, resp. rate 15, height '5\' 5"'$   (1.651 m), weight 81.4 kg, SpO2 98 %. Physical Exam Vitals and nursing note reviewed. Exam conducted with a chaperone present.  Constitutional:      General: He is not in acute distress.    Comments: Pt appears stated age; sitting up in bedside chair with wife and daughter at bedside; NAD; appropriate, word finding deficits as per neuro  HENT:     Head: Normocephalic and atraumatic.     Comments: No facial droop; tongue midline, but coated    Nose: Nose normal. No congestion.     Comments: No nasal congestion- denies as well    Mouth/Throat:     Mouth: Mucous membranes are dry.  Pharynx: Oropharynx is clear.  Eyes:     General:        Right eye: No discharge.        Left eye: No discharge.     Extraocular Movements: Extraocular movements intact.     Pupils: Pupils are equal, round, and reactive to light.     Comments: No nystagmus B/L- difficulty not turning head with this part of exam- EOM's  Cardiovascular:     Rate and Rhythm: Normal rate and regular rhythm.     Heart sounds: Normal heart sounds. No murmur heard.    No gallop.  Pulmonary:     Effort: Pulmonary effort is normal. No respiratory distress.     Breath sounds: Normal breath sounds. No wheezing, rhonchi or rales.     Comments: Sounds good except mild reduction in air movement at bases until cough- thick productive cough- whitish/grey productive cough- thick secretions Abdominal:     General: Bowel sounds are normal. There is no distension.     Palpations: Abdomen is soft.     Tenderness: There is no abdominal tenderness.  Musculoskeletal:        General: Normal range of motion.     Cervical back: Neck supple. No tenderness.     Comments: LUE 5/5 RUE 5-/5- just very slightly weaker than LUE- however pt denies significant weakness LLE 5/5 RLE- 5-/5- mainly in HF/KE- less so in DF/PF/EHL  Skin:    General: Skin is warm and dry.     Comments: 3 IV's in Ue's R ac- looks older L wrist and R forearm- look better   Neurological:     General: No focal deficit present.     Mental Status: He is alert and oriented to person, place, and time.     Comments: Speech clear, some word finding Word finding issues/expressive aphasia- with some single words and some 2-3 word phrases spoken- sounds better than note documented yesterday by Neuro Intact sensation to light touch in all 4 extremities as well as face  Psychiatric:        Mood and Affect: Mood normal.        Behavior: Behavior normal.        Judgment: Judgment normal.        Lab Results Last 48 Hours        Results for orders placed or performed during the hospital encounter of 06/20/22 (from the past 48 hour(s))  Glucose, capillary     Status: Abnormal    Collection Time: 06/23/22 12:06 PM  Result Value Ref Range    Glucose-Capillary 150 (H) 70 - 99 mg/dL      Comment: Glucose reference range applies only to samples taken after fasting for at least 8 hours.  Glucose, capillary     Status: Abnormal    Collection Time: 06/23/22  4:38 PM  Result Value Ref Range    Glucose-Capillary 177 (H) 70 - 99 mg/dL      Comment: Glucose reference range applies only to samples taken after fasting for at least 8 hours.  Glucose, capillary     Status: Abnormal    Collection Time: 06/23/22  9:38 PM  Result Value Ref Range    Glucose-Capillary 214 (H) 70 - 99 mg/dL      Comment: Glucose reference range applies only to samples taken after fasting for at least 8 hours.  Basic metabolic panel     Status: Abnormal    Collection Time: 06/24/22 12:55 AM  Result Value  Ref Range    Sodium 137 135 - 145 mmol/L    Potassium 3.4 (L) 3.5 - 5.1 mmol/L    Chloride 109 98 - 111 mmol/L    CO2 19 (L) 22 - 32 mmol/L    Glucose, Bld 217 (H) 70 - 99 mg/dL      Comment: Glucose reference range applies only to samples taken after fasting for at least 8 hours.    BUN 25 (H) 8 - 23 mg/dL    Creatinine, Ser 1.45 (H) 0.61 - 1.24 mg/dL    Calcium 8.4 (L) 8.9 - 10.3 mg/dL    GFR,  Estimated 52 (L) >60 mL/min      Comment: (NOTE) Calculated using the CKD-EPI Creatinine Equation (2021)      Anion gap 9 5 - 15      Comment: Performed at [facility] [address] [place], [place] [address]  CBC     Status: Abnormal    Collection Time: 06/24/22 12:55 AM  Result Value Ref Range    WBC 13.7 (H) 4.0 - 10.5 K/uL    RBC 3.66 (L) 4.22 - 5.81 MIL/uL    Hemoglobin 11.3 (L) 13.0 - 17.0 g/dL    HCT 32.9 (L) 39.0 - 52.0 %    MCV 89.9 80.0 - 100.0 fL    MCH 30.9 26.0 - 34.0 pg    MCHC 34.3 30.0 - 36.0 g/dL    RDW 15.1 11.5 - 15.5 %    Platelets 142 (L) 150 - 400 K/uL    nRBC 0.0 0.0 - 0.2 %      Comment: Performed at [facility], [place] [address], [place], [place] [address]  Glucose, capillary     Status: Abnormal    Collection Time: 06/24/22  6:25 AM  Result Value Ref Range    Glucose-Capillary 169 (H) 70 - 99 mg/dL      Comment: Glucose reference range applies only to samples taken after fasting for at least 8 hours.  Glucose, capillary     Status: Abnormal    Collection Time: 06/24/22 12:02 PM  Result Value Ref Range    Glucose-Capillary 150 (H) 70 - 99 mg/dL      Comment: Glucose reference range applies only to samples taken after fasting for at least 8 hours.  Glucose, capillary     Status: Abnormal    Collection Time: 06/24/22  4:56 PM  Result Value Ref Range    Glucose-Capillary 247 (H) 70 - 99 mg/dL      Comment: Glucose reference range applies only to samples taken after fasting for at least 8 hours.  Glucose, capillary     Status: Abnormal    Collection Time: 06/24/22  9:23 PM  Result Value Ref Range    Glucose-Capillary 251 (H) 70 - 99 mg/dL      Comment: Glucose reference range applies only to samples taken after fasting for at least 8 hours.  Basic metabolic panel     Status: Abnormal    Collection Time: 06/25/22  6:20 AM  Result Value Ref Range    Sodium 140 135 - 145 mmol/L    Potassium 3.6 3.5 - 5.1 mmol/L    Chloride 111 98 - 111  mmol/L    CO2 19 (L) 22 - 32 mmol/L    Glucose, Bld 143 (H) 70 - 99 mg/dL      Comment: Glucose reference range applies only to samples taken after fasting for at least 8  hours.    BUN 29 (H) 8 - 23 mg/dL    Creatinine, Ser 1.49 (H) 0.61 - 1.24 mg/dL    Calcium 8.4 (L) 8.9 - 10.3 mg/dL    GFR, Estimated 51 (L) >60 mL/min      Comment: (NOTE) Calculated using the CKD-EPI Creatinine Equation (2021)      Anion gap 10 5 - 15      Comment: Performed at [facility] [address] [place], [place] [address]  CBC     Status: Abnormal    Collection Time: 06/25/22  6:20 AM  Result Value Ref Range    WBC 9.9 4.0 - 10.5 K/uL    RBC 3.46 (L) 4.22 - 5.81 MIL/uL    Hemoglobin 10.7 (L) 13.0 - 17.0 g/dL    HCT 31.0 (L) 39.0 - 52.0 %    MCV 89.6 80.0 - 100.0 fL    MCH 30.9 26.0 - 34.0 pg    MCHC 34.5 30.0 - 36.0 g/dL    RDW 15.1 11.5 - 15.5 %    Platelets 148 (L) 150 - 400 K/uL    nRBC 0.0 0.0 - 0.2 %      Comment: Performed at [facility], [place] [address], [place], [place] [address]  Glucose, capillary     Status: Abnormal    Collection Time: 06/25/22  6:36 AM  Result Value Ref Range    Glucose-Capillary 124 (H) 70 - 99 mg/dL      Comment: Glucose reference range applies only to samples taken after fasting for at least 8 hours.  Glucose, capillary     Status: Abnormal    Collection Time: 06/25/22  9:41 AM  Result Value Ref Range    Glucose-Capillary 191 (H) 70 - 99 mg/dL      Comment: Glucose reference range applies only to samples taken after fasting for at least 8 hours.      Imaging Results (Last 48 hours)  No results found.         Blood pressure 138/80, pulse 69, temperature 98 F (36.7 C), temperature source Oral, resp. rate 15, height '5\' 5"'$  (1.651 m), weight 81.4 kg, SpO2 98 %.   Medical Problem List and Plan: 1. 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              -ELOS/Goals: 7-10 days- mod I except SLP supervision for aphasia 2.  Antithrombotics: -DVT/anticoagulation:  Pharmaceutical: Lovenox             -antiplatelet therapy: aspirin and Plavix   3. Pain Management: Tylenol   4. Mood/Behavior/Sleep: LCSW to evaluate and provide emotional support             Having Insomnia- will look into trazodone for sleep             -antipsychotic agents: n/a   5. Neuropsych/cognition: This patient is not? capable of making decisions on his own behalf.   6. Skin/Wound Care: routine skin care checks   7. Fluids/Electrolytes/Nutrition: strict Is and Os and follow-up chemistries             -carb modified diet when advanced             -dysphagia 3 diet/thin liquids             -  SLP eval   8: CHF: daily weight             -home Coreg 12.5 mg BID not restarted             -home Entresto not restarted             -home Lasix 20 mg not restarted              9: Hypertension: monitor TID and prn             -home Coreg, Lasix held   10: Hyperlipidemia: continue Lipitor 80 mg daily   11: DM type 2:A1c 6.7-  CBGs q AC and q HS (no new A1c value)             -SSI             -home regimen not restarted: Jardiance 10 mg daily, glipizide 5 mg twice daily, Lantus 28 units daily, metformin 1000 mg twice daily    12: CKD 3a: serum creatinine trending upward, now 1.49; monitor BMP   13: Anemia, normocytic: multifactorial; follow-up CBC   14: Hypokalemia: now resolved; follow-up BMP   15: Insomnia: Offer trazodone as needed   16: Persistent cough, on oxygen at 2 L via [place]; afebrile, normal WBC             -Duonebs as needed             -Check/update chest x-ray             -Flutter valve, cough and deep breathe             -will wean O2 as tolerated - of note, per wife (-) for COVID/Flu 1+ weeks ago   17: Vaping: +/- nicotine patches- also educated pt on risks of continuing to vape on risk of another stroke, esp when also has  DM, CHF, CAD, HTN. And HLD 18. Getting loop today prior to admission       I have personally performed a face to face diagnostic evaluation of this patient and formulated the key components of the plan.  Additionally, I have personally reviewed laboratory data, imaging studies, as well as relevant notes and concur with the physician assistant's documentation above.   The patient's status has not changed from the original H&P.  Any changes in documentation from the acute care chart have been noted above.     Barbie Banner, PA-C 06/25/2022

## 2022-06-26 ENCOUNTER — Encounter (HOSPITAL_COMMUNITY): Payer: Self-pay | Admitting: Cardiology

## 2022-06-26 ENCOUNTER — Other Ambulatory Visit: Payer: Self-pay

## 2022-06-26 DIAGNOSIS — I639 Cerebral infarction, unspecified: Secondary | ICD-10-CM | POA: Diagnosis not present

## 2022-06-26 LAB — COMPREHENSIVE METABOLIC PANEL
ALT: 23 U/L (ref 0–44)
AST: 24 U/L (ref 15–41)
Albumin: 2.5 g/dL — ABNORMAL LOW (ref 3.5–5.0)
Alkaline Phosphatase: 68 U/L (ref 38–126)
Anion gap: 10 (ref 5–15)
BUN: 30 mg/dL — ABNORMAL HIGH (ref 8–23)
CO2: 19 mmol/L — ABNORMAL LOW (ref 22–32)
Calcium: 8.3 mg/dL — ABNORMAL LOW (ref 8.9–10.3)
Chloride: 111 mmol/L (ref 98–111)
Creatinine, Ser: 1.33 mg/dL — ABNORMAL HIGH (ref 0.61–1.24)
GFR, Estimated: 58 mL/min — ABNORMAL LOW (ref >60)
Glucose, Bld: 173 mg/dL — ABNORMAL HIGH (ref 70–99)
Potassium: 3.5 mmol/L (ref 3.5–5.1)
Sodium: 140 mmol/L (ref 135–145)
Total Bilirubin: 1.3 mg/dL — ABNORMAL HIGH (ref 0.3–1.2)
Total Protein: 6.5 g/dL (ref 6.5–8.1)

## 2022-06-26 LAB — CBC WITH DIFFERENTIAL/PLATELET
Abs Immature Granulocytes: 0.14 10*3/uL — ABNORMAL HIGH (ref 0.00–0.07)
Basophils Absolute: 0.1 10*3/uL (ref 0.0–0.1)
Basophils Relative: 1 %
Eosinophils Absolute: 0.3 10*3/uL (ref 0.0–0.5)
Eosinophils Relative: 3 %
HCT: 32.2 % — ABNORMAL LOW (ref 39.0–52.0)
Hemoglobin: 10.9 g/dL — ABNORMAL LOW (ref 13.0–17.0)
Immature Granulocytes: 2 %
Lymphocytes Relative: 18 %
Lymphs Abs: 1.6 10*3/uL (ref 0.7–4.0)
MCH: 30.2 pg (ref 26.0–34.0)
MCHC: 33.9 g/dL (ref 30.0–36.0)
MCV: 89.2 fL (ref 80.0–100.0)
Monocytes Absolute: 0.9 10*3/uL (ref 0.1–1.0)
Monocytes Relative: 10 %
Neutro Abs: 6 10*3/uL (ref 1.7–7.7)
Neutrophils Relative %: 66 %
Platelets: 186 10*3/uL (ref 150–400)
RBC: 3.61 MIL/uL — ABNORMAL LOW (ref 4.22–5.81)
RDW: 15 % (ref 11.5–15.5)
WBC: 9 10*3/uL (ref 4.0–10.5)
nRBC: 0 % (ref 0.0–0.2)

## 2022-06-26 LAB — GLUCOSE, CAPILLARY
Glucose-Capillary: 236 mg/dL — ABNORMAL HIGH (ref 70–99)
Glucose-Capillary: 174 mg/dL — ABNORMAL HIGH (ref 70–99)
Glucose-Capillary: 197 mg/dL — ABNORMAL HIGH (ref 70–99)
Glucose-Capillary: 190 mg/dL — ABNORMAL HIGH (ref 70–99)
Glucose-Capillary: 165 mg/dL — ABNORMAL HIGH (ref 70–99)

## 2022-06-26 LAB — HEMOGLOBIN A1C
Hgb A1c MFr Bld: 7.6 % — ABNORMAL HIGH (ref 4.8–5.6)
Mean Plasma Glucose: 171 mg/dL

## 2022-06-26 MED ORDER — FUROSEMIDE 20 MG PO TABS
10.0000 mg | ORAL_TABLET | Freq: Every day | ORAL | Status: DC
  Administered 2022-06-26 – 2022-07-01 (×6): 10 mg via ORAL
  Filled 2022-06-26 (×6): qty 1

## 2022-06-26 MED ORDER — SACUBITRIL-VALSARTAN 24-26 MG PO TABS
1.0000 | ORAL_TABLET | Freq: Two times a day (BID) | ORAL | Status: DC
  Administered 2022-06-26 – 2022-07-01 (×11): 1 via ORAL
  Filled 2022-06-26 (×11): qty 1

## 2022-06-26 MED ORDER — EMPAGLIFLOZIN 10 MG PO TABS
10.0000 mg | ORAL_TABLET | Freq: Every day | ORAL | Status: DC
  Administered 2022-06-26 – 2022-07-01 (×6): 10 mg via ORAL
  Filled 2022-06-26 (×6): qty 1

## 2022-06-26 NOTE — Plan of Care (Signed)
  Problem: RH Swallowing Goal: LTG Patient will consume least restrictive diet using compensatory strategies with assistance (SLP) Description: LTG:  Patient will consume least restrictive diet using compensatory strategies with assistance (SLP) Flowsheets (Taken 06/26/2022 1435) LTG: Pt Patient will consume least restrictive diet using compensatory strategies with assistance of (SLP): Modified Independent Goal: LTG Patient will participate in dysphagia therapy to increase swallow function with assistance (SLP) Description: LTG:  Patient will participate in dysphagia therapy to increase swallow function with assistance (SLP) Flowsheets (Taken 06/26/2022 1435) LTG: Pt will participate in dysphagia therapy to increase swallow function with assistance of (SLP): Modified Independent Goal: LTG Pt will demonstrate functional change in swallow as evidenced by bedside/clinical objective assessment (SLP) Description: LTG: Patient will demonstrate functional change in swallow as evidenced by bedside/clinical objective assessment (SLP) Flowsheets (Taken 06/26/2022 1435) LTG: Patient will demonstrate functional change in swallow as evidenced by bedside/clinical objective assessment: Oral swallow   Problem: RH Expression Communication Goal: LTG Patient will verbally express basic/complex needs(SLP) Description: LTG:  Patient will verbally express basic/complex needs, wants or ideas with cues  (SLP) Flowsheets (Taken 06/26/2022 1435) LTG: Patient will verbally express basic/complex needs, wants or ideas (SLP): Supervision Goal: LTG Patient will increase word finding of common (SLP) Description: LTG:  Patient will increase word finding of common objects/daily info/abstract thoughts with cues using compensatory strategies (SLP). Flowsheets (Taken 06/26/2022 1435) LTG: Patient will increase word finding of common (SLP): Supervision Patient will use compensatory strategies to increase word finding of: Daily  info   Problem: RH Memory Goal: LTG Patient will use memory compensatory aids to (SLP) Description: LTG:  Patient will use memory compensatory aids to recall biographical/new, daily complex information with cues (SLP) Flowsheets (Taken 06/26/2022 1435) LTG: Patient will use memory compensatory aids to (SLP): Supervision   Problem: RH Awareness Goal: LTG: Patient will demonstrate awareness during functional activites type of (SLP) Description: LTG: Patient will demonstrate awareness during functional activites type of (SLP) Flowsheets (Taken 06/26/2022 1435) Patient will demonstrate during cognitive/linguistic activities awareness type of: Emergent LTG: Patient will demonstrate awareness during cognitive/linguistic activities with assistance of (SLP): Supervision

## 2022-06-26 NOTE — Progress Notes (Signed)
Patient ID: Richard Flynn, male   DOB: [birth year], 66 y.o.   MRN: [id number] Met with the patient and wife to review current situation, rehab process, team conference and plan of care. Discussed secondary risk management including HTN, HLD (LDL 48/Trig 134), T2DM (A1C 6.7), CAD/PAD, CKD, HF with zone tool and smoking cessation. Reviewed loop recorder/phone management. Discussed current medications and dietary modification recommendations. Continue to follow along to discharge to address educational needs to facilitate preparation for discharge home. Margarito Liner

## 2022-06-26 NOTE — Evaluation (Signed)
Occupational Therapy Assessment and Plan  Patient Details  Name: Richard Flynn MRN: [id number] Date of Birth: 1956-01-13  OT Diagnosis: cognitive deficits and hemiplegia affecting dominant side Rehab Potential: Rehab Potential (ACUTE ONLY): Good ELOS: 7-10 days   Today's Date: 06/26/2022 OT Individual Time: 1025-8527 OT Individual Time Calculation (min): 72 min     Hospital Problem: Principal Problem:   Acute CVA (cerebrovascular accident) [facility])   Past Medical History:  Past Medical History:  Diagnosis Date   CAD (coronary artery disease)    a. [facility] notes suggest cardiac cath 2013 showed occluded proximal RCA with left to right collaterals, 20-40% proximal LAD, 70% distal circumflex artery.   Cardiomyopathy ([place])    Chronic systolic CHF (congestive heart failure) (HCC)    CKD (chronic kidney disease), stage III (HCC)    Diabetes mellitus without complication (HCC)    Hyperlipidemia    Hypertension    NSVT (nonsustained ventricular tachycardia) (HCC)    mentioned in device interrogation   PAD (peripheral artery disease) ([place])     with aortoiliac occlusion (med rx per [facility])   Pancreatitis    Past Surgical History:  Past Surgical History:  Procedure Laterality Date   CHOLECYSTECTOMY     ICD IMPLANT     IR ANGIO INTRA EXTRACRAN SEL COM CAROTID INNOMINATE UNI R MOD SED  06/20/2022   IR CT HEAD LTD  06/20/2022   IR PERCUTANEOUS ART THROMBECTOMY/INFUSION INTRACRANIAL INC DIAG ANGIO  06/20/2022   IR US GUIDE VASC ACCESS RIGHT  06/20/2022   LEFT HEART CATH AND CORONARY ANGIOGRAPHY N/A 06/17/2020   Procedure: LEFT HEART CATH AND CORONARY ANGIOGRAPHY;  Surgeon: Jettie Booze, MD;  Location: [place] [facility];  Service: Cardiovascular;  Laterality: N/A;   LOOP RECORDER INSERTION N/A 06/25/2022   Procedure: LOOP RECORDER INSERTION;  Surgeon: Vickie Epley, MD;  Location: [place] [facility];  Service: Cardiovascular;  Laterality: N/A;   RADIOLOGY WITH  ANESTHESIA N/A 06/20/2022   Procedure: IR WITH ANESTHESIA;  Surgeon: Radiologist, Medication, MD;  Location: [place];  Service: Radiology;  Laterality: N/A;   RIGHT HEART CATH N/A 06/17/2020   Procedure: RIGHT HEART CATH;  Surgeon: Jettie Booze, MD;  Location: [place] [facility];  Service: Cardiovascular;  Laterality: N/A;    Assessment & Plan Clinical Impression: Richard Flynn is a 66 year old male who presented to the emergency department on 06/20/2022 via EMS for evaluation of acute onset of right-sided weakness.  The patient woke up in his usual state of health that morning and was making coffee with his wife.  His wife watched the patient walk into the living room when he suddenly collapsed and rolled onto the couch and was unable to speak.  He demonstrated right facial droop.  Upon arrival to the emergency department, he had a period of unresponsiveness requiring bag ventilation.  He was administered D10 for blood glucose of 62 with improvement to 154.  Code stroke called and neurology evaluated.  Stat CT head was obtained without evidence of ICH.  His last known well was at 8:30 AM.  He was in the window for tenecteplase and this was administered at 10:10 AM.  Vessel imaging revealed emergent large vessel occlusion with left MCA M1 occlusion and IR was activated.  Underwent thrombectomy by Dr. Estanislado Pandy.  After the procedure, he was extubated but had mild respiratory distress and critical care team evaluated him at the bedside.  He did not require intubation or critical care management. Stat  CT of the head was performed later that afternoon due to change in neurostatus.  This was negative for any malignant edema or significant midline shift.  CT a of head and neck were then performed.  This was reviewed and negative for any emergent LVO.  Due to status of MRI incompatible pacemaker, the patient underwent follow-up CT head on 12/24.  This revealed the left M1 to be patent.  His expressive aphasia  persists.  DAPT initiated and patient now on aspirin and Plavix.  Pacemaker interrogation recommended to rule out atrial fibrillation.  Potassium supplement given for hypokalemia.  2D echo reveals ejection fraction estimated at 25 to 30%.  His home Entresto and Coreg were held.  His pacemaker is not able to be interrogated therefore electrophysiology consultation was obtained and loop recorder was placed 12/28.  Sliding scale insulin use to manage type 2 diabetes mellitus and his Lipitor was continued.  Is tolerating dysphagia 3 diet with thin liquids.The patient requires inpatient physical medicine and rehabilitation evaluations and treatment secondary to dysfunction due to left MCA stroke.   Continues to vape. Nicotine patches not necessarily effective. Has been vaping "a lot" per his wife ever since quit smoking 10+ years ago.    Pt reports LBM this AM; on 2L O2 since admission- per wife was tested for Flu and COVID 1 week prior to admission and was negative- only symptoms is prolonged cough for last 2+ weeks. Thick secretions per daughter.    Still having word finding deficits, however wasn't able to talk at all on admission; yesterday only 1 word at a time and today stringing some words together.  Denies pain.   Patient transferred to CIR on 06/25/2022 .    Patient currently requires  CGA-Min A  with basic self-care skills and IADL secondary to muscle weakness, decreased cardiorespiratoy endurance and decreased oxygen support, decreased coordination, decreased awareness, decreased problem solving, decreased safety awareness, and decreased memory, central origin, and decreased standing balance, decreased postural control, hemiplegia, and decreased balance strategies.  Prior to hospitalization, patient could complete ADLs and IADLs with independent .  Patient will benefit from skilled intervention to decrease level of assist with basic self-care skills, increase independence with basic self-care  skills, and increase level of independence with iADL prior to discharge home with care partner.  Anticipate patient will require intermittent supervision and follow up outpatient.  OT - End of Session Activity Tolerance: Decreased this session Endurance Deficit: Yes OT Assessment Rehab Potential (ACUTE ONLY): Good OT Patient demonstrates impairments in the following area(s): Cognition;Balance;Endurance;Safety;Motor OT Basic ADL's Functional Problem(s): Bathing;Dressing;Toileting OT Advanced ADL's Functional Problem(s): Simple Meal Preparation;Laundry OT Transfers Functional Problem(s): Toilet;Tub/Shower OT Plan OT Intensity: Minimum of 1-2 x/day, 45 to 90 minutes OT Frequency: 5 out of 7 days OT Duration/Estimated Length of Stay: 7-10 days OT Treatment/Interventions: Community reintegration;Balance/vestibular training;Disease mangement/prevention;Neuromuscular re-education;Patient/family education;Self Care/advanced ADL retraining;Therapeutic Exercise;UE/LE Coordination activities;Wheelchair propulsion/positioning;Cognitive remediation/compensation;Discharge planning;DME/adaptive equipment instruction;Functional mobility training;Pain management;Psychosocial support;Skin care/wound managment;Therapeutic Activities;UE/LE Strength taining/ROM OT Self Feeding Anticipated Outcome(s): Independent OT Basic Self-Care Anticipated Outcome(s): Independent OT Toileting Anticipated Outcome(s): Modified Independent OT Bathroom Transfers Anticipated Outcome(s): Supervision OT Recommendation Recommendations for Other Services: Therapeutic Recreation consult Therapeutic Recreation Interventions: Kitchen group;Pet therapy;Outing/community reintergration Patient destination: Home Follow Up Recommendations: Outpatient OT Equipment Recommended: To be determined   OT Evaluation Skilled Therapeutic Intervention/Updates:  1:1 OT evaluation and intervention initiated with skilled education provided on OT role,  goals, and POC. Pt wearing wall mounted O2 upon OT arrival and remained on  O2 support throughout OT session with stats remaining between 98-100%. Pt able to ambulate within room without AD CGA and stand to brush teeth, wash face, doff shirt, and pull up pants with CGA. Pt completed BADLs during evaluation listed below with skilled education provided on safety throughout session. Pt presenting with expressive aphasia impacting ability to assess Pt current cognitive status. Pt presenting with mild balance deficits, decreased endurance, and decrease strength of RUE impacting independence in BADLs. Wife present and interactive throughout session- asking questions and taking notes. Pt would benefit from continued OT services. Pt was left resting in recliner with wife present in room, call bell in reach, and chair alarm on.  Precautions/Restrictions  Precautions Precautions: Fall Precaution Comments: aphasic Restrictions Weight Bearing Restrictions: No General Chart Reviewed: Yes Additional Pertinent History: Hx of smoking, currently vapes; insomnia, CHF Family/Caregiver Present: Yes (wife) Vital Signs  Pain Pain Assessment Pain Scale: 0-10 Pain Score: 0-No pain Home Living/Prior Functioning Home Living Family/patient expects to be discharged to:: Private residence Living Arrangements: Spouse/significant other Available Help at Discharge: Family, Available 24 hours/day Type of Home: House Home Access: Stairs to enter Technical brewer of Steps: 7 Entrance Stairs-Rails: Left Home Layout: One level Bathroom Shower/Tub: Chiropodist: Handicapped height Bathroom Accessibility: Yes  Lives With: Spouse IADL History Homemaking Responsibilities: Yes Meal Prep Responsibility: Primary Laundry Responsibility: Secondary Cleaning Responsibility: Secondary Bill Paying/Finance Responsibility: Secondary Shopping Responsibility: Secondary Child Care Responsibility:  Secondary Homemaking Comments: Enjoyes cooking Current License: Yes Mode of Transportation: Musician Occupation: Retired 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 [place]: enjoys cooking and country music Prior Function Level of Independence: Independent with basic ADLs, Independent with homemaking with ambulation, Independent with gait, Independent with transfers  Able to Take Stairs?: Yes Driving: Yes Vocation: Retired Leisure: Hobbies-yes (Comment) (enjoys fishing) Vision Baseline Vision/History: 1 Wears glasses (reading) Ability to See in Adequate Light: 0 Adequate Patient Visual Report: No change from baseline Vision Assessment?: Yes Eye Alignment: Within Functional Limits Ocular Range of Motion: Within Functional Limits Alignment/Gaze Preference: Within Defined Limits Tracking/Visual Pursuits: Able to track stimulus in all quads without difficulty Saccades: Decreased speed of saccadic movement Convergence: Within functional limits Visual Fields: No apparent deficits (appears to be intact, will continue to assess in functional context) Additional Comments: no undershooting noted Perception  Perception: Within Functional Limits Praxis Praxis: Intact Praxis-Other Comments: appears intact; donned cothing and brushed teeth wihtout need for verbal cues Cognition Cognition Overall Cognitive Status: Impaired/Different from baseline (difficult to assess d/t aphasia and language deficits) Arousal/Alertness: Awake/alert Orientation Level: Person;Place;Situation Person: Oriented Place: Oriented Situation: Oriented Memory: Impaired Memory Impairment: Decreased recall of new information Awareness: Impaired Awareness Impairment: Emergent impairment Problem Solving: Appears intact Safety/Judgment: Appears intact Brief Interview for Mental Status (BIMS) Repetition of Three Words (First Attempt): 3 Temporal Orientation: Year: Correct Temporal Orientation:  Month: Accurate within 5 days Temporal Orientation: Day: Correct Recall: "Sock": Yes, after cueing ("something to wear") Recall: "Blue": Yes, after cueing ("a color") Recall: "Bed": Yes, no cue required BIMS Summary Score: 13 Sensation Sensation Light Touch: Appears Intact Hot/Cold: Appears Intact Proprioception: Appears Intact Stereognosis: Appears Intact Coordination Fine Motor Movements are Fluid and Coordinated: No Finger Nose Finger Test: Very mild dysmetria in RUE Motor  Motor Motor: Hemiplegia Motor - Skilled Clinical Observations: Balance deficits d/t very mild R hemiplegia  Trunk/Postural Assessment  Cervical Assessment Cervical Assessment: Within Functional Limits Thoracic Assessment Thoracic Assessment: Within Functional Limits Lumbar Assessment Lumbar Assessment: Within Functional Limits Postural  Control Postural Control: Deficits on evaluation Righting Reactions: decreased Protective Responses: decreased  Balance Balance Balance Assessed: Yes Static Sitting Balance Static Sitting - Balance Support: No upper extremity supported;Bilateral upper extremity supported Static Sitting - Level of Assistance: 6: Modified independent (Device/Increase time) Dynamic Sitting Balance Dynamic Sitting - Level of Assistance: 5: Stand by assistance Dynamic Sitting - Balance Activities:  (BADLs) Static Standing Balance Static Standing - Balance Support: No upper extremity supported Static Standing - Level of Assistance: 5: Stand by assistance Dynamic Standing Balance Dynamic Standing - Balance Support: No upper extremity supported;During functional activity Dynamic Standing - Level of Assistance: 5: Stand by assistance Extremity/Trunk Assessment RUE Assessment RUE Assessment: Within Functional Limits Passive Range of Motion (PROM) Comments: WFL Active Range of Motion (AROM) Comments: WFL General Strength Comments: Mild strength deficits 5-/5 LUE Assessment LUE Assessment:  Within Functional Limits Passive Range of Motion (PROM) Comments: WFL Active Range of Motion (AROM) Comments: WFL General Strength Comments: 5/5  Care Tool Care Tool Self Care Eating   Eating Assist Level: Set up assist    Oral Care    Oral Care Assist Level: Contact Guard/Toucning assist    Bathing   Body parts bathed by patient: Right arm;Left arm;Front perineal area;Right lower leg;Abdomen;Left upper leg;Chest;Right upper leg;Buttocks;Left lower leg;Face     Assist Level: Minimal Assistance - Patient > 75%    Upper Body Dressing(including orthotics)   What is the patient wearing?: Pull over shirt   Assist Level: Contact Guard/Touching assist    Lower Body Dressing (excluding footwear)   What is the patient wearing?: Pants Assist for lower body dressing: Minimal Assistance - Patient > 75%    Putting on/Taking off footwear   What is the patient wearing?: Socks;Shoes Assist for footwear: Minimal Assistance - Patient > 75%       Care Tool Toileting Toileting activity   Assist for toileting: Minimal Assistance - Patient > 75%     Care Tool Bed Mobility Roll left and right activity        Sit to lying activity        Lying to sitting on side of bed activity         Care Tool Transfers Sit to stand transfer   Sit to stand assist level: Minimal Assistance - Patient > 75%    Chair/bed transfer   Chair/bed transfer assist level: Minimal Assistance - Patient > 75%     Toilet transfer   Assist Level: Minimal Assistance - Patient > 75%     Care Tool Cognition  Expression of Ideas and Wants Expression of Ideas and Wants: 3. Some difficulty - exhibits some difficulty with expressing needs and ideas (e.g, some words or finishing thoughts) or speech is not clear  Understanding Verbal and Non-Verbal Content Understanding Verbal and Non-Verbal Content: 3. Usually understands - understands most conversations, but misses some part/intent of message. Requires cues at times  to understand   Memory/Recall Ability Memory/Recall Ability : Current season;Location of own room;That he or she is in a hospital/hospital unit   Refer to Care Plan for [place] 1 OT Short Term Goal 1 (Week 1): STG=LTG d/t pt length of stay  Recommendations for other services: Therapeutic Recreation  Pet therapy, Kitchen group, and Outing/community reintegration   Skilled Therapeutic Intervention ADL ADL Eating: Set up Where Assessed-Eating: Edge of bed Grooming: Contact guard Where Assessed-Grooming: Standing at sink Upper Body Bathing: Minimal assistance Where Assessed-Upper Body Bathing: Edge of  bed Lower Body Bathing: Minimal assistance Where Assessed-Lower Body Bathing: Edge of bed Upper Body Dressing: Setup Where Assessed-Upper Body Dressing: Wheelchair Lower Body Dressing: Contact guard Where Assessed-Lower Body Dressing: Wheelchair Toileting: Contact guard Where Assessed-Toileting: Glass blower/designer: Therapist, music Method: Counselling psychologist: Energy manager: Metallurgist Method: Unable to assess Social research officer, government Method: Heritage manager: Civil engineer, contracting with back Mobility  Bed Mobility Bed Mobility: Not assessed Transfers Sit to Stand: Contact Guard/Touching assist Stand to Sit: Contact Guard/Touching assist   Discharge Criteria: Patient will be discharged from OT if patient refuses treatment 3 consecutive times without medical reason, if treatment goals not met, if there is a change in medical status, if patient makes no progress towards goals or if patient is discharged from hospital.  The above assessment, treatment plan, treatment alternatives and goals were discussed and mutually agreed upon: by patient and by family  Janey Genta 06/26/2022, 3:26 PM

## 2022-06-26 NOTE — Progress Notes (Addendum)
PROGRESS NOTE   Subjective/Complaints:  No soreness at Loop site, some SOB last noc but may have been anxiety related , however CXR did show pulmonary edema, has been off CHF meds post CVA  ROS- neg CP, SOB, N/V/D Objective:   EP PPM/ICD IMPLANT  Result Date: 06/25/2022 CONCLUSIONS:  1. 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 No early apparent complications.   DG Chest 2 View  Result Date: 06/25/2022 CLINICAL DATA:  Cough EXAM: CHEST - 2 VIEW COMPARISON:  06/14/2020 FINDINGS: Left AICD remains in place, unchanged. Cardiomegaly, vascular congestion. Diffuse interstitial and airspace opacities concerning for edema. Small left effusion. No acute bony abnormality. IMPRESSION: Worsening bilateral interstitial and airspace opacities most compatible with edema/CHF. Electronically Signed   By: Rolm Baptise M.D.   On: 06/25/2022 20:26   Recent Labs    06/25/22 0620 06/26/22 0556  WBC 9.9 9.0  HGB 10.7* 10.9*  HCT 31.0* 32.2*  PLT 148* 186   Recent Labs    06/25/22 0620 06/26/22 0556  NA 140 140  K 3.6 3.5  CL 111 111  CO2 19* 19*  GLUCOSE 143* 173*  BUN 29* 30*  CREATININE 1.49* 1.33*  CALCIUM 8.4* 8.3*    Intake/Output Summary (Last 24 hours) at 06/26/2022 0908 Last data filed at 06/26/2022 0731 Gross per 24 hour  Intake --  Output 650 ml  Net -650 ml        Physical Exam: Vital Signs Blood pressure (!) 131/90, pulse 84, temperature 97.6 F (36.4 C), resp. rate 18, height '5\' 5"'$  (1.651 m), weight 80.4 kg, SpO2 100 %.   General: No acute distress Mood and affect are appropriate Heart: Regular rate and rhythm no rubs murmurs or extra sounds Lungs: Clear to auscultation, breathing unlabored, no rales or wheezes Abdomen: Positive bowel sounds, soft nontender to palpation, nondistended Extremities: No clubbing, cyanosis, or edema Skin: No evidence of breakdown, no  evidence of rash Neurologic: Cranial nerves II through XII intact, motor strength is 5/5 in bilateral deltoid, bicep, tricep, grip, hip flexor, knee extensors, ankle dorsiflexor and plantar flexor  Musculoskeletal: Full range of motion in all 4 extremities. No joint swelling   Assessment/Plan: 1. Functional deficits which require 3+ hours per day of interdisciplinary therapy in a comprehensive inpatient rehab setting. Physiatrist is providing close team supervision and 24 hour management of active medical problems listed below. Physiatrist and rehab team continue to assess barriers to discharge/monitor patient progress toward functional and medical goals  Care Tool:  Bathing              Bathing assist       Upper Body Dressing/Undressing Upper body dressing        Upper body assist      Lower Body Dressing/Undressing Lower body dressing            Lower body assist       Toileting Toileting    Toileting assist       Transfers Chair/bed transfer  Transfers assist           Locomotion Ambulation   Ambulation assist  Walk 10 feet activity   Assist           Walk 50 feet activity   Assist           Walk 150 feet activity   Assist           Walk 10 feet on uneven surface  activity   Assist           Wheelchair     Assist               Wheelchair 50 feet with 2 turns activity    Assist            Wheelchair 150 feet activity     Assist          Blood pressure (!) 131/90, pulse 84, temperature 97.6 F (36.4 C), resp. rate 18, height '5\' 5"'$  (1.651 m), weight 80.4 kg, SpO2 100 %.  Medical Problem List and Plan: 1. Functional deficits secondary to L ACA/MCA stroke with expressive aphasia/mild R sided weakness S/p loop recorder             -patient may  shower with occlusive dressing to loop site until 06/29/22             -ELOS/Goals: 7-10 days- mod I except SLP supervision  for aphasia 2.  Antithrombotics: -DVT/anticoagulation:  Pharmaceutical: Lovenox             -antiplatelet therapy: aspirin and Plavix   3. Pain Management: Tylenol   4. Mood/Behavior/Sleep: LCSW to evaluate and provide emotional support             Having Insomnia- will look into trazodone for sleep             -antipsychotic agents: n/a   5. Neuropsych/cognition: This patient is not? capable of making decisions on his own behalf.   6. Skin/Wound Care: routine skin care checks   7. Fluids/Electrolytes/Nutrition: strict Is and Os and follow-up chemistries             -carb modified diet when advanced             -dysphagia 3 diet/thin liquids             -SLP eval   8: CHF: daily weight             -home Coreg 12.5 mg BID not restarted             -home Entresto not restarted             -home Lasix 20 mg not restarted              Filed Weights   06/25/22 2057  Weight: 80.4 kg    9: Hypertension: monitor TID and prn             -home Coreg, Lasix held   Vitals:   06/26/22 0132 06/26/22 0440  BP: (!) 151/95 (!) 131/90  Pulse: 85 84  Resp: 20 18  Temp: (!) 97.5 F (36.4 C) 97.6 F (36.4 C)  SpO2: 98% 100%   Restart Entresto, lasix- monitor BP 10: Hyperlipidemia: continue Lipitor 80 mg daily   11: DM type 2:A1c 6.7-  CBGs q AC and q HS (no new A1c value)             -SSI             -home regimen not restarted: Jardiance 10 mg daily, glipizide 5 mg twice  daily, Lantus 28 units daily, metformin 1000 mg twice daily   CBG (last 3)  Recent Labs    06/25/22 1647 06/25/22 2059 06/26/22 0644  GLUCAP 236* 240* 174*  Restart Jardiance  12: CKD 3a: serum creatinine trending upward, now 1.49; monitor BMP   13: Anemia, normocytic: multifactorial; follow-up CBC   14: Hypokalemia: now resolved; follow-up BMP   15: Insomnia: Offer trazodone as needed   16: Persistent cough, on oxygen at 2 L via [place]; afebrile, normal WBC             -Duonebs as needed              -Check/update chest x-ray             -Flutter valve, cough and deep breathe             -will wean O2 as tolerated - of note, per wife (-) for COVID/Flu 1+ weeks ago   17: Vaping: +/- nicotine patches- also educated pt on risks of continuing to vape on risk of another stroke, esp when also has DM, CHF, CAD, HTN. And HLD     LOS: 1 days A FACE TO FACE EVALUATION WAS PERFORMED  Charlett Blake 06/26/2022, 9:08 AM

## 2022-06-26 NOTE — Plan of Care (Signed)
  Problem: Sit to Stand Goal: LTG:  Patient will perform sit to stand in prep for activites of daily living with assistance level (OT) Description: LTG:  Patient will perform sit to stand in prep for activites of daily living with assistance level (OT) Flowsheets (Taken 06/26/2022 1537) LTG: PT will perform sit to stand in prep for activites of daily living with assistance level: Independent   Problem: RH Bathing Goal: LTG Patient will bathe all body parts with assist levels (OT) Description: LTG: Patient will bathe all body parts with assist levels (OT) Flowsheets (Taken 06/26/2022 1537) LTG: Pt will perform bathing with assistance level/cueing: Supervision/Verbal cueing   Problem: RH Toileting Goal: LTG Patient will perform toileting task (3/3 steps) with assistance level (OT) Description: LTG: Patient will perform toileting task (3/3 steps) with assistance level (OT)  Flowsheets (Taken 06/26/2022 1537) LTG: Pt will perform toileting task (3/3 steps) with assistance level: Independent   Problem: RH Simple Meal Prep Goal: LTG Patient will perform simple meal prep w/assist (OT) Description: LTG: Patient will perform simple meal prep with assistance, with/without cues (OT). Flowsheets (Taken 06/26/2022 1537) LTG: Pt will perform simple meal prep with assistance level of: Supervision/Verbal cueing   Problem: RH Laundry Goal: LTG Patient will perform laundry w/assist, cues (OT) Description: LTG: Patient will perform laundry with assistance, with/without cues (OT). Flowsheets (Taken 06/26/2022 1537) LTG: Pt will perform laundry with assistance level of: Supervision/Verbal cueing   Problem: RH Toilet Transfers Goal: LTG Patient will perform toilet transfers w/assist (OT) Description: LTG: Patient will perform toilet transfers with assist, with/without cues using equipment (OT) Flowsheets (Taken 06/26/2022 1537) LTG: Pt will perform toilet transfers with assistance level of: Independent    Problem: RH Tub/Shower Transfers Goal: LTG Patient will perform tub/shower transfers w/assist (OT) Description: LTG: Patient will perform tub/shower transfers with assist, with/without cues using equipment (OT) Flowsheets (Taken 06/26/2022 1537) LTG: Pt will perform tub/shower stall transfers with assistance level of: Supervision/Verbal cueing   Problem: RH Awareness Goal: LTG: Patient will demonstrate awareness during functional activites type of (OT) Description: LTG: Patient will demonstrate awareness during functional activites type of (OT) Flowsheets (Taken 06/26/2022 1537) Patient will demonstrate awareness during functional activites type of: Emergent LTG: Patient will demonstrate awareness during functional activites type of (OT): Supervision   Problem: RH Balance Goal: LTG Patient will maintain dynamic standing with ADLs (OT) Description: LTG:  Patient will maintain dynamic standing balance with assist during activities of daily living (OT)  Flowsheets (Taken 06/26/2022 1537) LTG: Pt will maintain dynamic standing balance during ADLs with: Supervision/Verbal cueing

## 2022-06-26 NOTE — Progress Notes (Signed)
Inpatient Rehabilitation  Patient information reviewed and entered into eRehab system by Eyleen Rawlinson Elye Harmsen, OTR/L, Rehab Quality Coordinator.   Information including medical coding, functional ability and quality indicators will be reviewed and updated through discharge.   

## 2022-06-26 NOTE — Evaluation (Signed)
Speech Language Pathology Assessment and Plan  Patient Details  Name: Richard Flynn MRN: [id number] Date of Birth: Feb 17, 1956  SLP Diagnosis: Dysphagia;Cognitive Impairments;Aphasia  Rehab Potential: Excellent ELOS: 7-10 days    Today's Date: 06/26/2022 SLP Individual Time: 1030-1130 SLP Individual Time Calculation (min): 60 min   Hospital Problem: Principal Problem:   Acute CVA (cerebrovascular accident) [facility])  Past Medical History:  Past Medical History:  Diagnosis Date   CAD (coronary artery disease)    a. [facility] notes suggest cardiac cath 2013 showed occluded proximal RCA with left to right collaterals, 20-40% proximal LAD, 70% distal circumflex artery.   Cardiomyopathy ([place])    Chronic systolic CHF (congestive heart failure) (HCC)    CKD (chronic kidney disease), stage III (HCC)    Diabetes mellitus without complication (HCC)    Hyperlipidemia    Hypertension    NSVT (nonsustained ventricular tachycardia) (HCC)    mentioned in device interrogation   PAD (peripheral artery disease) ([place])     with aortoiliac occlusion (med rx per [facility])   Pancreatitis    Past Surgical History:  Past Surgical History:  Procedure Laterality Date   CHOLECYSTECTOMY     ICD IMPLANT     IR ANGIO INTRA EXTRACRAN SEL COM CAROTID INNOMINATE UNI R MOD SED  06/20/2022   IR CT HEAD LTD  06/20/2022   IR PERCUTANEOUS ART THROMBECTOMY/INFUSION INTRACRANIAL INC DIAG ANGIO  06/20/2022   IR US GUIDE VASC ACCESS RIGHT  06/20/2022   LEFT HEART CATH AND CORONARY ANGIOGRAPHY N/A 06/17/2020   Procedure: LEFT HEART CATH AND CORONARY ANGIOGRAPHY;  Surgeon: Jettie Booze, MD;  Location: [place] [facility];  Service: Cardiovascular;  Laterality: N/A;   LOOP RECORDER INSERTION N/A 06/25/2022   Procedure: LOOP RECORDER INSERTION;  Surgeon: Vickie Epley, MD;  Location: [place] [facility];  Service: Cardiovascular;  Laterality: N/A;   RADIOLOGY WITH ANESTHESIA N/A 06/20/2022   Procedure:  IR WITH ANESTHESIA;  Surgeon: Radiologist, Medication, MD;  Location: [place];  Service: Radiology;  Laterality: N/A;   RIGHT HEART CATH N/A 06/17/2020   Procedure: RIGHT HEART CATH;  Surgeon: Jettie Booze, MD;  Location: [place] [facility];  Service: Cardiovascular;  Laterality: N/A;    Assessment / Plan / Recommendation Clinical Impression Patient s a 66 year old male who presented to the ED on 06/20/2022 via EMS for evaluation of acute onset of right-sided weakness.  CT head was obtained without evidence of ICH.   Vessel imaging revealed emergent large vessel occlusion with left MCA M1 occlusion and IR was activated. Underwent thrombectomy by Dr. Estanislado Pandy.  Follow-up CT 12/24 acute infarcts in left MCA and ACA territory. 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  Therapy evaluations completed with recommendations for CIR. Patient admitted 06/25/22.  Upon arrival, patient was sitting upright in the recliner with increased work of breathing noted. O2 saturations 100% on room air. X-ray from 12/28 showed "edema/CHF." PA made aware of increased work of breathing during today's session. Patient consumed thin liquids via straw with a swift swallow response and without overt s/s of aspiration. Patient demonstrated slowed and deliberate mastication of solid textures with increased work of breathing. No overt s/s of aspiration. Recommend patient continue current diet at this time for energy conservation.    Patient demonstrated a mild fluent aphasia. Patient verbalized at the phrase level with decreased word-finding noted. Patient also with verbal errors with intermittent awareness and ability to self-monitor and correct. Mild deficits in auditory comprehension  noted with complex information.   Patient's overall cognitive functioning was essentially [facility] for tasks assessed, however, mild impairments in short-term recall and emergent awareness noted during informal tasks.   Patient would  benefit from skilled SLP intervention to maximize his swallowing and cognitive-linguistic functioning prior to discharge.     Skilled Therapeutic Interventions          Administered a cognitive-linguistic evaluation and BSE, please see above for details.   SLP Assessment  Patient will need skilled Speech Lanaguage Pathology Services during CIR admission    Recommendations  SLP Diet Recommendations: Dysphagia 3 (Mech soft);Thin Liquid Administration via: Cup;Straw Medication Administration: Whole meds with liquid Supervision: Patient able to self feed Compensations: Slow rate;Small sips/bites Postural Changes and/or Swallow Maneuvers: Seated upright 90 degrees Oral Care Recommendations: Oral care BID Patient destination: Home Follow up Recommendations: Outpatient SLP Equipment Recommended: None recommended by SLP    SLP Frequency 3 to 5 out of 7 days   SLP Duration  SLP Intensity  SLP Treatment/Interventions 7-10 days  Minumum of 1-2 x/day, 30 to 90 minutes  Cognitive remediation/compensation;Dysphagia/aspiration precaution training;Internal/external aids;Speech/Language facilitation;Therapeutic Activities;Environmental controls;Cueing hierarchy;Functional tasks;Patient/family education    Pain Pain Assessment Pain Scale: 0-10 Pain Score: 0-No pain  Prior Functioning Type of Home: House  Lives With: Spouse Available Help at Discharge: Family;Available 24 hours/day Vocation: Retired  Programmer, systems Overall Cognitive Status: Impaired/Different from baseline Arousal/Alertness: Awake/alert Orientation Level: Oriented X4 Memory: Impaired Memory Impairment: Decreased recall of new information Awareness: Impaired Awareness Impairment: Emergent impairment Problem Solving: Appears intact Safety/Judgment: Appears intact  Comprehension Auditory Comprehension Overall Auditory Comprehension: Impaired Yes/No Questions: Impaired Basic Biographical Questions: 76-100%  accurate Basic Immediate Environment Questions: 75-100% accurate Complex Questions: 75-100% accurate Commands: Impaired One Step Basic Commands: 75-100% accurate Two Step Basic Commands: 75-100% accurate Multistep Basic Commands: 75-100% accurate Conversation: Simple Visual Recognition/Discrimination Discrimination: Not tested Reading Comprehension Reading Status: Not tested Expression Expression Primary Mode of Expression: Verbal Verbal Expression Overall Verbal Expression: Impaired Initiation: No impairment Automatic Speech: Name;Social Response Level of Generative/Spontaneous Verbalization: Word;Phrase Repetition: Impaired Level of Impairment: Word level Naming: No impairment Pragmatics: No impairment Written Expression Dominant Hand: Right Written Expression: Not tested Oral Motor Oral Motor/Sensory Function Overall Oral Motor/Sensory Function: Mild impairment Lingual Strength: Reduced Motor Speech Overall Motor Speech: Impaired Respiration: Impaired Level of Impairment: Phrase Phonation: Low vocal intensity Articulation: Within functional limitis Intelligibility: Intelligible Motor Planning: Witnin functional limits  Care Tool Care Tool Cognition Ability to hear (with hearing aid or hearing appliances if normally used Ability to hear (with hearing aid or hearing appliances if normally used): 0. Adequate - no difficulty in normal conservation, social interaction, listening to TV   Expression of Ideas and Wants Expression of Ideas and Wants: 3. Some difficulty - exhibits some difficulty with expressing needs and ideas (e.g, some words or finishing thoughts) or speech is not clear   Understanding Verbal and Non-Verbal Content Understanding Verbal and Non-Verbal Content: 3. Usually understands - understands most conversations, but misses some part/intent of message. Requires cues at times to understand  Memory/Recall Ability Memory/Recall Ability : Current  season;Location of own room;That he or she is in a hospital/hospital unit   Bedside Swallowing Assessment General Date of Onset: 06/20/22 Previous Swallow Assessment: BSE on 12/24: Recommended Dys. 3 textures with thin liquids Diet Prior to this Study: Dysphagia 3 (soft);Thin liquids Temperature Spikes Noted: No Respiratory Status: Room air History of Recent Intubation: No Behavior/Cognition: Alert;Cooperative Oral Cavity - Dentition: Adequate natural dentition Self-Feeding  Abilities: Able to feed self Vision: Functional for self-feeding Patient Positioning: Upright in chair/Tumbleform Baseline Vocal Quality: Normal Volitional Cough: Strong Volitional Swallow: Able to elicit  Ice Chips Ice chips: Not tested Thin Liquid Thin Liquid: Within functional limits Presentation: Straw Nectar Thick Nectar Thick Liquid: Not tested Honey Thick Honey Thick Liquid: Not tested Puree Puree: Within functional limits Presentation: Spoon;Self Fed Solid Solid: Impaired Presentation: Self Fed Other Comments: slow and deliberate with mastication, increased work of breathing BSE Assessment Risk for Aspiration Impact on safety and function: Mild aspiration risk  Short Term Goals: Week 1: SLP Short Term Goal 1 (Week 1): STGs=LTGs due to ELOS  Refer to Care Plan for Long Term Goals  Recommendations for other services: None   Discharge Criteria: Patient will be discharged from SLP if patient refuses treatment 3 consecutive times without medical reason, if treatment goals not met, if there is a change in medical status, if patient makes no progress towards goals or if patient is discharged from hospital.  The above assessment, treatment plan, treatment alternatives and goals were discussed and mutually agreed upon: by patient and by family  Kameo Bains 06/26/2022, 2:37 PM

## 2022-06-26 NOTE — Evaluation (Signed)
Physical Therapy Assessment and Plan  Patient Details  Name: Richard Flynn MRN: [id number] Date of Birth: [birth year]  PT Diagnosis: Difficulty walking, Dizziness and giddiness, Hemiparesis dominant, and Muscle weakness Rehab Potential: Good ELOS: 7 days   Today's Date: 06/26/2022 PT Individual Time: 8527-7824 PT Individual Time Calculation (min): 73 min    Hospital Problem: Principal Problem:   Acute CVA (cerebrovascular accident) [facility])   Past Medical History:  Past Medical History:  Diagnosis Date   CAD (coronary artery disease)    a. [facility] notes suggest cardiac cath 2013 showed occluded proximal RCA with left to right collaterals, 20-40% proximal LAD, 70% distal circumflex artery.   Cardiomyopathy ([place])    Chronic systolic CHF (congestive heart failure) (HCC)    CKD (chronic kidney disease), stage III (HCC)    Diabetes mellitus without complication (HCC)    Hyperlipidemia    Hypertension    NSVT (nonsustained ventricular tachycardia) (HCC)    mentioned in device interrogation   PAD (peripheral artery disease) ([place])     with aortoiliac occlusion (med rx per [facility])   Pancreatitis    Past Surgical History:  Past Surgical History:  Procedure Laterality Date   CHOLECYSTECTOMY     ICD IMPLANT     IR ANGIO INTRA EXTRACRAN SEL COM CAROTID INNOMINATE UNI R MOD SED  06/20/2022   IR CT HEAD LTD  06/20/2022   IR PERCUTANEOUS ART THROMBECTOMY/INFUSION INTRACRANIAL INC DIAG ANGIO  06/20/2022   IR US GUIDE VASC ACCESS RIGHT  06/20/2022   LEFT HEART CATH AND CORONARY ANGIOGRAPHY N/A 06/17/2020   Procedure: LEFT HEART CATH AND CORONARY ANGIOGRAPHY;  Surgeon: Jettie Booze, MD;  Location: [place] [facility];  Service: Cardiovascular;  Laterality: N/A;   LOOP RECORDER INSERTION N/A 06/25/2022   Procedure: LOOP RECORDER INSERTION;  Surgeon: Vickie Epley, MD;  Location: [place] [facility];  Service: Cardiovascular;  Laterality: N/A;   RADIOLOGY WITH ANESTHESIA N/A  06/20/2022   Procedure: IR WITH ANESTHESIA;  Surgeon: Radiologist, Medication, MD;  Location: [place];  Service: Radiology;  Laterality: N/A;   RIGHT HEART CATH N/A 06/17/2020   Procedure: RIGHT HEART CATH;  Surgeon: Jettie Booze, MD;  Location: [place] [facility];  Service: Cardiovascular;  Laterality: N/A;    Assessment & Plan Clinical Impression: Patient is a 66 y.o. male who presented to the emergency department on 06/20/2022 via EMS for evaluation of acute onset of right-sided weakness.  The patient woke up in his usual state of health that morning and was making coffee with his wife.  His wife watched the patient walk into the living room when he suddenly collapsed and rolled onto the couch and was unable to speak.  He demonstrated right facial droop.  Upon arrival to the emergency department, he had a period of unresponsiveness requiring bag ventilation.  He was administered D10 for blood glucose of 62 with improvement to 154.  Code stroke called and neurology evaluated.  Stat CT head was obtained without evidence of ICH.  His last known well was at 8:30 AM.  He was in the window for tenecteplase and this was administered at 10:10 AM.  Vessel imaging revealed emergent large vessel occlusion with left MCA M1 occlusion and IR was activated.  Underwent thrombectomy by Dr. Estanislado Pandy.  After the procedure, he was extubated but had mild respiratory distress and critical care team evaluated him at the bedside.  He did not require intubation or critical care management. Stat CT of  the head was performed later that afternoon due to change in neurostatus.  This was negative for any malignant edema or significant midline shift.  CT a of head and neck were then performed.  This was reviewed and negative for any emergent LVO.  Due to status of MRI incompatible pacemaker, the patient underwent follow-up CT head on 12/24.  This revealed the left M1 to be patent.  His expressive aphasia persists.  DAPT  initiated and patient now on aspirin and Plavix.  Pacemaker interrogation recommended to rule out atrial fibrillation.  Potassium supplement given for hypokalemia.  2D echo reveals ejection fraction estimated at 25 to 30%.  His home Entresto and Coreg were held.  His pacemaker is not able to be interrogated therefore electrophysiology consultation was obtained and loop recorder was placed 12/28.  Sliding scale insulin use to manage type 2 diabetes mellitus and his Lipitor was continued.  Is tolerating dysphagia 3 diet with thin liquids.The patient requires inpatient physical medicine and rehabilitation evaluations and treatment secondary to dysfunction due to left MCA stroke.   Continues to vape. Nicotine patches not necessarily effective. Has been vaping "a lot" per his wife ever since quit smoking 10+ years ago.    Pt reports LBM this AM; on 2L O2 since admission- per wife was tested for Flu and COVID 1 week prior to admission and was negative- only symptoms is prolonged cough for last 2+ weeks. Thick secretions per daughter.    Still having word finding deficits, however wasn't able to talk at all on admission; yesterday only 1 word at a time and today stringing some words together.  Denies pain. Patient transferred to CIR on 06/25/2022 .   Patient currently requires min assist with mobility secondary to muscle weakness, decreased cardiorespiratoy endurance, ,, ,, decreased attention, decreased awareness, and decreased memory, and decreased standing balance and decreased balance strategies.  Prior to hospitalization, patient was independent  with mobility and lived with Spouse in a House home.  Home access is 7Stairs to enter.  Patient will benefit from skilled PT intervention to maximize safe functional mobility, minimize fall risk, and decrease caregiver burden for planned discharge home with 24 hour supervision.  Anticipate patient will benefit from follow up OP at discharge.  PT - End of  Session Activity Tolerance: Tolerates 30+ min activity with multiple rests Endurance Deficit: Yes Endurance Deficit Description: reduced cardiorespiratory endurance PT Assessment Rehab Potential (ACUTE/IP ONLY): Good PT Barriers to Discharge: Inaccessible home environment;Decreased caregiver support;Lack of/limited family support;Insurance for SNF coverage PT Patient demonstrates impairments in the following area(s): Balance;Endurance;Motor;Safety;Perception PT Transfers Functional Problem(s): Bed Mobility;Bed to Chair;Car;Furniture PT Locomotion Functional Problem(s): Ambulation;Stairs PT Plan PT Intensity: Minimum of 1-2 x/day ,45 to 90 minutes PT Frequency: 5 out of 7 days PT Duration Estimated Length of Stay: 7 days PT Treatment/Interventions: Ambulation/gait training;Community reintegration;DME/adaptive equipment instruction;Neuromuscular re-education;Stair training;Psychosocial support;UE/LE Strength taining/ROM;Balance/vestibular training;Discharge planning;Therapeutic Activities;UE/LE Coordination activities;Disease management/prevention;Functional mobility training;Patient/family education;Therapeutic Exercise PT Transfers Anticipated Outcome(s): Mod I PT Locomotion Anticipated Outcome(s): supervision PT Recommendation Recommendations for Other Services: Therapeutic Recreation consult Therapeutic Recreation Interventions: Pet therapy;Stress management Follow Up Recommendations: Home health PT;Outpatient PT;24 hour supervision/assistance Patient destination: Home Equipment Recommended: To be determined   PT Evaluation Precautions/Restrictions Precautions Precautions: Fall Precaution Comments: aphasic Restrictions Weight Bearing Restrictions: No General   Vital Signs Pain Pain Assessment Pain Scale: 0-10 Pain Score: 0-No pain Pain Interference Pain Interference Pain Effect on Sleep: 0. Does not apply - I have not had any pain or hurting in  the past 5 days Pain  Interference with Therapy Activities: 0. Does not apply - I have not received rehabilitationtherapy in the past 5 days Pain Interference with Day-to-Day Activities: 1. Rarely or not at all Home Living/Prior [place]: Spouse/significant other Available Help at Discharge: Family;Available 24 hours/day Type of Home: House Home Access: Stairs to enter [employer] of Steps: 7 Entrance Stairs-Rails: Left Home Layout: One level Bathroom Shower/Tub: Chiropodist: Handicapped height Bathroom Accessibility: Yes  Lives With: Spouse Prior Function Level of Independence: Independent with basic ADLs;Independent with homemaking with ambulation;Independent with gait;Independent with transfers  Able to Take Stairs?: Yes Driving: Yes Vocation: Retired Leisure: Hobbies-yes (Comment) (enjoys fishing) Vision/Perception  Vision - History Ability to See in Adequate Light: 0 Adequate Vision - Assessment Eye Alignment: Within Functional Limits Ocular Range of Motion: Within Functional Limits Alignment/Gaze Preference: Within Defined Limits Tracking/Visual Pursuits: Able to track stimulus in all quads without difficulty Perception Perception: Within Functional Limits Praxis Praxis: Intact  Cognition Overall Cognitive Status: Impaired/Different from baseline (improving expressive aphasia) Arousal/Alertness: Awake/alert Orientation Level: Oriented X4 Memory: Impaired Memory Impairment: Decreased recall of new information Awareness: Impaired Awareness Impairment: Emergent impairment Problem Solving: Appears intact Safety/Judgment: Appears intact Sensation Sensation Light Touch: Appears Intact Hot/Cold: Appears Intact Proprioception: Appears Intact Stereognosis: Appears Intact Coordination Gross Motor Movements are Fluid and Coordinated: Yes Fine Motor Movements are Fluid and Coordinated: No Finger Nose Finger Test: Very mild  dysmetria in RUE Heel Shin Test: slow but WFL Motor  Motor Motor: Other (comment) (hemipareisis) Motor - Skilled Clinical Observations: Mild balance deficits 2/2 mild R hemipareisis UE>LE   Trunk/Postural Assessment  Cervical Assessment Cervical Assessment: Within Functional Limits Thoracic Assessment Thoracic Assessment: Exceptions to [facility] (rounded shoulders) Lumbar Assessment Lumbar Assessment: Within Functional Limits Postural Control Postural Control: Deficits on evaluation (slightly decreased reaction time.) Righting Reactions: decreased Protective Responses: decreased  Balance Balance Balance Assessed: Yes Static Sitting Balance Static Sitting - Balance Support: No upper extremity supported;Feet supported Static Sitting - Level of Assistance: 6: Modified independent (Device/Increase time) Dynamic Sitting Balance Dynamic Sitting - Balance Support: No upper extremity supported;Feet supported Dynamic Sitting - Level of Assistance: 5: Stand by assistance Dynamic Sitting - Balance Activities: Forward lean/weight shifting;Lateral lean/weight shifting;Reaching for objects;Reaching across midline Static Standing Balance Static Standing - Balance Support: No upper extremity supported Static Standing - Level of Assistance: Other (comment) (CGA) Static Standing - Comment/# of Minutes: Reaches out for supprts to steady self with - mostly d/t reduced activity tolerance and decreased cardiorespiratory endurance Dynamic Standing Balance Dynamic Standing - Balance Support: During functional activity;No upper extremity supported Dynamic Standing - Level of Assistance: Other (comment) (CGA) Dynamic Standing - Balance Activities: Reaching across midline;Reaching for objects Extremity Assessment  RUE Assessment RUE Assessment: Within Functional Limits Passive Range of Motion (PROM) Comments: WFL Active Range of Motion (AROM) Comments: WFL General Strength Comments: Mild strength deficits  5-/5 LUE Assessment LUE Assessment: Within Functional Limits Passive Range of Motion (PROM) Comments: WFL Active Range of Motion (AROM) Comments: WFL General Strength Comments: 5/5 RLE Assessment RLE Assessment: Within Functional Limits LLE Assessment LLE Assessment: Within Functional Limits  Care Tool Care Tool Bed Mobility Roll left and right activity   Roll left and right assist level: Supervision/Verbal cueing    Sit to lying activity   Sit to lying assist level: Supervision/Verbal cueing    Lying to sitting on side of bed activity   Lying to sitting on side of bed assist level:  the ability to move from lying on the back to sitting on the side of the bed with no back support.: Supervision/Verbal cueing     Care Tool Transfers Sit to stand transfer   Sit to stand assist level: Contact Guard/Touching assist    Chair/bed transfer   Chair/bed transfer assist level: Contact Guard/Touching assist     Toilet transfer   Assist Level: Contact Guard/Touching assist    Car transfer   Car transfer assist level: Contact Guard/Touching assist;Minimal Assistance - Patient > 75%      Care Tool Locomotion Ambulation   Assist level: Contact Guard/Touching assist Assistive device: Walker-rolling Max distance: 275 ft  Walk 10 feet activity   Assist level: Contact Guard/Touching assist Assistive device: No Device   Walk 50 feet with 2 turns activity   Assist level: Contact Guard/Touching assist Assistive device: No Device  Walk 150 feet activity   Assist level: Contact Guard/Touching assist Assistive device: Walker-rolling  Walk 10 feet on uneven surfaces activity   Assist level: Minimal Assistance - Patient > 75%    Stairs   Assist level: Contact Guard/Touching assist Stairs assistive device: 1 hand rail Max number of stairs: 12  Walk up/down 1 step activity   Walk up/down 1 step (curb) assist level: Contact Guard/Touching assist Walk up/down 1 step or curb assistive  device: 1 hand rail  Walk up/down 4 steps activity   Walk up/down 4 steps assist level: Contact Guard/Touching assist Walk up/down 4 steps assistive device: 1 hand rail  Walk up/down 12 steps activity   Walk up/down 12 steps assist level: Contact Guard/Touching assist Walk up/down 12 steps assistive device: 1 hand rail  Pick up small objects from floor   Pick up small object from the floor assist level: Minimal Assistance - Patient > 75% Pick up small object from the floor assistive device: counter support  Wheelchair Is the patient using a wheelchair?: Yes Type of Wheelchair: Manual   Wheelchair assist level: Moderate Assistance - Patient 50 - 74% Max wheelchair distance: 50 ft  Wheel 50 feet with 2 turns activity   Assist Level: Moderate Assistance - Patient 50 - 74%  Wheel 150 feet activity   Assist Level: Maximal Assistance - Patient 25 - 49%    Refer to Care Plan for Long Term Goals  SHORT TERM GOAL WEEK 1 PT Short Term Goal 1 (Week 1): STG = LTG d/t ELOS  Recommendations for other services: Therapeutic Recreation  Pet therapy and Stress management  Skilled Therapeutic Intervention Mobility Bed Mobility Bed Mobility: Sit to Supine;Supine to Sit Supine to Sit: Supervision/Verbal cueing Sit to Supine: Supervision/Verbal cueing Transfers Transfers: Sit to Stand;Stand to Sit;Stand Pivot Transfers Sit to Stand: Contact Guard/Touching assist Stand to Sit: Contact Guard/Touching assist Stand Pivot Transfers: Contact Guard/Touching assist Transfer (Assistive device): None Locomotion  Gait Ambulation: Yes Gait Assistance: Contact Guard/Touching assist Gait Distance (Feet): 275 Feet Assistive device:  (pushing w/c) Gait Gait: Yes Gait Pattern:  (decreased B step height/ length,) Gait velocity: decreased pace Stairs / Additional Locomotion Stairs: Yes Stairs Assistance: Contact Guard/Touching assist Stair Management Technique: One rail Left Number of Stairs: 12 Height  of Stairs: 6 Ramp: Contact Guard/touching assist Wheelchair Mobility Wheelchair Mobility: Yes Wheelchair Assistance: Minimal assistance - Patient >75% Wheelchair Propulsion: Both upper extremities Wheelchair Parts Management: Needs assistance Distance: 50  Skilled Intervention: PT Evaluation completed; see above for results. PT educated patient and dtr in roles of PT vs OT, PT POC, rehab potential, rehab goals, and  discharge recommendations along with recommendation for follow-up rehabilitation services. Individual treatment initiated:  Patient seated upright in recliner upon PT arrival. Patient alert and agreeable to PT session. No pain complaint during session.  Therapeutic Activity: Bed Mobility: Patient performed supine to/from sit with use of bedrails and supervision/ CGA. Provided verbal cues for technique and requires slight increase in time to complete. Transfers: Patient performed standing transfers throughout session with overall CGA. Provided vc/tc fo improving technique to increase forward lean following forward scoot in recliner.  Car transfer performed with pt self choosing to step into footwell of car first prior to sliding into seat. Requires CGA for safety. Then exits vehicle with BLE on ground and requiring HHA at door hand strap to improve forward lean to exit car with CGA.  Gait Training:  Patient ambulated ~275 feet using w/c for BUE support. Requests to stop for reduced cardiorespiratory endurance. Pt also guided in ambulation with no AD to cover 100 feet with pt demonstrating decreased step length/ height d/t strength impairment and slight FOF. Provided vc/tc for maintaining upright posture and level gaze.  Patient seated upright in recliner with dtr present at end of session. Recliner brakes locked, no alarm set as dtr in room, and all needs within reach. Explained soft waist belt to pt and dtr in that it is not a restraint, but just a safety measure for nursing to come  provide help in the case that pt requires assist but is unable to call out for help.   Discharge Criteria: Patient will be discharged from PT if patient refuses treatment 3 consecutive times without medical reason, if treatment goals not met, if there is a change in medical status, if patient makes no progress towards goals or if patient is discharged from hospital.  The above assessment, treatment plan, treatment alternatives and goals were discussed and mutually agreed upon: by patient  Alger Simons 06/26/2022, 5:07 PM

## 2022-06-26 NOTE — Progress Notes (Signed)
Inpatient Rehabilitation Care Coordinator Assessment and Plan Patient Details  Name: Richard Flynn MRN: [id number] Date of Birth: 1956/06/27  Today's Date: 06/26/2022  Hospital Problems: Principal Problem:   Acute CVA (cerebrovascular accident) [facility])  Past Medical History:  Past Medical History:  Diagnosis Date   CAD (coronary artery disease)    a. [facility] notes suggest cardiac cath 2013 showed occluded proximal RCA with left to right collaterals, 20-40% proximal LAD, 70% distal circumflex artery.   Cardiomyopathy ([place])    Chronic systolic CHF (congestive heart failure) (HCC)    CKD (chronic kidney disease), stage III (HCC)    Diabetes mellitus without complication (HCC)    Hyperlipidemia    Hypertension    NSVT (nonsustained ventricular tachycardia) (HCC)    mentioned in device interrogation   PAD (peripheral artery disease) ([place])     with aortoiliac occlusion (med rx per [facility])   Pancreatitis    Past Surgical History:  Past Surgical History:  Procedure Laterality Date   CHOLECYSTECTOMY     ICD IMPLANT     IR ANGIO INTRA EXTRACRAN SEL COM CAROTID INNOMINATE UNI R MOD SED  06/20/2022   IR CT HEAD LTD  06/20/2022   IR PERCUTANEOUS ART THROMBECTOMY/INFUSION INTRACRANIAL INC DIAG ANGIO  06/20/2022   IR US GUIDE VASC ACCESS RIGHT  06/20/2022   LEFT HEART CATH AND CORONARY ANGIOGRAPHY N/A 06/17/2020   Procedure: LEFT HEART CATH AND CORONARY ANGIOGRAPHY;  Surgeon: Jettie Booze, MD;  Location: [place] [facility];  Service: Cardiovascular;  Laterality: N/A;   LOOP RECORDER INSERTION N/A 06/25/2022   Procedure: LOOP RECORDER INSERTION;  Surgeon: Vickie Epley, MD;  Location: [place] [facility];  Service: Cardiovascular;  Laterality: N/A;   RADIOLOGY WITH ANESTHESIA N/A 06/20/2022   Procedure: IR WITH ANESTHESIA;  Surgeon: Radiologist, Medication, MD;  Location: [place];  Service: Radiology;  Laterality: N/A;   RIGHT HEART CATH N/A 06/17/2020   Procedure: RIGHT  HEART CATH;  Surgeon: Jettie Booze, MD;  Location: [place] [facility];  Service: Cardiovascular;  Laterality: N/A;   Social History:  reports that he quit smoking about 12 years ago. His smoking use included cigarettes. He has a 50.00 pack-year smoking history. He has never used smokeless tobacco. He reports current alcohol use. He reports that he does not currently use drugs.  Family / Support Systems Anticipated Caregiver: Spouse, Research officer, political party Availability: 24/7 Family Dynamics: support from spouse and daughter  Social History Preferred language: English Religion:  Health Literacy - How often do you need to have someone help you when you read instructions, pamphlets, or other written material from your doctor or pharmacy?: Often Writes: Yes   Abuse/Neglect Abuse/Neglect Assessment Can Be Completed: Yes Physical Abuse: Denies Verbal Abuse: Denies Sexual Abuse: Denies Exploitation of patient/patient's resources: Denies Self-Neglect: Denies  Patient response to: Social Isolation - How often do you feel lonely or isolated from those around you?: Never  Emotional Status Recent Psychosocial Issues: coping  Patient / Family Perceptions, Expectations & Goals Pt/Family understanding of illness & functional limitations: yes Premorbid pt/family roles/activities: Independent Anticipated changes in roles/activities/participation: Anticipating independent goals Pt/family expectations/goals: MOD I  Recruitment consultant: None Premorbid Home Care/DME Agencies: Other (Comment) ([facility] and [facility]) Transportation available at discharge: spouse/daughter Is the patient able to respond to transportation needs?: Yes In the past 12 months, has lack of transportation kept you from medical appointments or from getting medications?: No In the past 12 months, has lack of  transportation kept you from meetings, work, or from getting things needed for daily living?:  No Resource referrals recommended: Neuropsychology  Discharge Planning Living Arrangements: Spouse/significant other Support Systems: Spouse/significant other Type of Residence: Private residence Insurance Resources: Multimedia programmer (specify) (HTA) Financial Screen Referred: Yes Money Management: Patient Does the patient have any problems obtaining your medications?: No Home Management: Independent Patient/Family Preliminary Plans: Spouse able to assist Care Coordinator Barriers to Discharge: Insurance for SNF coverage, Lack of/limited family support Care Coordinator Anticipated Follow Up Needs: HH/OP Expected length of stay: 12-14 Days  Clinical Impression Sw met with patient introduced self and explained role. Patient anticipates discharging home independently. Spouse able to assist some if needed.  No additional questions or concern.  Dyanne Iha 06/26/2022, 2:33 PM

## 2022-06-26 NOTE — Progress Notes (Signed)
[facility] Individual Statement of Services  Patient Name:  Richard Flynn  Date:  06/26/2022  Welcome to the [facility].  Our goal is to provide you with an individualized program based on your diagnosis and situation, designed to meet your specific needs.  With this comprehensive rehabilitation program, you will be expected to participate in at least 3 hours of rehabilitation therapies Monday-Friday, with modified therapy programming on the weekends.  Your rehabilitation program will include the following services:  Physical Therapy (PT), Occupational Therapy (OT), Speech Therapy (ST), 24 hour per day rehabilitation nursing, Therapeutic Recreaction (TR), Neuropsychology, Care Coordinator, Rehabilitation Medicine, Nutrition Services, Pharmacy Services, and Other  Weekly team conferences will be held on Wednesdays to discuss your progress.  Your Inpatient Rehabilitation Care Coordinator will talk with you frequently to get your input and to update you on team discussions.  Team conferences with you and your family in attendance may also be held.  Expected length of stay: 12-14 Days  Overall anticipated outcome:  MOD I  Depending on your progress and recovery, your program may change. Your Inpatient Rehabilitation Care Coordinator will coordinate services and will keep you informed of any changes. Your Inpatient Rehabilitation Care Coordinator's name and contact numbers are listed  below.  The following services may also be recommended but are not provided by the [facility]:   [facility] will be made to provide these services after discharge if needed.  Arrangements include referral to agencies that provide these services.  Your insurance has been verified to be:   HTA Your primary doctor is:  Finis Bud, MD  Pertinent information will be shared  with your doctor and your insurance company.  Inpatient Rehabilitation Care Coordinator:  Erlene Quan, [place] or [phone]  Information discussed with and copy given to patient by: Dyanne Iha, 06/26/2022, 9:58 AM

## 2022-06-27 DIAGNOSIS — I639 Cerebral infarction, unspecified: Secondary | ICD-10-CM | POA: Diagnosis not present

## 2022-06-27 LAB — GLUCOSE, CAPILLARY
Glucose-Capillary: 147 mg/dL — ABNORMAL HIGH (ref 70–99)
Glucose-Capillary: 155 mg/dL — ABNORMAL HIGH (ref 70–99)
Glucose-Capillary: 180 mg/dL — ABNORMAL HIGH (ref 70–99)
Glucose-Capillary: 199 mg/dL — ABNORMAL HIGH (ref 70–99)

## 2022-06-27 NOTE — Progress Notes (Signed)
PROGRESS NOTE   Subjective/Complaints:  Pt reports doing well- slept OK per wife- feels funny, like didn't sleep, but per wife slept entire night without waking up.   LBM yesterday About to do PT.   ROS-  Pt denies SOB, abd pain, CP, N/V/C/D, and vision changes  Objective:   EP PPM/ICD IMPLANT  Result Date: 06/25/2022 CONCLUSIONS:  1. 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 No early apparent complications.   DG Chest 2 View  Result Date: 06/25/2022 CLINICAL DATA:  Cough EXAM: CHEST - 2 VIEW COMPARISON:  06/14/2020 FINDINGS: Left AICD remains in place, unchanged. Cardiomegaly, vascular congestion. Diffuse interstitial and airspace opacities concerning for edema. Small left effusion. No acute bony abnormality. IMPRESSION: Worsening bilateral interstitial and airspace opacities most compatible with edema/CHF. Electronically Signed   By: Rolm Baptise M.D.   On: 06/25/2022 20:26   Recent Labs    06/25/22 0620 06/26/22 0556  WBC 9.9 9.0  HGB 10.7* 10.9*  HCT 31.0* 32.2*  PLT 148* 186   Recent Labs    06/25/22 0620 06/26/22 0556  NA 140 140  K 3.6 3.5  CL 111 111  CO2 19* 19*  GLUCOSE 143* 173*  BUN 29* 30*  CREATININE 1.49* 1.33*  CALCIUM 8.4* 8.3*    Intake/Output Summary (Last 24 hours) at 06/27/2022 1318 Last data filed at 06/27/2022 0900 Gross per 24 hour  Intake 180 ml  Output --  Net 180 ml        Physical Exam: Vital Signs Blood pressure 116/76, pulse 75, temperature 98.2 F (36.8 C), resp. rate 16, height '5\' 5"'$  (1.651 m), weight 80.2 kg, SpO2 100 %.     General: awake, alert, appropriate, Sitting up in bedside chair; NAD HENT: conjugate gaze; oropharynx moist CV: regular rate; no JVD Pulmonary: CTA B/L; no W/R/R- good air movement GI: soft, NT, ND, (+)BS Psychiatric: appropriate- interactive Neurological: aphasia is substantially improved Skin:  No evidence of breakdown, no evidence of rash Neurologic: Cranial nerves II through XII intact, motor strength is 5/5 in bilateral deltoid, bicep, tricep, grip, hip flexor, knee extensors, ankle dorsiflexor and plantar flexor  Musculoskeletal: Full range of motion in all 4 extremities. No joint swelling   Assessment/Plan: 1. Functional deficits which require 3+ hours per day of interdisciplinary therapy in a comprehensive inpatient rehab setting. Physiatrist is providing close team supervision and 24 hour management of active medical problems listed below. Physiatrist and rehab team continue to assess barriers to discharge/monitor patient progress toward functional and medical goals  Care Tool:  Bathing    Body parts bathed by patient: Right arm, Left arm, Front perineal area, Right lower leg, Abdomen, Left upper leg, Chest, Right upper leg, Buttocks, Left lower leg, Face         Bathing assist Assist Level: Minimal Assistance - Patient > 75%     Upper Body Dressing/Undressing Upper body dressing   What is the patient wearing?: Pull over shirt    Upper body assist Assist Level: Contact Guard/Touching assist    Lower Body Dressing/Undressing Lower body dressing      What is the patient wearing?: Pants  Lower body assist Assist for lower body dressing: Minimal Assistance - Patient > 75%     Toileting Toileting    Toileting assist Assist for toileting: Minimal Assistance - Patient > 75%     Transfers Chair/bed transfer  Transfers assist     Chair/bed transfer assist level: Contact Guard/Touching assist     Locomotion Ambulation   Ambulation assist      Assist level: Contact Guard/Touching assist Assistive device: Walker-rolling Max distance: 275 ft   Walk 10 feet activity   Assist     Assist level: Contact Guard/Touching assist Assistive device: No Device   Walk 50 feet activity   Assist    Assist level: Contact Guard/Touching  assist Assistive device: No Device    Walk 150 feet activity   Assist    Assist level: Contact Guard/Touching assist Assistive device: Walker-rolling    Walk 10 feet on uneven surface  activity   Assist     Assist level: Minimal Assistance - Patient > 75%     Wheelchair     Assist Is the patient using a wheelchair?: Yes Type of Wheelchair: Manual    Wheelchair assist level: Moderate Assistance - Patient 50 - 74% Max wheelchair distance: 50 ft    Wheelchair 50 feet with 2 turns activity    Assist        Assist Level: Moderate Assistance - Patient 50 - 74%   Wheelchair 150 feet activity     Assist      Assist Level: Maximal Assistance - Patient 25 - 49%   Blood pressure 116/76, pulse 75, temperature 98.2 F (36.8 C), resp. rate 16, height '5\' 5"'$  (1.651 m), weight 80.2 kg, SpO2 100 %.  Medical Problem List and Plan: 1. 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  Antithrombotics: -DVT/anticoagulation:  Pharmaceutical: Lovenox             -antiplatelet therapy: aspirin and Plavix   3. Pain Management: Tylenol   12/30- denies pain- con't regimen 4. Mood/Behavior/Sleep: LCSW to evaluate and provide emotional support             Having Insomnia- will look into trazodone for sleep             -antipsychotic agents: n/a   5. Neuropsych/cognition: This patient is not? capable of making decisions on his own behalf.   6. Skin/Wound Care: routine skin care checks   7. Fluids/Electrolytes/Nutrition: strict Is and Os and follow-up chemistries             -carb modified diet when advanced             -dysphagia 3 diet/thin liquids             -SLP eval   8: CHF: daily weight             -home  Coreg 12.5 mg BID not restarted             -home Entresto not restarted             -home Lasix 20 mg not restarted  12/30- weight stable Filed Weights   06/25/22 2057 06/27/22 0514  Weight: 80.4 kg 80.2 kg    9: Hypertension: monitor TID and prn             -home Coreg, Lasix held   Vitals:   06/26/22 1956 06/27/22 0514  BP: 122/80 116/76  Pulse: 81 75  Resp: 16 16  Temp: 97.6 F (36.4 C) 98.2 F (36.8 C)  SpO2: 100% 100%   Restart Entresto, lasix- monitor BP  12/30- BP controlled- con't regimen 10: Hyperlipidemia: continue Lipitor 80 mg daily   11: DM type 2:A1c 6.7-  CBGs q AC and q HS (no new A1c value)             -SSI             -home regimen not restarted: Jardiance 10 mg daily, glipizide 5 mg twice daily, Lantus 28 units daily, metformin 1000 mg twice daily   CBG (last 3)  Recent Labs    06/26/22 2036 06/27/22 0616 06/27/22 1157  GLUCAP 165* 147* 155*  Restart Jardiance  12/30- CBG's doing better- con't Jardiance, Lnatus, etc 12: CKD 3a: serum creatinine trending upward, now 1.49; monitor BMP   12/30- recheck Monday 13: Anemia, normocytic: multifactorial; follow-up CBC   14: Hypokalemia: now resolved; follow-up BMP   15: Insomnia: Offer trazodone as needed   12/30- slept well per wife- sounds like has a little hangover this AM? 16: Persistent cough, on oxygen at 2 L via [place]; afebrile, normal WBC             -Duonebs as needed             -Check/update chest x-ray             -Flutter valve, cough and deep breathe             -will wean O2 as tolerated - of note, per wife (-) for COVID/Flu 1+ weeks ago   17: Vaping: +/- nicotine patches- also educated pt on risks of continuing to vape on risk of another stroke, esp when also has DM, CHF, CAD, HTN. And HLD     LOS: 2 days A FACE TO FACE EVALUATION WAS PERFORMED  Ranferi Clingan 06/27/2022, 1:18 PM

## 2022-06-27 NOTE — Progress Notes (Signed)
Physical Therapy Session Note  Patient Details  Name: Richard Flynn MRN: [id number] Date of Birth: 1955/07/06  Today's Date: 06/27/2022 PT Individual Time: 0810-0909 PT Individual Time Calculation (min): 59 min   Short Term Goals: Week 1:  PT Short Term Goal 1 (Week 1): STG = LTG d/t ELOS  Skilled Therapeutic Interventions/Progress Updates:    Pt received sitting in recliner with his wife, [name], present as well as nurse for morning medication and MD for morning assessment. Pt agreeable to therapy session.   Sitting in recliner donned clean shirt with set-up assist. Pt agreeable to gait training to main therapy gym but requests to take his w/c due to impaired endurance. Sit>stand recliner>no UE support with CGA.  Gait training ~254f to main therapy gym using B UE support on W/C handles with CGA for safety - no balance instability noted and adequate gait speed with reciprocal stepping pattern. SpO2 100% HR 64bpm on RA.  Dynamic gait training ~1639f no UE support, with CGA for steadying - reciprocal stepping pattern with no significant R LE gait abnormalities noted - slight excessive hip external rotation (likely pt's baseline gait pattern) - did note decreased R UE arm swing with cuing/facilitation to improve.  Progressed to side stepping ~2074fn each direction, no UE support, with continued CGA and slightly more unstable towards R compared to L. SpO2 98-100% on RA.  Pt participated in Functional Gait Assessment (FGA) with score of 17/30 demonstrating high fall risk; however, pt demos only minor gait deviations for the majority of the test items. (low fall risk 25-28, medium fall risk 19-24, and high fall risk <19).   Balance: Standardized Balance Assessment Standardized Balance Assessment: Functional Gait Assessment Functional Gait  Assessment Gait assessed : Yes Gait Level Surface: Walks 20 ft in less than 7 sec but greater than 5.5 sec, uses assistive device, slower speed, mild  gait deviations, or deviates 6-10 in outside of the 12 in walkway width. Change in Gait Speed: Able to change speed, demonstrates mild gait deviations, deviates 6-10 in outside of the 12 in walkway width, or no gait deviations, unable to achieve a major change in velocity, or uses a change in velocity, or uses an assistive device. Gait with Horizontal Head Turns: Performs head turns smoothly with slight change in gait velocity (eg, minor disruption to smooth gait path), deviates 6-10 in outside 12 in walkway width, or uses an assistive device. (slight increased postural sway) Gait with Vertical Head Turns: Performs task with slight change in gait velocity (eg, minor disruption to smooth gait path), deviates 6 - 10 in outside 12 in walkway width or uses assistive device Gait and Pivot Turn: Pivot turns safely in greater than 3 sec and stops with no loss of balance, or pivot turns safely within 3 sec and stops with mild imbalance, requires small steps to catch balance. Step Over Obstacle: Is able to step over one shoe box (4.5 in total height) without changing gait speed. No evidence of imbalance. Gait with Narrow Base of Support: Ambulates less than 4 steps heel to toe or cannot perform without assistance. Gait with Eyes Closed: Walks 20 ft, slow speed, abnormal gait pattern, evidence for imbalance, deviates 10-15 in outside 12 in walkway width. Requires more than 9 sec to ambulate 20 ft. Ambulating Backwards: Walks 20 ft, uses assistive device, slower speed, mild gait deviations, deviates 6-10 in outside 12 in walkway width. Steps: Alternating feet, must use rail. Total Score: 17  Discussed pt's CLOF and  likely short LOS - notified full primary team of therapists and SW via email.  Gait training ~227f back to pt's room, no UE support, with CGA for safety and pt continuing to achieve reciprocal stepping pattern with no significant instability nor gait deviations noted but continued decreased  RUE arm  swing.  Pt left seated in recliner with needs in reach and seat belt alarm on.   Therapy Documentation Precautions:  Precautions Precautions: Fall Precaution Comments: aphasic Restrictions Weight Bearing Restrictions: No   Pain:  Denies pain during session.    Therapy/Group: Individual Therapy  CTawana Scale, PT, DPT, NCS, CSRS 06/27/2022, 7:46 AM

## 2022-06-27 NOTE — Progress Notes (Signed)
Occupational Therapy Session Note  Patient Details  Name: Richard Flynn MRN: [id number] Date of Birth: 18-Sep-1955  Session 1: Today's Date: 06/27/2022 OT Individual Time: 2774-1287 OT Individual Time Calculation (min): 63 min   Session 2: OT Individual Time: 1431-1530 OT Individual Time Calculation (min): 59 min  Short Term Goals: Week 1:  OT Short Term Goal 1 (Week 1): STG=LTG d/t pt length of stay  Skilled Therapeutic Interventions/Progress Updates:     Session 1: Patient agreeable to participate in OT session. Reports 0/10 pain level.   Patient participated in skilled OT session focusing on functional mobility, dynamic standing balance, activity tolerance and endurance. Therapist integrated dynamic standing balance tasks while working on activity tolerance and endurance during session in order to improve overall functional performance when completing BADL tasks.  Functional mobility from recliner to bathroom completed with SBA. Pt stood to complete toileting standing with distance SBA.  Handwashing completed standing as well with SBA. Pt completed functional mobility from room to Day Room with no AD at SBA. Seated rest break taken before starting first balance task.  - Pt participated in a variety of dynamic standing balance tasks while focusing on energy conservation, awareness of breathing, and endurance utilizing stability ladder to address balance during functional mobility while retrieving 12 bean bags located on right and left side of ladder on floor. CGA provided for balance.  - While standing on Airdex cushion with 4lb wrist weight placed on right UE, pt held basket of bean bags with left hand while playing corn hole with right hand attempting to place bean bags into hole. Pt retrieved bean bags after each trial and utilized seated rest break between each trial. 3 trials completed total. Pt able to place 2 bean bags into hole successfully out of 36 throws. - Simulated weed  whacker ADL task with 3lb ankle weight applied to hockey stick. Pt mimicked motion of weed whacker while navigating around Day Room and when returning to bedroom at end of session.  - Education provided on breathing techniques to utilize when completing daily tasks in order to conserve energy.    After bean bag retrieval: 98% SpO2, HR 79 bpm After bean bag toss: 96% SpO2, HR 86 bpm After bean bag toss: 95% SpO2, HR 90 bpm After bean bag toss: 97% SpO2, HR 79 bpm After weed whacker task: 98% SpO2, 100 bpm  Session 2: Patient agreeable to participate in OT session. Reports 0/10 pain level.   Patient participated in skilled OT session focusing on UB strengthening, activity tolerance, endurance, hand eye coordination, dynamic standing and functional mobility.   Therapist educated patient on energy conservation techniques with handout provided.  Pt participated in BUE strengthening exercises seated.   Exercises:  Bicep curl, hammer curl,20X; performed seated with 5lb weights.  shoulder flexion (to 90*), overhead press; 10X. Performed seated with 5lb hand weights.   Pt participated in drill for hand eye coordination, visual scanning, and standing balance utilizing treadmill.  - Managed 4 straight line cones on treadmill while standing, moving back cone to front of line to prevent falling off treadmill, completed as dual hand task,  1' minute intervals, seated rest break after each minute, 0.3 speed for first two intervals then increased to 0.4 speed. - Activity intensity increased by placing treadmill incline to 12%. Pt stood at back of treadmill. Utilized small basketball and standard soccer ball. Tossed one ball on moving treadmill belt while catching other ball prior to falling off belt Continued alternating throws  for 2' at 1.5 speed, 30" at 2.0 speed, last interval  completed 2' at 2.0 speed.  Pt demonstrated improved hand eye coordination as activity progressed. Completed with SBA.   95%  SpO2, HR 95 After treadmill activities.   Therapy Documentation Precautions:  Precautions Precautions: Fall Precaution Comments: aphasic Restrictions Weight Bearing Restrictions: No  Therapy/Group: Individual Therapy  Ailene Ravel, OTR/L,CBIS  Supplemental OT - [facility] and [facility]  06/27/2022, 12:50 PM

## 2022-06-27 NOTE — Plan of Care (Signed)
  Problem: RH Balance Goal: LTG Patient will maintain dynamic standing balance (PT) Description: LTG:  Patient will maintain dynamic standing balance with assistance during mobility activities (PT) Flowsheets (Taken 06/26/2022 1637) LTG: Pt will maintain dynamic standing balance during mobility activities with:: Independent with assistive device    Problem: Sit to Stand Goal: LTG:  Patient will perform sit to stand with assistance level (PT) Description: LTG:  Patient will perform sit to stand with assistance level (PT) Flowsheets (Taken 06/26/2022 1637) LTG: PT will perform sit to stand in preparation for functional mobility with assistance level: Independent with assistive device   Problem: RH Bed Mobility Goal: LTG Patient will perform bed mobility with assist (PT) Description: LTG: Patient will perform bed mobility with assistance, with/without cues (PT). Flowsheets (Taken 06/26/2022 1637) LTG: Pt will perform bed mobility with assistance level of: Independent with assistive device    Problem: RH Bed to Chair Transfers Goal: LTG Patient will perform bed/chair transfers w/assist (PT) Description: LTG: Patient will perform bed to chair transfers with assistance (PT). Flowsheets (Taken 06/26/2022 1637) LTG: Pt will perform Bed to Chair Transfers with assistance level: Independent with assistive device    Problem: RH Car Transfers Goal: LTG Patient will perform car transfers with assist (PT) Description: LTG: Patient will perform car transfers with assistance (PT). Flowsheets (Taken 06/26/2022 1637) LTG: Pt will perform car transfers with assist:: Supervision/Verbal cueing   Problem: RH Furniture Transfers Goal: LTG Patient will perform furniture transfers w/assist (OT/PT) Description: LTG: Patient will perform furniture transfers  with assistance (OT/PT). Flowsheets (Taken 06/26/2022 1637) LTG: Pt will perform furniture transfers with assist:: Independent with assistive device     Problem: RH Ambulation Goal: LTG Patient will ambulate in home environment (PT) Description: LTG: Patient will ambulate in home environment, # of feet with assistance (PT). Flowsheets (Taken 06/26/2022 1637) LTG: Pt will ambulate in home environ  assist needed:: Supervision/Verbal cueing LTG: Ambulation distance in home environment: at least 50 feet using LRAD to maneuver home environment Goal: LTG Patient will ambulate in community environment (PT) Description: LTG: Patient will ambulate in community environment, # of feet with assistance (PT). Flowsheets (Taken 06/26/2022 1637) LTG: Pt will ambulate in community environ  assist needed:: Supervision/Verbal cueing LTG: Ambulation distance in community environment: >300 ft using LRAD   Problem: RH Stairs Goal: LTG Patient will ambulate up and down stairs w/assist (PT) Description: LTG: Patient will ambulate up and down # of stairs with assistance (PT) Flowsheets (Taken 06/26/2022 1637) LTG: Pt will ambulate up/down stairs assist needed:: Supervision/Verbal cueing LTG: Pt will  ambulate up and down number of stairs: at least 7 steps using L HR only as per home environment

## 2022-06-28 LAB — GLUCOSE, CAPILLARY
Glucose-Capillary: 144 mg/dL — ABNORMAL HIGH (ref 70–99)
Glucose-Capillary: 156 mg/dL — ABNORMAL HIGH (ref 70–99)
Glucose-Capillary: 178 mg/dL — ABNORMAL HIGH (ref 70–99)
Glucose-Capillary: 203 mg/dL — ABNORMAL HIGH (ref 70–99)

## 2022-06-28 NOTE — IPOC Note (Signed)
Overall Plan of Care [facility]) Patient Details Name: Richard Flynn MRN: [id number] DOB: 29-May-1956  Admitting Diagnosis: Acute CVA (cerebrovascular accident) [facility])  Hospital Problems: Principal Problem:   Acute CVA (cerebrovascular accident) [facility])     Functional Problem List: Nursing Safety, Medication Management, Endurance  PT Balance, Endurance, Motor, Safety, Perception  OT Cognition, Balance, Endurance, Safety, Motor  SLP Cognition, Nutrition, Linguistic  TR         Basic ADL's: OT Bathing, Dressing, Toileting     Advanced  ADL's: OT Simple Meal Preparation, Laundry     Transfers: PT Bed Mobility, Bed to Chair, Musician, Manufacturing systems engineer, Metallurgist: PT Ambulation, Stairs     Additional Impairments: OT    SLP Swallowing, Communication, Social Cognition comprehension, expression Memory, Awareness  TR      Anticipated Outcomes Item Anticipated Outcome  Self Feeding Independent  Swallowing  Mod I   Basic self-care  Independent  Media planner Transfers Supervision  Bowel/Bladder  n/a  Transfers  Mod I  Locomotion  supervision  Communication  Supervision  Cognition  Supervision  Pain  n/a  Safety/Judgment  manage w cues   Therapy Plan: PT Intensity: Minimum of 1-2 x/day ,45 to 90 minutes PT Frequency: 5 out of 7 days PT Duration Estimated Length of Stay: 7 days OT Intensity: Minimum of 1-2 x/day, 45 to 90 minutes OT Frequency: 5 out of 7 days OT Duration/Estimated Length of Stay: 7-10 days SLP Intensity: Minumum of 1-2 x/day, 30 to 90 minutes SLP Frequency: 3 to 5 out of 7 days SLP Duration/Estimated Length of Stay: 7-10 days   Team Interventions: Nursing Interventions Patient/Family Education, Medication Management, Discharge Planning, Disease Management/Prevention  PT interventions Ambulation/gait training, Community reintegration, DME/adaptive equipment instruction, Neuromuscular re-education,  IT trainer, Psychosocial support, UE/LE Strength taining/ROM, Training and development officer, Discharge planning, Therapeutic Activities, UE/LE Coordination activities, Disease management/prevention, Functional mobility training, Patient/family education, Therapeutic Exercise  OT Interventions Community reintegration, Training and development officer, Disease mangement/prevention, Neuromuscular re-education, Patient/family education, Self Care/advanced ADL retraining, Therapeutic Exercise, UE/LE Coordination activities, Wheelchair propulsion/positioning, Cognitive remediation/compensation, Discharge planning, DME/adaptive equipment instruction, Functional mobility training, Pain management, Psychosocial support, Skin care/wound managment, Therapeutic Activities, UE/LE Strength taining/ROM  SLP Interventions Cognitive remediation/compensation, Dysphagia/aspiration precaution training, Internal/external aids, Speech/Language facilitation, Therapeutic Activities, Environmental controls, Cueing hierarchy, Functional tasks, Patient/family education  TR Interventions    SW/CM Interventions Discharge Planning, Psychosocial Support, Patient/Family Education   Barriers to Discharge MD  Medical stability, Home enviroment access/loayout, Lack of/limited family support, and Weight  Nursing Home environment access/layout, Decreased caregiver support 1 level 3 ste left rail w spouse  PT Inaccessible home environment, Decreased caregiver support, Lack of/limited family support, Insurance underwriter for SNF coverage    OT      SLP      SW Insurance for SNF coverage, Lack of/limited family support     Team Discharge Planning: Destination: PT-Home ,OT- Home , SLP-Home Projected Follow-up: PT-Home health PT, Outpatient PT, 24 hour supervision/assistance, OT-  Outpatient OT, SLP-Outpatient SLP Projected Equipment Needs: PT-To be determined, OT- To be determined, SLP-None recommended by SLP Equipment Details: PT- , OT-   Patient/family involved in discharge planning: PT- Patient, Family member/caregiver,  OT-Patient, Family member/caregiver, SLP-Patient, Family member/caregiver  MD ELOS: ~ 7 days Medical Rehab Prognosis:  Good Assessment: The patient has been admitted for CIR therapies with the diagnosis of L ACA/MCA stroke. The team will be addressing functional mobility, strength, stamina, balance, safety, adaptive techniques and  equipment, self-care, bowel and bladder mgt, patient and caregiver education, . Goals have been set at supervision. Anticipated discharge destination is home with family.        See Team Conference Notes for weekly updates to the plan of care

## 2022-06-29 DIAGNOSIS — I639 Cerebral infarction, unspecified: Secondary | ICD-10-CM | POA: Diagnosis not present

## 2022-06-29 LAB — CBC
HCT: 29.1 % — ABNORMAL LOW (ref 39.0–52.0)
Hemoglobin: 9.4 g/dL — ABNORMAL LOW (ref 13.0–17.0)
MCH: 29.8 pg (ref 26.0–34.0)
MCHC: 32.3 g/dL (ref 30.0–36.0)
MCV: 92.4 fL (ref 80.0–100.0)
Platelets: 226 10*3/uL (ref 150–400)
RBC: 3.15 MIL/uL — ABNORMAL LOW (ref 4.22–5.81)
RDW: 15.2 % (ref 11.5–15.5)
WBC: 10.6 10*3/uL — ABNORMAL HIGH (ref 4.0–10.5)
nRBC: 0.4 % — ABNORMAL HIGH (ref 0.0–0.2)

## 2022-06-29 LAB — BASIC METABOLIC PANEL
Anion gap: 11 (ref 5–15)
BUN: 37 mg/dL — ABNORMAL HIGH (ref 8–23)
CO2: 19 mmol/L — ABNORMAL LOW (ref 22–32)
Calcium: 8.1 mg/dL — ABNORMAL LOW (ref 8.9–10.3)
Chloride: 110 mmol/L (ref 98–111)
Creatinine, Ser: 1.25 mg/dL — ABNORMAL HIGH (ref 0.61–1.24)
GFR, Estimated: >60 mL/min (ref >60)
Glucose, Bld: 153 mg/dL — ABNORMAL HIGH (ref 70–99)
Potassium: 3.1 mmol/L — ABNORMAL LOW (ref 3.5–5.1)
Sodium: 140 mmol/L (ref 135–145)

## 2022-06-29 LAB — GLUCOSE, CAPILLARY
Glucose-Capillary: 159 mg/dL — ABNORMAL HIGH (ref 70–99)
Glucose-Capillary: 178 mg/dL — ABNORMAL HIGH (ref 70–99)
Glucose-Capillary: 125 mg/dL — ABNORMAL HIGH (ref 70–99)
Glucose-Capillary: 160 mg/dL — ABNORMAL HIGH (ref 70–99)

## 2022-06-29 MED ORDER — POTASSIUM CHLORIDE CRYS ER 20 MEQ PO TBCR
20.0000 meq | EXTENDED_RELEASE_TABLET | Freq: Every day | ORAL | Status: DC
  Administered 2022-06-29 – 2022-07-01 (×3): 20 meq via ORAL
  Filled 2022-06-29 (×3): qty 1

## 2022-06-29 NOTE — Progress Notes (Signed)
Physical Therapy Session Note  Patient Details  Name: Richard Flynn MRN: [id number] Date of Birth: 27-Dec-1955  Today's Date: 06/29/2022 PT Individual Time: 0900-0935 PT Individual Time Calculation (min): 35 min   Short Term Goals: Week 1:  PT Short Term Goal 1 (Week 1): STG = LTG d/t ELOS  Skilled Therapeutic Interventions/Progress Updates:     Patient in recliner with family in the room and MD rounding upon PT arrival. Patient alert and agreeable to PT session. Patient denied pain during session.  Family with questions regarding d/c planning and ELOS. Focused session outcomes assessments to provide further information on patient's fall risk and current level of function.   Therapeutic Activity: Transfers: Patient performed sit to/from stand x3 with Supervision-mod I without and AD.   Gait Training:  6 Min Walk Test:  Instructed patient to ambulate as quickly and as safely as possible for 6 minutes using LRAD. Patient was allowed to take standing rest breaks without stopping the test, but if the patient required a sitting rest break the clock would be stopped and the test would be over.  Results: 859 feet (261.8 meters, Avg speed 0.73 m/s) with supervision without an AD without LOB and x2 standing rest breaks 30-40 sec each.  Before: HR 78, SPO2 100 % on RA, RPE 0/10 After: HR 108, SPO2 100% on RA, RPE 8/10 Results indicate that the patient has reduced endurance with ambulation compared to age matched norms.  Age Matched Norms: 3-69 yo M: 572 meters MDC: 58.21 meters (190.98 feet) or 50 meters (ANPTA Core Set of Outcome Measures for Adults with Neurologic Conditions, 2018)  Neuromuscular Re-ed: Berg Balance Test Sit to Stand: Able to stand without using hands and stabilize independently Standing Unsupported: Able to stand safely 2 minutes Sitting with Back Unsupported but Feet Supported on Floor or Stool: Able to sit safely and securely 2 minutes Stand to Sit: Sits independently,  has uncontrolled descent Transfers: Able to transfer safely, minor use of hands Standing Unsupported with Eyes Closed: Able to stand 10 seconds safely Standing Ubsupported with Feet Together: Able to place feet together independently and stand 1 minute safely From Standing, Reach Forward with Outstretched Arm: Can reach confidently >25 cm (10") From Standing Position, Pick up Object from Floor: Able to pick up shoe safely and easily From Standing Position, Turn to Look Behind Over each Shoulder: Turn sideways only but maintains balance Turn 360 Degrees: Able to turn 360 degrees safely in 4 seconds or less Standing Unsupported, Alternately Place Feet on Step/Stool: Able to stand independently and safely and complete 8 steps in 20 seconds Standing Unsupported, One Foot in Front: Able to plae foot ahead of the other independently and hold 30 seconds Standing on One Leg: Tries to lift leg/unable to hold 3 seconds but remains standing independently Total Score: 47/56 Patient demonstrated increased fall risk noted by score of 47/56 on the Berg Balance Scale.  <45/56 = fall risk, <42/56 = predictive of recurrent falls, <40/56 = 100% fall risk  >41 = independent, 21-40 = assistive device, 0-20 = wheelchair level  MDC 6.9 (4 pts 45-56, 5 pts 35-44, 7 pts 25-34) (ANPTA Core Set of Outcome Measures for Adults with Neurologic Conditions, 2018)  Reviewed results and interpretation with patient a family.   Patient in recliner in the room at end of session with breaks locked and all needs within reach.   Therapy Documentation Precautions:  Precautions Precautions: Fall Precaution Comments: aphasic Restrictions Weight Bearing Restrictions: No  Therapy/Group: Individual Therapy  Suhayb Anzalone L Ziana Heyliger PT, DPT, NCS, CBIS  06/29/2022, 12:37 PM

## 2022-06-29 NOTE — Progress Notes (Signed)
Occupational Therapy Session Note  Patient Details  Name: Richard Flynn MRN: [id number] Date of Birth: 1955-07-29  Today's Date: 06/29/2022 OT Individual Time: 1430-1530 OT Individual Time Calculation (min): 60 min    Short Term Goals: Week 1:  OT Short Term Goal 1 (Week 1): STG=LTG d/t pt length of stay  Skilled Therapeutic Interventions/Progress Updates:     AM Session: Pt received sitting up in recliner in good spirits and receptive to skilled OT session requesting to take a shower this AM. Pt reporting 0/10 pain. PSO2 98% on room air HR72.  Pt sit>stand without AD supervision. Pt ambulated within his room to gather clothing and ambulated to shower supervision for functional mobility training. Pt able to complete 3/3 toileting tasks distant supervision while standing at toilet (continent void). Pt doffed clothing in standing supervision and ambulated to shower chair. Pt able to complete U/LB bathing while sitting and holding grab bars to stand to was posterior peri-care supervision. Pt requiring assistance to wash hair d/t hair matted from left over glue. Pt able to ambulate to EOB and don shirt/pants supervision. Pt educated on figure four technique for donning shoes and socks demonstrating teach back and completing task with min verbal cues. Pt able to tolerate standing at sink to brush hair and brush teeth without SOB. Pt ambulated to recliner and was left resting in recliner with call bell in reach, chair alarm on, and all needs met. Pt PSO2 remaining between 94-98% throughout session on room air.   PM Session: Pt received resting in recliner in good spirits and receptive to skilled OT session. Pt requesting to work on word finding during session. Focus this session functional cognition and endurance training.  Pt ambulated to and from therapy gym/room for sessions today with supervision without SOB noted. Pt able to tolerate standing at elevated table during therapeutic activities for ~30  minutes with seated rest break provided following.  Pt instructed to verbalize steps for completing card game "solitaire"- familiar, meaningful game to Pt. Pt able to arrange cards for game and provide simple instructions for how to complete game with mod word finding challenges noted. Pt provided increased time for word finding and questioning cue's to prompt Pt answers. Pt then completed game with supervision.  Pt vocabulary task with verbs, emotions, and opposites while standing at elevated table. Pt able to identify/correctly describe picture ~75% of the time. Pt provided mod questioning cues, occasional word options of 3, or beginning sound in word to facilitate Pt to identify picture. Pt required mod verbal cues to notice vocabulary mistakes when describing pictures. Pt educated on aphasia and simple activities to complete in room to support improving retrieval difficulties. Pt was left resting in recliner with call bell in reach, seat belt alarm on, and all needs met.   Therapy Documentation Precautions:  Precautions Precautions: Fall Precaution Comments: aphasic Restrictions Weight Bearing Restrictions: No General:   Vital Signs:   Pain: Pain Assessment Pain Scale: 0-10 Pain Score: 0-No pain ADL: ADL Eating: Set up Where Assessed-Eating: Edge of bed Grooming: Contact guard Where Assessed-Grooming: Standing at sink Upper Body Bathing: Minimal assistance Where Assessed-Upper Body Bathing: Edge of bed Lower Body Bathing: Minimal assistance Where Assessed-Lower Body Bathing: Edge of bed Upper Body Dressing: Setup Where Assessed-Upper Body Dressing: Wheelchair Lower Body Dressing: Contact guard Where Assessed-Lower Body Dressing: Wheelchair Toileting: Contact guard Where Assessed-Toileting: Glass blower/designer: Therapist, music Method: Counselling psychologist: Grab bars Tub/Shower Transfer: Metallurgist Method:  Unable to  assess Social research officer, government Method: Heritage manager: Civil engineer, contracting with back Production designer, theatre/television/film Assessed: Yes Standardized Balance Assessment Standardized Balance Assessment: Financial controller Test Sit to Stand: Able to stand without using hands and stabilize independently Standing Unsupported: Able to stand safely 2 minutes Sitting with Back Unsupported but Feet Supported on Floor or Stool: Able to sit safely and securely 2 minutes Stand to Sit: Sits independently, has uncontrolled descent Transfers: Able to transfer safely, minor use of hands Standing Unsupported with Eyes Closed: Able to stand 10 seconds safely Standing Ubsupported with Feet Together: Able to place feet together independently and stand 1 minute safely From Standing, Reach Forward with Outstretched Arm: Can reach confidently >25 cm (10") From Standing Position, Pick up Object from Floor: Able to pick up shoe safely and easily From Standing Position, Turn to Look Behind Over each Shoulder: Turn sideways only but maintains balance Turn 360 Degrees: Able to turn 360 degrees safely in 4 seconds or less Standing Unsupported, Alternately Place Feet on Step/Stool: Able to stand independently and safely and complete 8 steps in 20 seconds Standing Unsupported, One Foot in Front: Able to plae foot ahead of the other independently and hold 30 seconds Standing on One Leg: Tries to lift leg/unable to hold 3 seconds but remains standing independently Total Score: 47 Exercises:   Other Treatments:     Therapy/Group: Individual Therapy  Janey Genta 06/29/2022, 10:55 AM

## 2022-06-29 NOTE — Progress Notes (Signed)
Physical Therapy Session Note  Patient Details  Name: Richard Flynn MRN: [id number] Date of Birth: October 24, 1955  Today's Date: 06/29/2022 PT Individual Time: 1115-1201 PT Individual Time Calculation (min): 46 min   Short Term Goals: Week 1:  PT Short Term Goal 1 (Week 1): STG = LTG d/t ELOS  Skilled Therapeutic Interventions/Progress Updates:   Pt presents sitting in recliner and agreeable to therapy.  Pt transfers sit to stand throughout session from various surfaces w/ CGA to supervision.  Pt amb multiple trials up to 150' w/o AD and CGA to close supervision.  Pt performed blocks of sit to stand w/o UE support and close supervision.  Pt negotiated obstacle course of stepping over low heights/hurdles and stepping on/off Airex cushion w/ CGA to close supervision.  Pt performed same holding basket of bean bags w/o LOB.  Pt walked around all same obstacles and then picking up each obstacle and walking backward to place on mat table.  Pt notes increased RR, but O2 sats remained 100% on RA.  Discussed increased RR in light of activity.  Pt performed toe-taps to 6" platform w/ pt counting and cues for tapping only, but unable to perform.  Pt amb >150' back to room w/ quick starts/stops and visually scanning for room numbers.  Pt states frustration w/ speech/aphasia, aware of ST intervention.  Pt amb into BR w/ RW per request and CGA.  Pt managed clothing and stand to sit w/ supervision to CGA.  NT remained in room w/ pt.     Therapy Documentation Precautions:  Precautions Precautions: Fall Precaution Comments: aphasic Restrictions Weight Bearing Restrictions: No General:   Vital Signs:   Pain: 0/10   Mobility:   Locomotion :    Trunk/Postural Assessment :    Balance: Balance Balance Assessed: Yes Standardized Balance Assessment Standardized Balance Assessment: Berg Balance Test Berg Balance Test Sit to Stand: Able to stand without using hands and stabilize independently Standing  Unsupported: Able to stand safely 2 minutes Sitting with Back Unsupported but Feet Supported on Floor or Stool: Able to sit safely and securely 2 minutes Stand to Sit: Sits independently, has uncontrolled descent Transfers: Able to transfer safely, minor use of hands Standing Unsupported with Eyes Closed: Able to stand 10 seconds safely Standing Ubsupported with Feet Together: Able to place feet together independently and stand 1 minute safely From Standing, Reach Forward with Outstretched Arm: Can reach confidently >25 cm (10") From Standing Position, Pick up Object from Floor: Able to pick up shoe safely and easily From Standing Position, Turn to Look Behind Over each Shoulder: Turn sideways only but maintains balance Turn 360 Degrees: Able to turn 360 degrees safely in 4 seconds or less Standing Unsupported, Alternately Place Feet on Step/Stool: Able to stand independently and safely and complete 8 steps in 20 seconds Standing Unsupported, One Foot in Front: Able to plae foot ahead of the other independently and hold 30 seconds Standing on One Leg: Tries to lift leg/unable to hold 3 seconds but remains standing independently Total Score: 47 Exercises:   Other Treatments:      Therapy/Group: Individual Therapy  Ladoris Gene 06/29/2022, 12:11 PM

## 2022-06-29 NOTE — Progress Notes (Signed)
PROGRESS NOTE   Subjective/Complaints:  Wife states plenty of family available to help post discharge   ROS-  Pt denies SOB, abd pain, CP, N/V/C/D, and vision changes  Objective:   No results found. Recent Labs    06/29/22 0628  WBC 10.6*  HGB 9.4*  HCT 29.1*  PLT 226    Recent Labs    06/29/22 0628  NA 140  K 3.1*  CL 110  CO2 19*  GLUCOSE 153*  BUN 37*  CREATININE 1.25*  CALCIUM 8.1*     Intake/Output Summary (Last 24 hours) at 06/29/2022 0859 Last data filed at 06/28/2022 1300 Gross per 24 hour  Intake 480 ml  Output --  Net 480 ml         Physical Exam: Vital Signs Blood pressure 104/60, pulse 71, temperature 97.9 F (36.6 C), resp. rate 18, height '5\' 5"'$  (1.651 m), weight 77 kg, SpO2 96 %.    General: No acute distress Mood and affect are appropriate Heart: Regular rate and rhythm no rubs murmurs or extra sounds Lungs: Clear to auscultation, breathing unlabored, no rales or wheezes Abdomen: Positive bowel sounds, soft nontender to palpation, nondistended Extremities: No clubbing, cyanosis, or edema   Skin: No evidence of breakdown, no evidence of rash Neurologic: Cranial nerves II through XII intact, motor strength is 5/5 in bilateral deltoid, bicep, tricep, grip, hip flexor, knee extensors, ankle dorsiflexor and plantar flexor  Musculoskeletal: Full range of motion in all 4 extremities. No joint swelling   Assessment/Plan: 1. Functional deficits which require 3+ hours per day of interdisciplinary therapy in a comprehensive inpatient rehab setting. Physiatrist is providing close team supervision and 24 hour management of active medical problems listed below. Physiatrist and rehab team continue to assess barriers to discharge/monitor patient progress toward functional and medical goals  Care Tool:  Bathing    Body parts bathed by patient: Right arm, Left arm, Front perineal area,  Right lower leg, Abdomen, Left upper leg, Chest, Right upper leg, Buttocks, Left lower leg, Face         Bathing assist Assist Level: Minimal Assistance - Patient > 75%     Upper Body Dressing/Undressing Upper body dressing   What is the patient wearing?: Pull over shirt    Upper body assist Assist Level: Contact Guard/Touching assist    Lower Body Dressing/Undressing Lower body dressing      What is the patient wearing?: Pants     Lower body assist Assist for lower body dressing: Minimal Assistance - Patient > 75%     Toileting Toileting    Toileting assist Assist for toileting: Minimal Assistance - Patient > 75%     Transfers Chair/bed transfer  Transfers assist     Chair/bed transfer assist level: Contact Guard/Touching assist     Locomotion Ambulation   Ambulation assist      Assist level: Contact Guard/Touching assist Assistive device: Walker-rolling Max distance: 275 ft   Walk 10 feet activity   Assist     Assist level: Contact Guard/Touching assist Assistive device: No Device   Walk 50 feet activity   Assist    Assist level: Contact Guard/Touching assist Assistive device: No  Device    Walk 150 feet activity   Assist    Assist level: Contact Guard/Touching assist Assistive device: Walker-rolling    Walk 10 feet on uneven surface  activity   Assist     Assist level: Minimal Assistance - Patient > 75%     Wheelchair     Assist Is the patient using a wheelchair?: Yes Type of Wheelchair: Manual    Wheelchair assist level: Moderate Assistance - Patient 50 - 74% Max wheelchair distance: 50 ft    Wheelchair 50 feet with 2 turns activity    Assist        Assist Level: Moderate Assistance - Patient 50 - 74%   Wheelchair 150 feet activity     Assist      Assist Level: Maximal Assistance - Patient 25 - 49%   Blood pressure 104/60, pulse 71, temperature 97.9 F (36.6 C), resp. rate 18, height '5\' 5"'$   (1.651 m), weight 77 kg, SpO2 96 %.  Medical Problem List and Plan: 1. 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  Antithrombotics: -DVT/anticoagulation:  Pharmaceutical: Lovenox             -antiplatelet therapy: aspirin and Plavix   3. Pain Management: Tylenol   12/30- denies pain- con't regimen 4. Mood/Behavior/Sleep: LCSW to evaluate and provide emotional support             Having Insomnia- will look into trazodone for sleep             -antipsychotic agents: n/a   5. Neuropsych/cognition: This patient is not? capable of making decisions on his own behalf.   6. Skin/Wound Care: routine skin care checks   7. Fluids/Electrolytes/Nutrition: strict Is and Os and follow-up chemistries             -carb modified diet when advanced             -dysphagia 3 diet/thin liquids             -SLP eval   8: CHF: daily weight             -home Coreg 12.5 mg BID not restarted             -home Entresto not restarted             -home Lasix 20 mg , restarted at '10mg'$   Filed Weights   06/27/22 0514 06/28/22 0515 06/29/22 0500  Weight: 80.2 kg 79.5 kg 77 kg    9: Hypertension: monitor TID and prn             -home Coreg, Lasix held   Vitals:   06/28/22 1930 06/29/22 0528  BP: 134/69 104/60  Pulse: 71 71  Resp: 16 18  Temp: 97.9 F (36.6 C) 97.9 F (36.6 C)  SpO2: 95% 96%   Restart Entresto, lasix- monitor BP  12/30- BP controlled- con't regimen 10: Hyperlipidemia: continue Lipitor 80 mg daily   11: DM type 2:A1c 6.7-  CBGs q AC and q HS (no new A1c value)             -  SSI             -home regimen not restarted: Jardiance 10 mg daily, glipizide 5 mg twice daily, Lantus 28 units  daily, metformin 1000 mg twice daily   CBG (last 3)  Recent Labs    06/28/22 1651 06/28/22 2026 06/29/22 0632  GLUCAP 178* 203* 159*   Restart Jardiance  12/30- CBG's doing better- con't Jardiance, Lnatus, etc 12: CKD 3a: serum creatinine trending upward, now 1.49; monitor BMP   12/30- recheck Monday 13: Anemia, normocytic: multifactorial; follow-up CBC   14: Hypokalemia: 3.1 on 06/29/22, on lasix will supplement with KCL 62mq daily    15: Insomnia: Offer trazodone as needed   12/30- slept well per wife- sounds like has a little hangover this AM? 16: Persistent cough, on oxygen at 2 L via [place]; afebrile, normal WBC             -Duonebs as needed             -Check/update chest x-ray             -Flutter valve, cough and deep breathe             -will wean O2 as tolerated - of note, per wife (-) for COVID/Flu 1+ weeks ago   17: Vaping: +/- nicotine patches- also educated pt on risks of continuing to vape on risk of another stroke, esp when also has DM, CHF, CAD, HTN. And HLD     LOS: 4 days A FACE TO FACE EVALUATION WAS PERFORMED  ACharlett Blake1/06/2022, 8:59 AM

## 2022-06-30 ENCOUNTER — Other Ambulatory Visit (HOSPITAL_COMMUNITY): Payer: Self-pay

## 2022-06-30 DIAGNOSIS — I639 Cerebral infarction, unspecified: Secondary | ICD-10-CM | POA: Diagnosis not present

## 2022-06-30 LAB — GLUCOSE, CAPILLARY
Glucose-Capillary: 149 mg/dL — ABNORMAL HIGH (ref 70–99)
Glucose-Capillary: 177 mg/dL — ABNORMAL HIGH (ref 70–99)
Glucose-Capillary: 189 mg/dL — ABNORMAL HIGH (ref 70–99)
Glucose-Capillary: 211 mg/dL — ABNORMAL HIGH (ref 70–99)

## 2022-06-30 MED ORDER — NITROGLYCERIN 0.4 MG SL SUBL: 0.4000 mg | SUBLINGUAL_TABLET | SUBLINGUAL | 3 refills | Status: DC | PRN

## 2022-06-30 MED ORDER — ATORVASTATIN CALCIUM 80 MG PO TABS
80.0000 mg | ORAL_TABLET | Freq: Every day | ORAL | 0 refills | Status: DC
  Filled 2022-06-30: qty 30, 30d supply, fill #0

## 2022-06-30 MED ORDER — EMPAGLIFLOZIN 10 MG PO TABS
10.0000 mg | ORAL_TABLET | Freq: Every day | ORAL | 0 refills | Status: DC
  Filled 2022-06-30: qty 30, 30d supply, fill #0

## 2022-06-30 MED ORDER — TRAZODONE HCL 50 MG PO TABS
25.0000 mg | ORAL_TABLET | Freq: Every evening | ORAL | 0 refills | Status: DC | PRN
  Filled 2022-06-30: qty 30, 30d supply, fill #0

## 2022-06-30 MED ORDER — SACUBITRIL-VALSARTAN 24-26 MG PO TABS
1.0000 | ORAL_TABLET | Freq: Two times a day (BID) | ORAL | 0 refills | Status: DC
  Filled 2022-06-30: qty 60, 30d supply, fill #0

## 2022-06-30 MED ORDER — ACETAMINOPHEN 325 MG PO TABS: 325.0000 mg | ORAL_TABLET | ORAL | Status: AC | PRN

## 2022-06-30 MED ORDER — CLOPIDOGREL BISULFATE 75 MG PO TABS
75.0000 mg | ORAL_TABLET | Freq: Every day | ORAL | 1 refills | Status: AC
  Filled 2022-06-30: qty 30, 30d supply, fill #0

## 2022-06-30 MED ORDER — ASPIRIN 81 MG PO TBEC
81.0000 mg | DELAYED_RELEASE_TABLET | Freq: Every day | ORAL | 1 refills | Status: AC
  Filled 2022-06-30: qty 30, 30d supply, fill #0

## 2022-06-30 MED ORDER — INSULIN GLARGINE-YFGN 100 UNIT/ML ~~LOC~~ SOLN
10.0000 [IU] | Freq: Every day | SUBCUTANEOUS | Status: DC
  Administered 2022-06-30: 10 [IU] via SUBCUTANEOUS
  Filled 2022-06-30 (×2): qty 0.1

## 2022-06-30 MED ORDER — FUROSEMIDE 20 MG PO TABS
10.0000 mg | ORAL_TABLET | Freq: Every day | ORAL | 0 refills | Status: DC
  Filled 2022-06-30: qty 15, 30d supply, fill #0

## 2022-06-30 MED ORDER — POTASSIUM CHLORIDE CRYS ER 20 MEQ PO TBCR
20.0000 meq | EXTENDED_RELEASE_TABLET | Freq: Every day | ORAL | 0 refills | Status: DC
  Filled 2022-06-30: qty 30, 30d supply, fill #0

## 2022-06-30 NOTE — Plan of Care (Signed)
  Problem: RH Expression Communication Goal: LTG Patient will verbally express basic/complex needs(SLP) Description: LTG:  Patient will verbally express basic/complex needs, wants or ideas with cues  (SLP) Outcome: Not Met (add Reason) Goal: LTG Patient will increase word finding of common (SLP) Description: LTG:  Patient will increase word finding of common objects/daily info/abstract thoughts with cues using compensatory strategies (SLP). Outcome: Not Met (add Reason)   Problem: RH Awareness Goal: LTG: Patient will demonstrate awareness during functional activites type of (SLP) Description: LTG: Patient will demonstrate awareness during functional activites type of (SLP) Outcome: Not Met (add Reason)   Problem: RH Swallowing Goal: LTG Patient will consume least restrictive diet using compensatory strategies with assistance (SLP) Description: LTG:  Patient will consume least restrictive diet using compensatory strategies with assistance (SLP) Outcome: Completed/Met Goal: LTG Patient will participate in dysphagia therapy to increase swallow function with assistance (SLP) Description: LTG:  Patient will participate in dysphagia therapy to increase swallow function with assistance (SLP) Outcome: Completed/Met Goal: LTG Pt will demonstrate functional change in swallow as evidenced by bedside/clinical objective assessment (SLP) Description: LTG: Patient will demonstrate functional change in swallow as evidenced by bedside/clinical objective assessment (SLP) Outcome: Completed/Met   Problem: RH Memory Goal: LTG Patient will use memory compensatory aids to (SLP) Description: LTG:  Patient will use memory compensatory aids to recall biographical/new, daily complex information with cues (SLP) Outcome: Not Applicable

## 2022-06-30 NOTE — Progress Notes (Signed)
Speech Language Pathology Discharge Summary  Patient Details  Name: Richard Flynn MRN: [id number] Date of Birth: [birth year]  Date of Discharge from [facility] service:June 30, 2022  Today's Date: 06/30/2022 SLP Individual Time: 6269-4854 SLP Individual Time Calculation (min): 58 min  Skilled Therapeutic Interventions: Pt seen for skilled SLP session to address expressive language and swallowing goals. Pt sitting upright in recliner. His wife, [name], present for session.   Language: SLP facilitated structured and unstructured tasks to address word-finding goals and implement compensatory words findings strategies in conversation and language tasks. Structured task included verbally reviewing pt's daily routine and listing grocery items needed for pt's soup recipe. Throughout conversation and language tasks, pt exhibited frequent hesitations, word-finding difficulty, phonemic and semantic language errors, and revisions. Pt encouraged to "talk through" communication breakdowns in order to attempt to repair errors.   Compensatory communication strategies of use of gestures and writing words that pt was unable to verbalize appeared effective for repairing communication breakdowns. For example, when attempting to verbally list spices for pt's soup, he was better able to verbalize the word after writing it down first. Pt further reported that use of phone and internet will help him with formulating his recipes. He was able to demonstrate effective use of this strategy. Further reviewed circumlocution strategy, phonemic cues, and sentence completion strategies with pt and his wife. Handouts on expressive aphasia and word-finding strategies provided.   Recommend continue to address expressive language goals at next level of care.   Swallowing: Pt and wife denied concerns for tolerance of current Dys 3 diet and thin liquids. Per chart, he is consuming 50-100% of meals. He remains afebrile and on room air.  Pt  agreeable to trials of regular solid to determine possible diet advancement. Oral prep and transit of regular solid efficient with adequate oral clearance. No anterior spillage observed. Pharyngeal swallow initiation appeared prompt with laryngeal elevation noted. No overt or subtle s/s of aspiration were observed. No increased work of breathing observed with PO trials on this date. Pt occasionally attempted to converse while eating. Redirected pt to wait to speak until he had swallowed. Reviewed other general compensatory swallow strategies including slow pace, lingual sweep to R buccal sulci as needed, and liquid wash following solids as needed. Pt and wife verbalized good understanding.   Diet advanced to regular consistency diet at this time. Recommend continue thin liquids and PO meds as tolerated. Swallowing goals met at this time.   Patient has met 3 of 5 long term goals.  Patient to discharge at [facility] level.  Reasons goals not met: Short length of stay. Unable to address cognitive goals.   Clinical Impression/Discharge Summary: Patient has demonstrated functional gains toward communication and swallowing goals meeting 3 of 5 long-term goals this admission. Memory goal not addressed due to short rehab stay. Pt is currently communicating functional needs at the phrase/sentence level with min A verbal cues to self correct verbal errors and/or to overcome word finding difficulty. Pt was able to progress diet to regular textures and thin liquids with no clinical s/sx of aspiration and mod I to implement swallowing precautions and strategies. Patient and family education is complete and patient to discharge at overall min A level. Patient's care partner is independent to provide the necessary physical and cognitive-communication assistance at discharge. Patient would benefit from continued SLP services in outpatient setting to maximize communication skills and functional independence.   Care  Partner:  Caregiver Able to Provide Assistance: Yes  Type  of Caregiver Assistance: Cognitive;Physical  Recommendation:  Outpatient SLP  Rationale for SLP Follow Up: Maximize functional communication;Reduce caregiver burden   Equipment: None   Reasons for discharge: Discharged from hospital   Patient/Family Agrees with Progress Made and Goals Achieved: Yes    Brianne T Garretson 06/30/2022, 11:06 AM

## 2022-06-30 NOTE — Progress Notes (Signed)
PROGRESS NOTE   Subjective/Complaints:  No issues overnite , slept well, Pt seen in PT, per wife still has dressing right groin   ROS-  Pt denies SOB, abd pain, CP, N/V/C/D, and vision changes  Objective:   No results found. Recent Labs    06/29/22 0628  WBC 10.6*  HGB 9.4*  HCT 29.1*  PLT 226    Recent Labs    06/29/22 0628  NA 140  K 3.1*  CL 110  CO2 19*  GLUCOSE 153*  BUN 37*  CREATININE 1.25*  CALCIUM 8.1*     Intake/Output Summary (Last 24 hours) at 06/30/2022 0839 Last data filed at 06/30/2022 0737 Gross per 24 hour  Intake 720 ml  Output 1 ml  Net 719 ml         Physical Exam: Vital Signs Blood pressure 112/63, pulse 72, temperature 98.1 F (36.7 C), temperature source Oral, resp. rate 16, height '5\' 5"'$  (1.651 m), weight 75.7 kg, SpO2 98 %.    General: No acute distress Mood and affect are appropriate Heart: Regular rate and rhythm no rubs murmurs or extra sounds Lungs: Clear to auscultation, breathing unlabored, no rales or wheezes Abdomen: Positive bowel sounds, soft nontender to palpation, nondistended Extremities: No clubbing, cyanosis, or edema   Skin: No evidence of breakdown, no evidence of rash Neurologic: Cranial nerves II through XII intact, motor strength is 5/5 in bilateral deltoid, bicep, tricep, grip, hip flexor, knee extensors, ankle dorsiflexor and plantar flexor  Musculoskeletal: Full range of motion in all 4 extremities. No joint swelling   Assessment/Plan: 1. Functional deficits which require 3+ hours per day of interdisciplinary therapy in a comprehensive inpatient rehab setting. Physiatrist is providing close team supervision and 24 hour management of active medical problems listed below. Physiatrist and rehab team continue to assess barriers to discharge/monitor patient progress toward functional and medical goals  Care Tool:  Bathing    Body parts bathed by  patient: Right arm, Left arm, Front perineal area, Right lower leg, Abdomen, Left upper leg, Chest, Right upper leg, Buttocks, Left lower leg, Face         Bathing assist Assist Level: Minimal Assistance - Patient > 75%     Upper Body Dressing/Undressing Upper body dressing   What is the patient wearing?: Pull over shirt    Upper body assist Assist Level: Contact Guard/Touching assist    Lower Body Dressing/Undressing Lower body dressing      What is the patient wearing?: Pants     Lower body assist Assist for lower body dressing: Minimal Assistance - Patient > 75%     Toileting Toileting    Toileting assist Assist for toileting: Supervision/Verbal cueing     Transfers Chair/bed transfer  Transfers assist     Chair/bed transfer assist level: Contact Guard/Touching assist     Locomotion Ambulation   Ambulation assist      Assist level: Supervision/Verbal cueing Assistive device: No Device Max distance: 150   Walk 10 feet activity   Assist     Assist level: Supervision/Verbal cueing Assistive device: No Device   Walk 50 feet activity   Assist    Assist level: Supervision/Verbal  cueing Assistive device: No Device    Walk 150 feet activity   Assist    Assist level: Supervision/Verbal cueing Assistive device: No Device    Walk 10 feet on uneven surface  activity   Assist     Assist level: Minimal Assistance - Patient > 75%     Wheelchair     Assist Is the patient using a wheelchair?: Yes Type of Wheelchair: Manual    Wheelchair assist level: Moderate Assistance - Patient 50 - 74% Max wheelchair distance: 50 ft    Wheelchair 50 feet with 2 turns activity    Assist        Assist Level: Moderate Assistance - Patient 50 - 74%   Wheelchair 150 feet activity     Assist      Assist Level: Maximal Assistance - Patient 25 - 49%   Blood pressure 112/63, pulse 72, temperature 98.1 F (36.7 C), temperature  source Oral, resp. rate 16, height '5\' 5"'$  (1.651 m), weight 75.7 kg, SpO2 98 %.  Medical Problem List and Plan: 1. 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  Antithrombotics: -DVT/anticoagulation:  Pharmaceutical: Lovenox             -antiplatelet therapy: aspirin and Plavix   3. Pain Management: Tylenol   12/30- denies pain- con't regimen 4. Mood/Behavior/Sleep: LCSW to evaluate and provide emotional support             Having Insomnia- will look into trazodone for sleep             -antipsychotic agents: n/a   5. Neuropsych/cognition: This patient is not? capable of making decisions on his own behalf.   6. Skin/Wound Care: routine skin care checks   7. Fluids/Electrolytes/Nutrition: strict Is and Os and follow-up chemistries             -carb modified diet when advanced             -dysphagia 3 diet/thin liquids             -SLP eval   8: CHF: daily weight             -home Coreg 12.5 mg BID not restarted             -home Entrestorestarted at 24/'26mg'$  home dose              -home Lasix 20 mg , restarted at '10mg'$   Filed Weights   06/28/22 0515 06/29/22 0500 06/30/22 0500  Weight: 79.5 kg 77 kg 75.7 kg    9: Hypertension: monitor TID and prn             -home Coreg, Lasix held   Vitals:   06/29/22 2050 06/30/22 0417  BP: 105/71 112/63  Pulse: 77 72  Resp: 16 16  Temp: 97.7 F (36.5 C) 98.1 F (36.7 C)  SpO2: 98% 98%   Restart Entresto, lasix- monitor BP  06/30/22- BP controlled- con't regimen  On lower dose than at home due to low BPs 10: Hyperlipidemia: continue Lipitor 80 mg daily  11: DM type 2:A1c 6.7-  CBGs q AC and q HS (no new A1c value)             -SSI              -home regimen not restarted: Jardiance 10 mg daily, glipizide 5 mg twice daily, Lantus 28 units daily, metformin 1000 mg twice daily   CBG (last 3)  Recent Labs    06/29/22 1617 06/29/22 2042 06/30/22 0555  GLUCAP 125* 160* 149*   Restart Jardiance '10mg'$    12/30- CBG's doing better- con't Jardiance, Lnatus, etc 12: CKD 3a: serum creatinine trending upward, now 1.49; monitor BMP   12/30- recheck Monday 13: Anemia, normocytic: multifactorial; follow-up CBC   14: Hypokalemia: 3.1 on 06/29/22, on lasix will supplement with KCL 90mq daily , recheck K+ in am    15: Insomnia: Offer trazodone as needed   12/30- slept well per wife- sounds like has a little hangover this AM? 16: Persistent cough, on oxygen at 2 L via ; afebrile, normal WBC             -Duonebs as needed             -Check/update chest x-ray             -Flutter valve, cough and deep breathe             -will wean O2 as tolerated - of note, per wife (-) for COVID/Flu 1+ weeks ago   17: Vaping: +/- nicotine patches- also educated pt on risks of continuing to vape on risk of another stroke, esp when also has DM, CHF, CAD, HTN. And HLD     LOS: 5 days A FACE TO FACE EVALUATION WAS PERFORMED  ACharlett Blake1/07/2022, 8:39 AM

## 2022-06-30 NOTE — Progress Notes (Signed)
Physical Therapy Discharge Summary  Patient Details  Name: Richard Flynn MRN: [id number] Date of Birth: Oct 30, 1955  Date of Discharge from PT service:June 30, 2022  Today's Date: 06/30/2022 PT Individual Time: 0807-0905 PT Individual Time Calculation (min): 58 min    Patient has met 9 of 9 long term goals due to improved activity tolerance, improved balance, improved postural control, ability to compensate for deficits, improved attention, and improved awareness.  Patient to discharge at an ambulatory level independent at household level and Supervision for community without an AD.  Patient's care partner is independent to provide the necessary physical and cognitive assistance at discharge.  All goals met.  Recommendation:  Patient will benefit from ongoing skilled PT services in outpatient setting to continue to advance safe functional mobility, address ongoing impairments in high level dynamic standing balance and gait training, and minimize fall risk.  Equipment: No equipment provided, none needed  Reasons for discharge: treatment goals met and discharge from hospital  Patient/family agrees with progress made and goals achieved: Yes  Skilled Therapeutic Interventions/Progress Updates:  Pt received sitting in recliner with his wife, Richard Flynn, present and pt agreeable to therapy session with plan for family education/training. Pt performed all sit<>stands independently during session. Gait to ortho gym, no AD, with supervision progressed to independently.   Simulated ambulatory car transfer, sedan height, no AD, independently with pt reporting no concern.   Gait training up/down ramp, over mulch, and up/down curb step without using railing with supervision for safety in these community simulated activities.  Stair navigation training ascending/descending 12 steps using L HR only with distant supervision.  Pt's wife reports feeling comfortable and confident providing supervision  assist for planned D/C tomorrow. Pt/wife report no additional questions/concerns at this time.  Pt participated in 1 set of each of the following exercises with therapist educating/cuing for proper form/technique and educating on how to safely set them up at home. Pt demonstrates understanding.  Access Code: [email or web address] URL: [email or web address] Date: 06/30/2022 Prepared by: Page Spiro  Exercises - Sit to Stand with Arms Crossed  - 1 x daily - 7 x weekly - 2 sets - 15 reps - Heel Raises with Counter Support  - 1 x daily - 7 x weekly - 2 sets - 10-12 reps - Alternating Step Taps with Counter Support  - 1 x daily - 7 x weekly - 2 sets - 20 reps - Standing Tandem Balance with Counter Support  - 1 x daily - 7 x weekly - 2 sets - 30 seconds hold - Side Stepping with Counter Support  - 1 x daily - 7 x weekly - 2 sets - 10 reps - Walking  - 1 x daily - 7 x weekly - 2 sets - 6 minutes hold  Provided pt with HEP printout.   Gait back to pt's room, no AD, independently. Therapist educated pt on safety awareness when mobilizing in hospital room. Pt left seated in recliner with needs in reach and pt cleared to be independent in room - nursing staff notified and sign placed on pt's door.  PT Discharge Precautions/Restrictions Precautions Precautions: Fall;Other (comment) Precaution Comments: expressive aphasia Restrictions Weight Bearing Restrictions: No Pain Pain Assessment Pain Scale: 0-10 Pain Score: 0-No pain Pain Interference Pain Interference Pain Effect on Sleep: 0. Does not apply - I have not had any pain or hurting in the past 5 days Pain Interference with Therapy Activities: 1. Rarely or not at all Pain Interference with Day-to-Day Activities: 1. Rarely or  not at all Vision/Perception  Vision - History Ability to See in Adequate Light: 0 Adequate Vision - Assessment Eye Alignment: Within Functional Limits Tracking/Visual Pursuits: Able to track stimulus in all  quads without difficulty Perception Perception: Within Functional Limits Praxis Praxis: Intact  Cognition Overall Cognitive Status:  (pt with expressive aphasia) Arousal/Alertness: Awake/alert Orientation Level: Oriented X4 Year: 2024 Month: January Day of Week: Correct Attention: Focused;Sustained;Selective Focused Attention: Appears intact Sustained Attention: Appears intact Selective Attention: Appears intact Memory: Appears intact Safety/Judgment: Appears intact Sensation Sensation Light Touch: Appears Intact Hot/Cold: Not tested Proprioception: Appears Intact Stereognosis: Not tested Coordination Gross Motor Movements are Fluid and Coordinated: Yes Motor  Motor Motor: Within Functional Limits  Mobility Bed Mobility Bed Mobility: Supine to Sit;Sit to Supine Supine to Sit: Independent Sit to Supine: Independent Transfers Transfers: Sit to Stand;Stand to [employer] Transfers Sit to Stand: Independent Stand to Sit: Independent Stand Pivot Transfers: Independent Transfer (Assistive device): None Locomotion  Gait Ambulation: Yes Gait Assistance: Supervision/Verbal cueing;Independent Gait Distance (Feet): 859 Feet (during 6MWT with 2x standing rest breaks) Assistive device: None Gait Gait: Yes Gait Pattern: Within Functional Limits Stairs / Additional Locomotion Stairs: Yes Stairs Assistance: Supervision/Verbal cueing Stair Management Technique: One rail Left;Alternating pattern Number of Stairs: 12 Height of Stairs: 6 Ramp: Independent Curb: Supervision/Verbal cueing Wheelchair Mobility Wheelchair Mobility: No  Trunk/Postural Assessment  Cervical Assessment Cervical Assessment: Exceptions to [facility] (mild forward head) Thoracic Assessment Thoracic Assessment: Exceptions to [facility] (shoulder rounding) Lumbar Assessment Lumbar Assessment: Within Functional Limits Postural Control Postural Control: Within Functional Limits  Balance  Balance Balance  Assessed: Yes Standardized Balance Assessment Standardized Balance Assessment: Berg Balance Test;Functional Gait Assessment (Berg Balance Test from 06/29/2022 and FGA from 06/27/2022) Berg Balance Test Sit to Stand: Able to stand without using hands and stabilize independently Standing Unsupported: Able to stand safely 2 minutes Sitting with Back Unsupported but Feet Supported on Floor or Stool: Able to sit safely and securely 2 minutes Stand to Sit: Sits independently, has uncontrolled descent Transfers: Able to transfer safely, minor use of hands Standing Unsupported with Eyes Closed: Able to stand 10 seconds safely Standing Ubsupported with Feet Together: Able to place feet together independently and stand 1 minute safely From Standing, Reach Forward with Outstretched Arm: Can reach confidently >25 cm (10") From Standing Position, Pick up Object from Floor: Able to pick up shoe safely and easily From Standing Position, Turn to Look Behind Over each Shoulder: Turn sideways only but maintains balance Turn 360 Degrees: Able to turn 360 degrees safely in 4 seconds or less Standing Unsupported, Alternately Place Feet on Step/Stool: Able to stand independently and safely and complete 8 steps in 20 seconds Standing Unsupported, One Foot in Front: Able to plae foot ahead of the other independently and hold 30 seconds Standing on One Leg: Tries to lift leg/unable to hold 3 seconds but remains standing independently Total Score: 47 Static Sitting Balance Static Sitting - Balance Support: Feet supported Static Sitting - Level of Assistance: 7: Independent Dynamic Sitting Balance Dynamic Sitting - Balance Support: Feet supported Dynamic Sitting - Level of Assistance: 7: Independent Static Standing Balance Static Standing - Balance Support: During functional activity Static Standing - Level of Assistance: 7: Independent Dynamic Standing Balance Dynamic Standing - Balance Support: During functional  activity Dynamic Standing - Level of Assistance: 7: Independent;5: Stand by assistance Functional Gait  Assessment Gait assessed : Yes Gait Level Surface: Walks 20 ft in less than 7 sec but greater than 5.5 sec,  uses assistive device, slower speed, mild gait deviations, or deviates 6-10 in outside of the 12 in walkway width. Change in Gait Speed: Able to change speed, demonstrates mild gait deviations, deviates 6-10 in outside of the 12 in walkway width, or no gait deviations, unable to achieve a major change in velocity, or uses a change in velocity, or uses an assistive device. Gait with Horizontal Head Turns: Performs head turns smoothly with slight change in gait velocity (eg, minor disruption to smooth gait path), deviates 6-10 in outside 12 in walkway width, or uses an assistive device. (slight increased postural sway) Gait with Vertical Head Turns: Performs task with slight change in gait velocity (eg, minor disruption to smooth gait path), deviates 6 - 10 in outside 12 in walkway width or uses assistive device Gait and Pivot Turn: Pivot turns safely in greater than 3 sec and stops with no loss of balance, or pivot turns safely within 3 sec and stops with mild imbalance, requires small steps to catch balance. Step Over Obstacle: Is able to step over one shoe box (4.5 in total height) without changing gait speed. No evidence of imbalance. Gait with Narrow Base of Support: Ambulates less than 4 steps heel to toe or cannot perform without assistance. Gait with Eyes Closed: Walks 20 ft, slow speed, abnormal gait pattern, evidence for imbalance, deviates 10-15 in outside 12 in walkway width. Requires more than 9 sec to ambulate 20 ft. Ambulating Backwards: Walks 20 ft, uses assistive device, slower speed, mild gait deviations, deviates 6-10 in outside 12 in walkway width. Steps: Alternating feet, must use rail. Total Score: 17 Extremity Assessment      RLE Assessment RLE Assessment: Within  Functional Limits Active Range of Motion (AROM) Comments: WNL General Strength Comments: assessed in sitting RLE Strength Right Hip Flexion: 4+/5 Right Knee Flexion: 5/5 Right Knee Extension: 5/5 Right Ankle Dorsiflexion: 5/5 Right Ankle Plantar Flexion: 5/5 LLE Assessment LLE Assessment: Within Functional Limits Active Range of Motion (AROM) Comments: WNL General Strength Comments: assessed in sitting 5/5 throughout   [facility] , PT, DPT, NCS, CSRS 06/30/2022, 8:35 AM

## 2022-06-30 NOTE — Plan of Care (Signed)
  Problem: Sit to Stand Goal: LTG:  Patient will perform sit to stand in prep for activites of daily living with assistance level (OT) Description: LTG:  Patient will perform sit to stand in prep for activites of daily living with assistance level (OT) Outcome: Completed/Met   Problem: RH Bathing Goal: LTG Patient will bathe all body parts with assist levels (OT) Description: LTG: Patient will bathe all body parts with assist levels (OT) Outcome: Completed/Met   Problem: RH Toileting Goal: LTG Patient will perform toileting task (3/3 steps) with assistance level (OT) Description: LTG: Patient will perform toileting task (3/3 steps) with assistance level (OT)  Outcome: Completed/Met   Problem: RH Simple Meal Prep Goal: LTG Patient will perform simple meal prep w/assist (OT) Description: LTG: Patient will perform simple meal prep with assistance, with/without cues (OT). Outcome: Completed/Met   Problem: RH Laundry Goal: LTG Patient will perform laundry w/assist, cues (OT) Description: LTG: Patient will perform laundry with assistance, with/without cues (OT). Outcome: Completed/Met   Problem: RH Toilet Transfers Goal: LTG Patient will perform toilet transfers w/assist (OT) Description: LTG: Patient will perform toilet transfers with assist, with/without cues using equipment (OT) Outcome: Completed/Met   Problem: RH Tub/Shower Transfers Goal: LTG Patient will perform tub/shower transfers w/assist (OT) Description: LTG: Patient will perform tub/shower transfers with assist, with/without cues using equipment (OT) Outcome: Completed/Met   Problem: RH Awareness Goal: LTG: Patient will demonstrate awareness during functional activites type of (OT) Description: LTG: Patient will demonstrate awareness during functional activites type of (OT) Outcome: Completed/Met   Problem: RH Balance Goal: LTG Patient will maintain dynamic standing with ADLs (OT) Description: LTG:  Patient will  maintain dynamic standing balance with assist during activities of daily living (OT)  Outcome: Completed/Met

## 2022-06-30 NOTE — Patient Care Conference (Signed)
Inpatient RehabilitationTeam Conference and Plan of Care Update Date: 06/29/2022   Time: 17:30 PM    Patient Name: Richard Flynn      Medical Record Number: [id number]  Date of Birth: Sep 05, 1955 Sex: Male         Room/Bed: 4W02C/4W02C-01 Payor Info: Payor: [employer] / Plan: [employer] HMO / Product Type: *No Product type* /    Admit Date/Time:  06/25/2022  6:37 PM  Primary Diagnosis:  Acute CVA (cerebrovascular accident) [facility])  Hospital Problems: Principal Problem:   Acute CVA (cerebrovascular accident) [facility])    Expected Discharge Date: Expected Discharge Date: 07/01/22  Team Members Present: Physician leading conference: Dr. Claudette Laws Social Worker Present: Lavera Guise, BSW Nurse Present: Chana Bode, RN PT Present: Casimiro Needle, PT OT Present: Other (comment) Bonnell Public, OT)     Current Status/Progress Goal Weekly Team Focus  Bowel/Bladder   Patient is Contient of Bowel and Bladder. LBM 06/29/2021  Continent of bowel and bladder paitent will remain contient of B/B   assess bowel function q shift    Swallow/Nutrition/ Hydration   Regular textures with thin liquids, mod I   Mod I  Education complete    ADL's   Supervision shower transfer, Independent with BADLs and toilet transfers   Supervision shower transfer, Indepdent BADLs   Functional Cognition training, BADL retraining, functional mobility training, family education, d/c planning    Mobility   independent bed mobility, independent sit<>stand and stand pivot transfers without AD, independent gait >256ft without AD, supervision stair navigation using HRs   independent/supervision overall at ambulatory level  activity tolerance, pt/family education, dynamic standing balance, dynamic gait training, B LE strenghtening, transfer training    Communication   Min A   Supervision   word-finding strategies-education complete    Safety/Cognition/ Behavioral Observations   Supervision-Min A   Supervision   Family education complete    Pain   patients pain level is a 0/10  N/a patient will continue to have a pain level of 0/10   assess pain level q shift    Skin   skin is intact  N/a no new skin breakdown  assess skin q shift      Discharge Planning:  Discharging home tomorrow Home with wife ;1 level 3 ste left rail  Team Discussion: Patient is doing well post CVA. Patient on target to meet rehab goals: yes  *See Care Plan and progress notes for long and short-term goals.   Revisions to Treatment Plan:  N/a  Teaching Needs: Safety, medications, dietary modifications, loop recorder maintenance, smoking cessation, transfers, etc.  Current Barriers to Discharge: Decreased caregiver support and Home enviroment access/layout  Possible Resolutions to Barriers: Family education OP follow up services No DME recommended     Medical Summary Current Status: CHF compensated , BP soft, DM fair control, hypo K+  Barriers to Discharge: Medical stability   Possible Resolutions to Barriers/Weekly Focus: Supplement KCL,recheck potassium prior to d/c , will need close cardiology f/u, adjust DM meds   Continued Need for Acute Rehabilitation Level of Care: The patient requires daily medical management by a physician with specialized training in physical medicine and rehabilitation for the following reasons: Direction of a multidisciplinary physical rehabilitation program to maximize functional independence : Yes Medical management of patient stability for increased activity during participation in an intensive rehabilitation regime.: Yes Analysis of laboratory values and/or radiology reports with any subsequent need for medication adjustment and/or medical intervention. : Yes   I attest  that I was present, lead the team conference, and concur with the assessment and plan of the team.   Pamelia Hoit 07/01/2022, 7:54 AM

## 2022-06-30 NOTE — Discharge Summary (Signed)
Physician Discharge Summary  Patient ID: ADONI GREENOUGH MRN: [id number] DOB/AGE: 07-31-1955 67 y.o.  Admit date: 06/25/2022 Discharge date: 07/01/2022  Discharge Diagnoses:  Principal Problem:   Acute CVA (cerebrovascular accident) [facility]) Active problems:   Discharged Condition: good  Significant Diagnostic Studies: EP PPM/ICD IMPLANT  Result Date: 06/25/2022 CONCLUSIONS:  1. 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 No early apparent complications.   DG Chest 2 View  Result Date: 06/25/2022 CLINICAL DATA:  Cough EXAM: CHEST - 2 VIEW COMPARISON:  06/14/2020 FINDINGS: Left AICD remains in place, unchanged. Cardiomegaly, vascular congestion. Diffuse interstitial and airspace opacities concerning for edema. Small left effusion. No acute bony abnormality. IMPRESSION: Worsening bilateral interstitial and airspace opacities most compatible with edema/CHF. Electronically Signed   By: Rolm Baptise M.D.   On: 06/25/2022 20:26    Labs:  Basic Metabolic Panel: Recent Labs  Lab 06/29/22 0628 07/01/22 0522  NA 140  --   K 3.1* 3.4*  CL 110  --   CO2 19*  --   GLUCOSE 153*  --   BUN 37*  --   CREATININE 1.25*  --   CALCIUM 8.1*  --     CBC: Recent Labs  Lab 06/29/22 0628  WBC 10.6*  HGB 9.4*  HCT 29.1*  MCV 92.4  PLT 226    CBG: Recent Labs  Lab 06/30/22 0555 06/30/22 1201 06/30/22 1619 06/30/22 2057 07/01/22 0607  GLUCAP 149* 177* 189* 211* 146*    Brief HPI:   Richard Flynn is a 67 y.o. male who presented to the emergency department on 06/20/2022 via EMS for evaluation of acute onset of right-sided weakness.  The patient woke up in his usual state of health that morning and was making coffee with his wife.  His wife watched the patient walk into the living room when he suddenly collapsed and rolled onto the couch and was unable to speak.  He demonstrated right facial droop.  Upon arrival to the emergency department, he had a  period of unresponsiveness requiring bag ventilation.  He was administered D10 for blood glucose of 62 with improvement to 154.  Code stroke called and neurology evaluated.  Stat CT head was obtained without evidence of ICH.  His last known well was at 8:30 AM.  He was in the window for tenecteplase and this was administered at 10:10 AM.  Vessel imaging revealed emergent large vessel occlusion with left MCA M1 occlusion and IR was activated.  Underwent thrombectomy by Dr. Estanislado Pandy.  After the procedure, he was extubated but had mild respiratory distress and critical care team evaluated him at the bedside.  He did not require intubation or critical care management. Stat CT of the head was performed later that afternoon due to change in neurostatus.  This was negative for any malignant edema or significant midline shift.  CT a of head and neck were then performed.  This was reviewed and negative for any emergent LVO.  Due to status of MRI incompatible pacemaker, the patient underwent follow-up CT head on 12/24.  This revealed the left M1 to be patent.  His expressive aphasia persists.  DAPT initiated and patient now on aspirin and Plavix.  Pacemaker interrogation recommended to rule out atrial fibrillation.  Potassium supplement given for hypokalemia.  2D echo reveals ejection fraction estimated at 25 to 30%.  His home Entresto and Coreg were held.  His pacemaker is not able to be interrogated therefore electrophysiology consultation was  obtained and loop recorder was placed 12/28.  Sliding scale insulin use to manage type 2 diabetes mellitus and his Lipitor was continued.  Is tolerating dysphagia 3 diet with thin liquids.   Hospital Course: JAMAHL Flynn was admitted to rehab 06/25/2022 for inpatient therapies to consist of PT, ST and OT at least three hours five days a week. Past admission physiatrist, therapy team and rehab RN have worked together to provide customized collaborative inpatient rehab. Follow-up  chest x-ray revealed pulmonary edema and Entresto and Lasix restarted. Hypokalemia on 1/1 and started on potassium supplementation daily. Creatinine was followed and was down to 1.25 on 1/1.  Potassium improving up to 3.4. Diet was advanced to dysphagia 3 and then to regular.   Blood pressures were monitored on TID basis and restarted Entresto and Lasix. Coreg held.  Diabetes has been monitored with ac/hs CBG checks and SSI was use prn for tighter BS control. Sliding scale insulin and Jardiance continued. Was requiring about 10-12 units of Novolog daily so resumed his Lantus at only 10 units daily on 1/2. The patient's home medication list verified with he and his wife listed Glipizide and metformin. These medications were not resumed and he was told to not restart at home. Continue CBGs 4 times daily at home and record.   Rehab course: During patient's stay in rehab weekly team conferences were held to monitor patient's progress, set goals and discuss barriers to discharge. At admission, patient required CGA-min A with basic self-care skills and min assist with mobility.  He has had improvement in activity tolerance, balance, postural control as well as ability to compensate for deficits. He has had improvement in functional use RUE/LUE  and RLE/LLE as well as improvement in awareness. Throughout conversation and language tasks, pt exhibited frequent hesitations, word-finding difficulty, phonemic and semantic language errors, and revisions.  Pt encouraged to "talk Pt amb into BR w/ RW per request and CGA. Pt managed clothing and stand to sit w/ supervision to CGA. through" communication breakdowns in order to attempt to repair errors.    Disposition: Home Discharge disposition: 01-Home or Self Care      Diet: carb modified  Special Instructions: No driving, alcohol consumption or tobacco use.  30-35 minutes were spent on discharge planning and discharge summary.  Discharge Instructions      Ambulatory referral to Neurology   Complete by: As directed    An appointment is requested in approximately: 4 weeks   Ambulatory referral to Physical Medicine Rehab   Complete by: As directed    Hospital follow-up   Discharge patient   Complete by: As directed    Discharge disposition: 01-Home or Self Care   Discharge patient date: 07/01/2022      Allergies as of 07/01/2022   No Known Allergies      Medication List     STOP taking these medications    carvedilol 12.5 MG tablet Commonly known as: COREG   enoxaparin 40 MG/0.4ML injection Commonly known as: LOVENOX   insulin aspart 100 UNIT/ML injection Commonly known as: novoLOG   pantoprazole 40 MG tablet Commonly known as: PROTONIX   senna-docusate 8.6-50 MG tablet Commonly known as: Senokot-S       TAKE these medications    Accu-Chek Softclix Lancets lancets Use as directed.   acetaminophen 325 MG tablet Commonly known as: TYLENOL Take 1-2 tablets (325-650 mg total) by mouth every 4 (four) hours as needed for mild pain.   aspirin EC 81 MG tablet  Take 1 tablet (81 mg total) by mouth daily. What changed: additional instructions   atorvastatin 80 MG tablet Commonly known as: LIPITOR Take 1 tablet (80 mg total) by mouth daily.   clopidogrel 75 MG tablet Commonly known as: PLAVIX Take 1 tablet (75 mg total) by mouth daily.   empagliflozin 10 MG Tabs tablet Commonly known as: JARDIANCE Take 1 tablet (10 mg total) by mouth daily.   Entresto 24-26 MG Generic drug: sacubitril-valsartan Take 1 tablet by mouth 2 (two) times daily.   furosemide 20 MG tablet Commonly known as: LASIX Take 0.5 tablets (10 mg total) by mouth daily. What changed: how much to take   insulin glargine-yfgn 100 UNIT/ML injection Commonly known as: SEMGLEE Inject 0.1 mLs (10 Units total) into the skin at bedtime.   nicotine 21 mg/24hr patch Commonly known as: NICODERM CQ - dosed in mg/24 hours Place 1 patch (21 mg total)  onto the skin daily.   nitroGLYCERIN 0.4 MG SL tablet Commonly known as: NITROSTAT Place 1 tablet (0.4 mg total) under the tongue every 5 (five) minutes as needed for chest pain.   potassium chloride SA 20 MEQ tablet Commonly known as: KLOR-CON M Take 1 tablet (20 mEq total) by mouth daily.   traZODone 50 MG tablet Commonly known as: DESYREL Take 0.5-1 tablets (25-50 mg total) by mouth at bedtime as needed for sleep.        Follow-up Information     Kirsteins, Luanna Salk, MD Follow up.   Specialty: Physical Medicine and Rehabilitation Why: office will call you to arrange your appt (sent) Contact information: [place] [address] [phone]         [facility] Follow up.   Why: Call office in 1-2 days to make arrangments for hospital follow-up appointment Contact information: [address]     [address] [place] [address] [phone]        Lelon Perla, MD Follow up.   Specialty: Cardiology Why: Call office in 1-2 days to make arrangments for hospital follow-up appointment Contact information: [place] [address] [phone]         Irving Shows, PA-C Follow up.   Specialty: Physician Assistant Why: Call in 1-2 days to make arrangments for hospital follow-up. follow-up lab work for potassium level. Contact information: [place] [address] [phone]                 Signed: Barbie Banner 07/03/2022, 9:30 AM

## 2022-06-30 NOTE — Discharge Instructions (Addendum)
Inpatient Rehab Discharge Instructions  Richard Flynn Discharge date and time: 07/01/2022  Activities/Precautions/ Functional Status: Activity: no lifting, driving, or strenuous exercise until cleared by MD Diet: cardiac diet>>dysphagia 3 diet with thin liquids; cut solids into small pieces, avoid conversation and distractions during meals Wound Care: keep wound clean and dry Functional status:  ___ No restrictions     ___ Walk up steps independently ___ 24/7 supervision/assistance   ___ Walk up steps with assistance _x__ Intermittent supervision/assistance  ___ Bathe/dress independently ___ Walk with walker     ___ Bathe/dress with assistance ___ Walk Independently    ___ Shower independently ___ Walk with assistance    __x_ Shower with assistance __x_ No alcohol     ___ Return to work/school ________ Special Instructions: No driving, alcohol consumption or tobacco use.         STROKE/TIA DISCHARGE INSTRUCTIONS SMOKING Cigarette smoking nearly doubles your risk of having a stroke & is the single most alterable risk factor  If you smoke or have smoked in the last 12 months, you are advised to quit smoking for your health. Most of the excess cardiovascular risk related to smoking disappears within a year of stopping. Ask you doctor about anti-smoking medications [place] Quit Line: 1-800-QUIT NOW Free Smoking Cessation Classes [phone]  CHOLESTEROL Know your levels; limit fat & cholesterol in your diet  Lipid Panel     Component Value Date/Time   CHOL 111 06/21/2022 0452   TRIG 134 06/22/2022 0342   HDL 14 (L) 06/21/2022 0452   CHOLHDL 7.9 06/21/2022 0452   VLDL UNABLE TO CALCULATE IF TRIGLYCERIDE OVER 400 mg/dL 06/21/2022 0452   LDLCALC UNABLE TO CALCULATE IF TRIGLYCERIDE OVER 400 mg/dL 06/21/2022 0452     Many patients benefit from treatment even if their cholesterol is at goal. Goal: Total Cholesterol (CHOL) less than 160 Goal:  Triglycerides (TRIG) less than 150 Goal:   HDL greater than 40 Goal:  LDL (LDLCALC) less than 100   BLOOD PRESSURE American Stroke Association blood pressure target is less that 120/80 mm/Hg  Your discharge blood pressure is:  BP: 112/63 Monitor your blood pressure Limit your salt and alcohol intake Many individuals will require more than one medication for high blood pressure  DIABETES (A1c is a blood sugar average for last 3 months) Goal HGBA1c is under 7% (HBGA1c is blood sugar average for last 3 months)  Diabetes: Diagnosis of diabetes:  Your A1c:7.6 %    Lab Results  Component Value Date   HGBA1C 7.6 (H) 06/20/2022    Your HGBA1c can be lowered with medications, healthy diet, and exercise. Check your blood sugar as directed by your physician Call your physician if you experience unexplained or low blood sugars.  PHYSICAL ACTIVITY/REHABILITATION Goal is 30 minutes at least 4 days per week  Activity: Increase activity slowly, Therapies: Physical Therapy: Outpatient Return to work: when cleared by MD Activity decreases your risk of heart attack and stroke and makes your heart stronger.  It helps control your weight and blood pressure; helps you relax and can improve your mood. Participate in a regular exercise program. Talk with your doctor about the best form of exercise for you (dancing, walking, swimming, cycling).  DIET/WEIGHT Goal is to maintain a healthy weight  Your discharge diet is:  Diet Order             DIET DYS 3 Room service appropriate? Yes with Assist; Fluid consistency: Thin  Diet effective now  thin liquids Your height is:  Height: '5\' 5"'$  (165.1 cm) Your current weight is: Weight: 75.7 kg Your Body Mass Index (BMI) is:  BMI (Calculated): 27.77 Following the type of diet specifically designed for you will help prevent another stroke. Your goal weight range is:   Your goal Body Mass Index (BMI) is 19-24. Healthy food habits can help reduce 3 risk factors for stroke:  High  cholesterol, hypertension, and excess weight.  RESOURCES Stroke/Support Group:  Call [phone]   STROKE EDUCATION PROVIDED/REVIEWED AND GIVEN TO PATIENT Stroke warning signs and symptoms How to activate emergency medical system (call 911). Medications prescribed at discharge. Need for follow-up after discharge. Personal risk factors for stroke. Pneumonia vaccine given: No Flu vaccine given: No My questions have been answered, the writing is legible, and I understand these instructions.  I will adhere to these goals & educational materials that have been provided to me after my discharge from the hospital.      My questions have been answered and I understand these instructions. I will adhere to these goals and the provided educational materials after my discharge from the hospital.  Patient/Caregiver Signature _______________________________ Date __________  Clinician Signature _______________________________________ Date __________  Please bring this form and your medication list with you to all your follow-up doctor's appointments.

## 2022-06-30 NOTE — Progress Notes (Signed)
Patient ID: Richard Flynn, male   DOB: [birth year], 67 y.o.   MRN: [id number]  OP referral sent to Neuro Rehab

## 2022-06-30 NOTE — Progress Notes (Signed)
Occupational Therapy Discharge Summary  Patient Details  Name: Richard Flynn MRN: [id number] Date of Birth: [birth year]  Date of Discharge from OT service:June 30, 2022  Today's Date: 06/30/2022 OT Individual Time: 1115-1200 OT Individual Time Calculation (min): 45 min  OT Individual Time: 1345-1430 OT Individual Time Calculation (min): 45 min   Patient has met 9 of 9 long term goals due to improved activity tolerance, improved balance, postural control, functional use of  RIGHT upper and RIGHT lower extremity, improved attention, improved awareness, and improved coordination.  Patient to discharge at overall Independent-supervision level.  Patient's care partner is independent to provide the necessary physical and cognitive assistance at discharge.    Reasons goals not met: All goals met   Recommendation:  Patient will benefit from ongoing skilled OT services in outpatient setting to continue to advance functional skills in the area of iADL and Reduce care partner burden.  Equipment: No equipment provided  Reasons for discharge: treatment goals met and discharge from hospital  Patient/family agrees with progress made and goals achieved: Yes  OT Discharge Skilled Therapeutic Interventions/Progress Updates:  AM Session: Pt received sitting up in recliner with wife present in room. Wife and Pt participated in family education-based session. Pt and wife educated on providing supervision with IADL tasks, energy conservation, follow-up therapy services, Pt BADL functional status, and aphasia recovery/activities to complete at home to support recovery. Pt able to complete bathing, dressing, toileting, and grooming tasks independently. Pt supervision for transfers in/out of shower. Pt left resting in recliner with wife present in room with all needs met.   PM Session: Pt received sitting in recliner with wife present in room. Pt in good spirits and receptive to skilled OT session with a focus  on IADL retraining, safety awareness education, and transfer training. Pt reporting 0/10 pain during session.  Pt requested to use restroom at beginning of session. Pt able to complete 3/3 toileting tasks and wash hands in standing independently. Pt independently ambulated down hallway to therapy ADL apartment. Pt completed simulated cooking task using knives to cut play dough and discussed how to safety complete task. Pt educated on kitchen safety and demonstrated learning through teach back verbalizing three strategies to implement at home to increase safety.  Pt practiced transferred in/out of bath tub to tub bench with supervision. Pt educated on bathroom safety verbalizing understanding. Pt ambulated back to room and was left resting in recliner with call bell in reach and all needs met.   Precautions/Restrictions  Precautions Precautions: Fall;Other (comment) Precaution Comments: expressive aphasia Restrictions Weight Bearing Restrictions: No   Pain Pain Assessment Pain Scale: 0-10 Pain Score: 0-No pain ADL ADL Eating: Set up Where Assessed-Eating: Edge of bed Grooming: Independent Where Assessed-Grooming: Standing at sink Upper Body Bathing: Supervision/safety Where Assessed-Upper Body Bathing: Shower Lower Body Bathing: Supervision/safety Where Assessed-Lower Body Bathing: Shower Upper Body Dressing: Independent Where Assessed-Upper Body Dressing: Edge of bed Lower Body Dressing: Independent Where Assessed-Lower Body Dressing: Edge of bed Toileting: Independent Where Assessed-Toileting: Glass blower/designer: Programmer, applications Method: Counselling psychologist: Ambulance person Transfer: Distant supervision Tub/Shower Transfer Method: Optometrist: Civil engineer, contracting with back Social research officer, government: Distant supervision Social research officer, government Method: Heritage manager: Civil engineer, contracting with back Vision Baseline  Vision/History: 1 Wears glasses Patient Visual Report: No change from baseline Vision Assessment?: Yes Eye Alignment: Within Functional Limits Ocular Range of Motion: Within Functional Limits Alignment/Gaze Preference: Within Defined Limits Tracking/Visual Pursuits: Able to track stimulus  in all quads without difficulty Saccades: Within functional limits Convergence: Within functional limits Visual Fields: No apparent deficits Perception  Perception: Within Functional Limits Praxis Praxis: Intact Cognition Cognition Overall Cognitive Status: Difficult to assess (Pt presenting with expressive aphasia; difficult to assess) Arousal/Alertness: Awake/alert Orientation Level: Person;Place;Situation Person: Oriented Place: Oriented Situation: Oriented Memory: Appears intact Memory Impairment: Decreased recall of new information Attention: Focused;Sustained;Selective Focused Attention: Appears intact Sustained Attention: Appears intact Selective Attention: Appears intact Awareness: Impaired Awareness Impairment: Emergent impairment Problem Solving: Appears intact Safety/Judgment: Appears intact Brief Interview for Mental Status (BIMS) Repetition of Three Words (First Attempt): 3 Temporal Orientation: Year: Correct Temporal Orientation: Month: Accurate within 5 days Temporal Orientation: Day: Correct Recall: "Sock": No, could not recall Recall: "Blue": Yes, no cue required Recall: "Bed": Yes, no cue required BIMS Summary Score: 13 Sensation Sensation Light Touch: Appears Intact Hot/Cold: Not tested Proprioception: Appears Intact Stereognosis: Not tested Coordination Gross Motor Movements are Fluid and Coordinated: Yes Fine Motor Movements are Fluid and Coordinated: Yes Motor  Motor Motor: Within Functional Limits Mobility  Bed Mobility Bed Mobility: Supine to Sit;Sit to Supine Supine to Sit: Independent Sit to Supine: Independent Transfers Sit to Stand:  Independent Stand to Sit: Independent  Trunk/Postural Assessment  Cervical Assessment Cervical Assessment: Exceptions to [facility] (mild forward head) Thoracic Assessment Thoracic Assessment: Exceptions to [facility] Lumbar Assessment Lumbar Assessment: Exceptions to [facility] (rounded shoulders) Postural Control Postural Control: Within Functional Limits  Balance Balance Balance Assessed: Yes Static Sitting Balance Static Sitting - Balance Support: Feet supported Static Sitting - Level of Assistance: 7: Independent Dynamic Sitting Balance Dynamic Sitting - Balance Support: Feet supported Dynamic Sitting - Level of Assistance: 7: Independent Dynamic Sitting - Balance Activities: Forward lean/weight shifting;Lateral lean/weight shifting;Reaching for objects;Reaching across midline Static Standing Balance Static Standing - Balance Support: During functional activity Static Standing - Level of Assistance: 7: Independent Dynamic Standing Balance Dynamic Standing - Balance Support: During functional activity Dynamic Standing - Level of Assistance: 7: Independent;5: Stand by assistance Dynamic Standing - Balance Activities: Reaching across midline Extremity/Trunk Assessment RUE Assessment RUE Assessment: Within Functional Limits Passive Range of Motion (PROM) Comments: WFL Active Range of Motion (AROM) Comments: WFL General Strength Comments: Improvement in RUE strength LUE Assessment LUE Assessment: Within Functional Limits Passive Range of Motion (PROM) Comments: WFL Active Range of Motion (AROM) Comments: WFL General Strength Comments: 5/5   Janey Genta 06/30/2022, 12:41 PM

## 2022-07-01 ENCOUNTER — Other Ambulatory Visit (HOSPITAL_COMMUNITY): Payer: Self-pay

## 2022-07-01 DIAGNOSIS — I639 Cerebral infarction, unspecified: Secondary | ICD-10-CM | POA: Diagnosis not present

## 2022-07-01 LAB — POTASSIUM: Potassium: 3.4 mmol/L — ABNORMAL LOW (ref 3.5–5.1)

## 2022-07-01 LAB — GLUCOSE, CAPILLARY: Glucose-Capillary: 146 mg/dL — ABNORMAL HIGH (ref 70–99)

## 2022-07-01 MED ORDER — POTASSIUM CHLORIDE 20 MEQ PO PACK: 20.0000 meq | PACK | Freq: Once | ORAL | Status: DC

## 2022-07-01 MED ORDER — ACCU-CHEK SOFTCLIX LANCETS MISC: 5 refills | Status: DC

## 2022-07-01 MED ORDER — INSULIN GLARGINE-YFGN 100 UNIT/ML ~~LOC~~ SOLN: 10.0000 [IU] | Freq: Every day | SUBCUTANEOUS | 11 refills | Status: AC

## 2022-07-01 MED ORDER — NITROGLYCERIN 0.4 MG SL SUBL: 0.4000 mg | SUBLINGUAL_TABLET | SUBLINGUAL | 3 refills | Status: AC | PRN

## 2022-07-01 NOTE — Progress Notes (Signed)
PROGRESS NOTE   Subjective/Complaints:  Discussed K+ level need for additional oral KCL pror to d/c Also discussed no driving until cleared by MD  ROS-  Pt denies SOB, abd pain, CP, N/V/C/D, and vision changes  Objective:   No results found. Recent Labs    06/29/22 0628  WBC 10.6*  HGB 9.4*  HCT 29.1*  PLT 226    Recent Labs    06/29/22 0628 07/01/22 0522  NA 140  --   K 3.1* 3.4*  CL 110  --   CO2 19*  --   GLUCOSE 153*  --   BUN 37*  --   CREATININE 1.25*  --   CALCIUM 8.1*  --      Intake/Output Summary (Last 24 hours) at 07/01/2022 0850 Last data filed at 07/01/2022 0740 Gross per 24 hour  Intake 900 ml  Output 4 ml  Net 896 ml         Physical Exam: Vital Signs Blood pressure (!) 119/49, pulse 64, temperature 97.7 F (36.5 C), resp. rate 18, height '5\' 5"'$  (1.651 m), weight 75.7 kg, SpO2 99 %.  General: No acute distress Mood and affect are appropriate Heart: Regular rate and rhythm no rubs murmurs or extra sounds Lungs: Clear to auscultation, breathing unlabored, no rales or wheezes Abdomen: Positive bowel sounds, soft nontender to palpation, nondistended Extremities: No clubbing, cyanosis, or edema   Skin: No evidence of breakdown, no evidence of rash Neurologic: Cranial nerves II through XII intact, motor strength is 5/5 in bilateral deltoid, bicep, tricep, grip, hip flexor, knee extensors, ankle dorsiflexor and plantar flexor  Musculoskeletal: Full range of motion in all 4 extremities. No joint swelling   Assessment/Plan: 1. Functional deficits which require 3+ hours per day of interdisciplinary therapy in a comprehensive inpatient rehab setting. Physiatrist is providing close team supervision and 24 hour management of active medical problems listed below. Physiatrist and rehab team continue to assess barriers to discharge/monitor patient progress toward functional and medical  goals  Care Tool:  Bathing    Body parts bathed by patient: Right arm, Right upper leg, Left arm, Left upper leg, Chest, Right lower leg, Abdomen, Front perineal area, Left lower leg, Buttocks, Face         Bathing assist Assist Level: Supervision/Verbal cueing     Upper Body Dressing/Undressing Upper body dressing   What is the patient wearing?: Pull over shirt    Upper body assist Assist Level: Supervision/Verbal cueing    Lower Body Dressing/Undressing Lower body dressing      What is the patient wearing?: Pants     Lower body assist Assist for lower body dressing: Supervision/Verbal cueing     Toileting Toileting    Toileting assist Assist for toileting: Independent     Transfers Chair/bed transfer  Transfers assist     Chair/bed transfer assist level: Independent     Locomotion Ambulation   Ambulation assist      Assist level: Independent Assistive device: No Device Max distance: >181f   Walk 10 feet activity   Assist     Assist level: Independent Assistive device: No Device   Walk 50 feet activity  Assist    Assist level: Independent Assistive device: No Device    Walk 150 feet activity   Assist    Assist level: Independent Assistive device: No Device    Walk 10 feet on uneven surface  activity   Assist     Assist level: Supervision/Verbal cueing     Wheelchair     Assist Is the patient using a wheelchair?: No Type of Wheelchair: Manual    Wheelchair assist level: Moderate Assistance - Patient 50 - 74% Max wheelchair distance: 50 ft    Wheelchair 50 feet with 2 turns activity    Assist        Assist Level: Moderate Assistance - Patient 50 - 74%   Wheelchair 150 feet activity     Assist      Assist Level: Maximal Assistance - Patient 25 - 49%   Blood pressure (!) 119/49, pulse 64, temperature 97.7 F (36.5 C), resp. rate 18, height '5\' 5"'$  (1.651 m), weight 75.7 kg, SpO2 99  %.  Medical Problem List and Plan: 1. 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  Antithrombotics: -DVT/anticoagulation:  Pharmaceutical: Lovenox             -antiplatelet therapy: aspirin and Plavix   3. Pain Management: Tylenol   12/30- denies pain- con't regimen 4. Mood/Behavior/Sleep: LCSW to evaluate and provide emotional support             Having Insomnia- will look into trazodone for sleep             -antipsychotic agents: n/a   5. Neuropsych/cognition: This patient is not? capable of making decisions on his own behalf.   6. Skin/Wound Care: routine skin care checks   7. Fluids/Electrolytes/Nutrition: strict Is and Os and follow-up chemistries             -carb modified diet when advanced             -dysphagia 3 diet/thin liquids             -SLP eval   8: CHF: daily weight             -home Coreg 12.5 mg BID not restarted             -home Entrestorestarted at 24/'26mg'$  home dose              -home Lasix 20 mg , restarted at '10mg'$   Filed Weights   06/29/22 0500 06/30/22 0500 07/01/22 0539  Weight: 77 kg 75.7 kg 75.7 kg    9: Hypertension: monitor TID and prn             -home Coreg, Lasix held   Vitals:   07/01/22 0539 07/01/22 0611  BP: (!) 106/46 (!) 119/49  Pulse: 64   Resp: 18   Temp: 97.7 F (36.5 C)   SpO2: 99%    Restart Entresto, lasix- monitor BP  06/30/22- BP controlled- con't regimen  On lower dose than at home due to low BPs 10: Hyperlipidemia: continue Lipitor 80 mg daily   11: DM  type 2:A1c 6.7-  CBGs q AC and q HS (no new A1c value)             -SSI             -home regimen not restarted: Jardiance 10 mg daily, glipizide 5 mg twice daily,  Lantus 28 units daily, metformin 1000 mg twice daily   CBG (last 3)  Recent Labs    06/30/22 1619 06/30/22 2057 07/01/22 0607  GLUCAP 189* 211* 146*   Restart Jardiance '10mg'$   Insulin glargine 10U qhs restarted 06/30/22 , am CBG ok 12: CKD 3a: serum creatinine trending upward, now 1.49; monitor BMP   12/30- recheck Monday 13: Anemia, normocytic: multifactorial; follow-up CBC   14: Hypokalemia: 3.1 on 06/29/22, on lasix will supplement with KCL 78mq daily , recheck K+ 3.4, give extra 269m KCL today     15: Insomnia: Offer trazodone as needed   12/30- slept well per wife- sounds like has a little hangover this AM? 16: Persistent cough, on oxygen at 2 L via [place]; afebrile, normal WBC             -Duonebs as needed             -Check/update chest x-ray             -Flutter valve, cough and deep breathe             -will wean O2 as tolerated - of note, per wife (-) for COVID/Flu 1+ weeks ago   17: Vaping: +/- nicotine patches- also educated pt on risks of continuing to vape on risk of another stroke, esp when also has DM, CHF, CAD, HTN. And HLD     LOS: 6 days A FACE TO FACE EVALUATION WAS PERFORMED  AnCharlett Blake/08/2022, 8:50 AM

## 2022-07-01 NOTE — Progress Notes (Addendum)
Patient ID: Richard Flynn, male   DOB: 1955-09-16, 67 y.o.   MRN: [id number]  Spouse requesting to have OP referral sent [facility] PT OT SLP [phone].  Per the facility currently they are not accepting outside orders and currently are booked to capacity.   9:43AM: Spouse will consult with PCP to put in referral. Patient is in network with [facility] for primary care. If unable to have referral submitted by PCP Sw will send patient referral to [facility]. Spouse will consult with PCP and follow up with SW. Contact information provided.   3:16 PM: Patient PCP submitted referral to [place] per spouse.

## 2022-07-01 NOTE — Progress Notes (Signed)
Inpatient Rehabilitation Care Coordinator Discharge Note   Patient Details  Name: Richard Flynn MRN: [id number] Date of Birth: Jul 16, 1955   Discharge location: Home  Length of Stay: 6 days  Discharge activity level: ambulatory level independent at household level and Supervision for community without an AD  Home/community participation: spouse  Patient response SW:HQPRFF Literacy - How often do you need to have someone help you when you read instructions, pamphlets, or other written material from your doctor or pharmacy?: Always  Patient response MB:[id number] Isolation - How often do you feel lonely or isolated from those around you?: Never  Services provided included: MD, RD, PT, OT, SLP, RN, CM, TR, Pharmacy, Neuropsych, SW  Financial Services:  Charity fundraiser Utilized: [employer] [facility]  Choices offered to/list presented to: patient  Follow-up services arranged:  Outpatient    Outpatient Servicies: Neuro Rehab      Patient response to transportation need: Is the patient able to respond to transportation needs?: Yes In the past 12 months, has lack of transportation kept you from medical appointments or from getting medications?: No In the past 12 months, has lack of transportation kept you from meetings, work, or from getting things needed for daily living?: No    Comments (or additional information):  Patient/Family verbalized understanding of follow-up arrangements:  Yes  Individual responsible for coordination of the follow-up plan: spouse  Confirmed correct DME delivered: Dyanne Iha 07/01/2022    Dyanne Iha

## 2022-07-01 NOTE — Progress Notes (Signed)
Inpatient Rehab Admissions Coordinator:   Received call from [facility].  Pt's plan has changed on 1/1 and new auth has been built.  Auth number [id number] good through 1/3 and they will review for continued stay.    Shann Medal, PT, DPT Admissions Coordinator [phone] 07/01/22  8:38 AM

## 2022-07-09 NOTE — Progress Notes (Signed)
Cardiology Clinic Note   Patient Name: [name] Date of Encounter: 07/10/2022  Primary Care Provider:  Finis Bud, MD Primary Cardiologist:  Kirk Ruths, MD  Patient Profile    Richard Flynn is a 67 y.o. male with a past medical history of CAD per LHC showing CTO RCA November 2979, chronic systolic heart failure/ischemic cardiomyopathy, PAD history of aortoiliac occlusion, hypertension, hyperlipidemia, CVA, T2DM, CKD stage IIIa, tobacco abuse who presents to the clinic today for hospital follow-up.  Past Medical History    Past Medical History:  Diagnosis Date   CAD (coronary artery disease)    a. [facility] notes suggest cardiac cath 2013 showed occluded proximal RCA with left to right collaterals, 20-40% proximal LAD, 70% distal circumflex artery.   Cardiomyopathy ([place])    Chronic systolic CHF (congestive heart failure) (HCC)    CKD (chronic kidney disease), stage III (HCC)    Diabetes mellitus without complication (HCC)    Hyperlipidemia    Hypertension    NSVT (nonsustained ventricular tachycardia) (HCC)    mentioned in device interrogation   PAD (peripheral artery disease) ([place])     with aortoiliac occlusion (med rx per [facility])   Pancreatitis    Past Surgical History:  Procedure Laterality Date   CHOLECYSTECTOMY     ICD IMPLANT     IR ANGIO INTRA EXTRACRAN SEL COM CAROTID INNOMINATE UNI R MOD SED  06/20/2022   IR CT HEAD LTD  06/20/2022   IR PERCUTANEOUS ART THROMBECTOMY/INFUSION INTRACRANIAL INC DIAG ANGIO  06/20/2022   IR US GUIDE VASC ACCESS RIGHT  06/20/2022   LEFT HEART CATH AND CORONARY ANGIOGRAPHY N/A 06/17/2020   Procedure: LEFT HEART CATH AND CORONARY ANGIOGRAPHY;  Surgeon: Jettie Booze, MD;  Location: [place] [facility];  Service: Cardiovascular;  Laterality: N/A;   LOOP RECORDER INSERTION N/A 06/25/2022   Procedure: LOOP RECORDER INSERTION;  Surgeon: Vickie Epley, MD;  Location: [place] [facility];  Service:  Cardiovascular;  Laterality: N/A;   RADIOLOGY WITH ANESTHESIA N/A 06/20/2022   Procedure: IR WITH ANESTHESIA;  Surgeon: Radiologist, Medication, MD;  Location: [place];  Service: Radiology;  Laterality: N/A;   RIGHT HEART CATH N/A 06/17/2020   Procedure: RIGHT HEART CATH;  Surgeon: Jettie Booze, MD;  Location: [place] [facility];  Service: Cardiovascular;  Laterality: N/A;    Allergies  No Known Allergies  History of Present Illness    [name] has a past medical history of: CAD. LHC 05/02/2012: CTO RCA with left-to-right and right to right collaterals.  Moderate disease in distal Cx. R/LHC 06/17/2020: CTO proximal RCA with left-to-right collaterals.  OM1 50%.  Mild nonobstructive disease left side.  Appears euvolemic by right heart cath.  No significant change from prior cath 2013.  Medical therapy for CAD. Chronic systolic heart failure/Ischemic cardiomyopathy. Echo October 2013 performed at [facility]: EF 22%.  Dilated left and right ventricles.  2+ MR.  Pleural effusion. Patient reported echo 2017 showed EF 30 to 35%. ICD implantation 11/18/2012: [place]. Remote device check 05/14/2022: Normal device function.  Battery and lead parameters stable. Echo 06/20/2022: EF 25 to 30%.  Global hypokinesis.  Moderately dilated left ventricular internal cavity.  Mild LVH.  Grade I DD.  Mildly reduced right ventricular systolic function.  Mild to moderately dilated left atrium.  Trivial MR.  Mild calcification of aortic valve.  PAD.  History of aortic iliac occlusion. Hypertension. Hyperlipidemia. Direct LDL 06/22/2022: 48, TG  134. CVA. CTA head neck 06/20/2022: Emergent large vessel occlusion at the left M1 segment.  There is underfilling of downstream reconstituted vessels.  No proximal flow-limiting stenosis or ulceration in this distribution.  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 IR  percutaneous thrombectomy 06/20/2022: Complete revascularization of occluded left middle cerebral artery. Loop recorder implantation 06/25/2022. T2DM. A1c 06/20/2022: 7.6 CKD stage IIIa. Tobacco abuse. Anemia.  Mr. Moten transferred cardiac care from [facility] to Dr. Stanford Breed on 04/18/2020 for CAD as outlined above.  Patient was last seen in the office on 04/30/2022 for follow-up.  At that time he was doing well.  He felt claudication was somewhat better from previous.  His BP was a little bit soft at 100/68 which is lowest it has been by his report.  He denied dizziness or lightheadedness.  He will continue to monitor BP at home call if trending lower.  Most recently, patient presented to the emergency department on 06/20/2022 for strokelike symptoms.  Patient was in his normal state of health when his wife saw him fall onto the couch.  He was aphasic and had a brief period of unresponsiveness with EMS.  When he regained consciousness the patient continued.  CBG by EMS was 72, normotensive with heart rate in the 60s.  Patient underwent hospital course as outlined above.  Patient was discharged to inpatient rehab on 06/25/2022.  Patient was discharged from inpatient rehab to home on 07/01/2022.  Today, patient is accompanied by his wife.  He is doing well since discharge from hospital.  He continues to have some speech difficulty and is working with speech therapy.  He understands everything that is being said to him but has difficulty getting some words out.  He has no residual weakness.  He is starting to get back to his normal activities and did a lot of walking around the store yesterday.  He states when it warms up he will be outside more. Patient denies shortness of breath or dyspnea on exertion. No chest pain, pressure, or tightness. Denies lower extremity edema, orthopnea, or PND. No palpitations.  He has brisk diuresis on 10 mg lasix. Claudication is at baseline.  He continues to not smoke.  He was  using a nicotine patch but ran out and is deciding if he wants to continue use.  Patient was found to be mildly hypokalemic on 07/01/2022 and was started on p.o. potassium.  He had labs done by his PCP on 07/07/2022 which showed potassium 5.   Home Medications    Current Meds  Medication Sig   Accu-Chek Softclix Lancets lancets Use as directed.   acetaminophen (TYLENOL) 325 MG tablet Take 1-2 tablets (325-650 mg total) by mouth every 4 (four) hours as needed for mild pain.   aspirin EC 81 MG tablet Take 1 tablet (81 mg total) by mouth daily.   atorvastatin (LIPITOR) 80 MG tablet Take 1 tablet (80 mg total) by mouth daily.   clopidogrel (PLAVIX) 75 MG tablet Take 1 tablet (75 mg total) by mouth daily.   empagliflozin (JARDIANCE) 10 MG TABS tablet Take 1 tablet (10 mg total) by mouth daily.   furosemide (LASIX) 20 MG tablet Take 0.5 tablets (10 mg total) by mouth daily.   insulin glargine-yfgn (SEMGLEE) 100 UNIT/ML injection Inject 0.1 mLs (10 Units total) into the skin at bedtime.   nicotine (NICODERM CQ - DOSED IN MG/24 HOURS) 21 mg/24hr patch Place 1 patch (21 mg total) onto the skin  daily.   nitroGLYCERIN (NITROSTAT) 0.4 MG SL tablet Place 1 tablet (0.4 mg total) under the tongue every 5 (five) minutes as needed for chest pain.   potassium chloride SA (KLOR-CON M) 20 MEQ tablet Take 1 tablet (20 mEq total) by mouth daily.   sacubitril-valsartan (ENTRESTO) 24-26 MG Take 1 tablet by mouth 2 (two) times daily.   traZODone (DESYREL) 50 MG tablet Take 0.5-1 tablets (25-50 mg total) by mouth at bedtime as needed for sleep.    Family History    Family History  Problem Relation Age of Onset   CAD Mother    He indicated that his mother is alive. He indicated that his father is deceased.   Social History    Social History   Socioeconomic History   Marital status: Married    Spouse name: Not on file   Number of children: 0   Years of education: Not on file   Highest education level: Not on  file  Occupational History   Not on file  Tobacco Use   Smoking status: Former    Packs/day: 1.00    Years: 50.00    Total pack years: 50.00    Types: Cigarettes    Quit date: 2012    Years since quitting: 12.0   Smokeless tobacco: Never   Tobacco comments:    vape  Vaping Use   Vaping Use: Some days   Substances: Nicotine  Substance and Sexual Activity   Alcohol use: Yes    Comment: 1 beer per month   Drug use: Not Currently   Sexual activity: Yes  Other Topics Concern   Not on file  Social History Narrative   Not on file   Social Determinants of Health   Financial Resource Strain: Not on file  Food Insecurity: No Food Insecurity (06/23/2022)   Hunger Vital Sign    Worried About Running Out of Food in the Last Year: Never true    Ran Out of Food in the Last Year: Never true  Transportation Needs: No Transportation Needs (06/23/2022)   PRAPARE - Hydrologist (Medical): No    Lack of Transportation (Non-Medical): No  Physical Activity: Not on file  Stress: Not on file  Social Connections: Not on file  Intimate Partner Violence: Not At Risk (06/23/2022)   Humiliation, Afraid, Rape, and Kick questionnaire    Fear of Current or Ex-Partner: No    Emotionally Abused: No    Physically Abused: No    Sexually Abused: No     Review of Systems    General:  No chills, fever, night sweats or weight changes.  Cardiovascular:  No chest pain, dyspnea on exertion, edema, orthopnea, palpitations, paroxysmal nocturnal dyspnea. Dermatological: No rash, lesions/masses Respiratory: No cough, dyspnea Urologic: No hematuria, dysuria Abdominal:   No nausea, vomiting, diarrhea, bright red blood per rectum, melena, or hematemesis Neurologic:  No visual changes, weakness, changes in mental status. Mild aphasia.  All other systems reviewed and are otherwise negative except as noted above.  Physical Exam    VS:  BP (!) 110/58   Pulse 73   Ht '5\' 6"'$  (1.676  m)   Wt 163 lb 9.6 oz (74.2 kg)   SpO2 98%   BMI 26.41 kg/m  , BMI Body mass index is 26.41 kg/m. GEN:  Well nourished, well developed, in no acute distress. HEENT: Normal. Neck: Supple, no JVD, carotid bruits, or masses. Cardiac: RRR, no murmurs, rubs, or gallops. No clubbing,  cyanosis, edema.  Radials/DP/PT 2+ and equal bilaterally.  Respiratory:  Respirations regular and unlabored, clear to auscultation bilaterally. GI: Soft, nontender, nondistended. MS: No deformity or atrophy. Skin: Warm and dry, no rash. Neuro: Strength and sensation are intact. Occasional mild aphasia.  Psych: Normal affect.  Accessory Clinical Findings    Recent Labs: 06/26/2022: ALT 23 06/29/2022: BUN 37; Creatinine, Ser 1.25; Hemoglobin 9.4; Platelets 226; Sodium 140 07/01/2022: Potassium 3.4   Recent Lipid Panel    Component Value Date/Time   CHOL 111 06/21/2022 0452   TRIG 134 06/22/2022 0342   HDL 14 (L) 06/21/2022 0452   CHOLHDL 7.9 06/21/2022 0452   VLDL UNABLE TO CALCULATE IF TRIGLYCERIDE OVER 400 mg/dL 06/21/2022 0452   LDLCALC UNABLE TO CALCULATE IF TRIGLYCERIDE OVER 400 mg/dL 06/21/2022 0452   LDLDIRECT 48 06/22/2022 0342       ECG is not indicated today.   Assessment & Plan   Chronic systolic heart failure/ischemic cardiomyopathy.  S/p ICD implantation May 2014.  Remote device check 05/14/2022 showed normal device function.  Echo December 2023 EF 25 to 30%, Grade I DD.  Patient denies shortness of breath, DOE, lower extremity edema, or abdominal bloating/fullness. Brisk diuresis on Lasix 10 mg.  Euvolemic and well compensated on exam. Lungs clear to auscultation. Continue Entresto, Lasix, Jardiance. CAD.  LHC December 2021 showed CTO proximal RCA with left-to-right collaterals, mild nonobstructive disease on the left.  Patient denies chest pain, tightness or pressure. He is slowly getting back to normal activities. Walked around the store recently without difficulty. Continue aspirin,  Plavix, as needed SL NTG. Hypertension.  BP today 110/58. Patient denies dizziness or headaches. He tracks BP at home. Continue Entresto.  Hyperlipidemia.  Direct LDL December 2023 48, at goal.  Continue Lipitor. Recent CVA.  Patient underwent thrombectomy December 2023 with complete revascularization of occluded left middle cerebral artery.  Patient was discharged home from inpatient rehab on 07/01/2022. He has no residual weakness but continues with mild aphasia. He is working with speech therapy. He is A&O x 4 today with clear speech, mild occasional aphasia. Continue aspirin and Plavix. Anemia. CBC from PCP 07/07/2022 11.4 increased from 9.4. Patient denies shortness of breath, weakness or fatigue. He denies blood in stool or urine or other bleeding issues.  Hypokalemia. K 3.4 on 07/01/2022. He was started on oral potassium. Repeat K from PCP on 07/07/2022 5.0. Will repeat BMP today. If K stable will not make any changes. If K >5 will have patient hold potassium and take 20 mg of lasix x 2 days then resume 10 mg of lasix and 10 mEq of potassium thereafter. Recheck BMP in one month.  Tobacco abuse.  Patient is no longer smoking.  He was using nicotine patch but ran out and is unsure if he will continue use.  Encouraged to continue not smoking and to use patch over cigarettes if needed.    Disposition: BMP today. Return in 3 months or sooner as needed.    Justice Britain. Sandar Krinke, DNP, NP-C     07/10/2022, 8:32 AM [place] [address] Office [phone] Fax [phone]

## 2022-07-10 ENCOUNTER — Encounter: Payer: Self-pay | Admitting: Student

## 2022-07-10 ENCOUNTER — Ambulatory Visit: Payer: HMO | Attending: General Practice | Admitting: Student

## 2022-07-10 VITALS — BP 110/58 | HR 73 | Ht 66.0 in | Wt 163.6 lb

## 2022-07-10 DIAGNOSIS — I2510 Atherosclerotic heart disease of native coronary artery without angina pectoris: Secondary | ICD-10-CM

## 2022-07-10 DIAGNOSIS — E876 Hypokalemia: Secondary | ICD-10-CM

## 2022-07-10 DIAGNOSIS — I10 Essential (primary) hypertension: Secondary | ICD-10-CM | POA: Diagnosis not present

## 2022-07-10 DIAGNOSIS — Z8673 Personal history of transient ischemic attack (TIA), and cerebral infarction without residual deficits: Secondary | ICD-10-CM

## 2022-07-10 DIAGNOSIS — D649 Anemia, unspecified: Secondary | ICD-10-CM

## 2022-07-10 DIAGNOSIS — I5022 Chronic systolic (congestive) heart failure: Secondary | ICD-10-CM

## 2022-07-10 DIAGNOSIS — E785 Hyperlipidemia, unspecified: Secondary | ICD-10-CM

## 2022-07-10 DIAGNOSIS — Z720 Tobacco use: Secondary | ICD-10-CM

## 2022-07-10 LAB — BASIC METABOLIC PANEL
BUN/Creatinine Ratio: 18 (ref 10–24)
BUN: 27 mg/dL (ref 8–27)
CO2: 21 mmol/L (ref 20–29)
Calcium: 9.6 mg/dL (ref 8.6–10.2)
Chloride: 103 mmol/L (ref 96–106)
Creatinine, Ser: 1.53 mg/dL — ABNORMAL HIGH (ref 0.76–1.27)
Glucose: 196 mg/dL — ABNORMAL HIGH (ref 70–99)
Potassium: 5.1 mmol/L (ref 3.5–5.2)
Sodium: 140 mmol/L (ref 134–144)
eGFR: 49 mL/min/{1.73_m2} — ABNORMAL LOW (ref >59)

## 2022-07-10 NOTE — Patient Instructions (Signed)
Medication Instructions:  Your physician recommends that you continue on your current medications as directed. Please refer to the Current Medication list given to you today.  *If you need a refill on your cardiac medications before your next appointment, please call your pharmacy*  Lab Work: Your physician recommends that you return for lab work TODAY:  BMP If you have labs (blood work) drawn today and your tests are completely normal, you will receive your results only by: [place] (if you have [place]) OR A paper copy in the mail If you have any lab test that is abnormal or we need to change your treatment, we will call you to review the results.  Testing/Procedures: NONE ordered at this time of appointment   Follow-Up: At [facility], you and your health needs are our priority.  As part of our continuing mission to provide you with exceptional heart care, we have created designated Provider Care Teams.  These Care Teams include your primary Cardiologist (physician) and Advanced Practice Providers (APPs -  Physician Assistants and Nurse Practitioners) who all work together to provide you with the care you need, when you need it.   Your next appointment:   3-4 month(s)  Provider:   Kirk Ruths, MD  or APP    Other Instructions

## 2022-07-13 ENCOUNTER — Telehealth: Payer: Self-pay

## 2022-07-13 DIAGNOSIS — E875 Hyperkalemia: Secondary | ICD-10-CM

## 2022-07-13 NOTE — Telephone Encounter (Addendum)
Left voice message for patient to return call for lab results and medication change. Will try calling again.  ----- Message from Mayra Reel, NP sent at 07/12/2022  4:37 PM EST ----- Please let patient know potassium is on the higher end of normal. Continue current dose of Lasix but decrease potassium to 10 mEq daily. Repeat BMP in one month. Please ask patient to increase hydration.  Thank you! ~DW

## 2022-07-14 MED ORDER — POTASSIUM CHLORIDE CRYS ER 20 MEQ PO TBCR: 10.0000 meq | EXTENDED_RELEASE_TABLET | Freq: Every day | ORAL | 0 refills | Status: DC

## 2022-07-14 NOTE — Telephone Encounter (Signed)
RN spoke to wife and patient .  Aware of medication change - will cut 20 meq in half - take 10 meq daily  Aware to go to  have lab done either 2/15 or 2/16-- will use  lab in [place] ( [facility]) [facility] has closed. Aware to increase oral hydration   Both verbalized understanding

## 2022-07-14 NOTE — Telephone Encounter (Signed)
Pt is returning call. Requesting return call.

## 2022-07-14 NOTE — Addendum Note (Signed)
Addended by: Raiford Simmonds on: 07/14/2022 10:30 AM   Modules accepted: Orders

## 2022-07-27 ENCOUNTER — Ambulatory Visit: Payer: HMO

## 2022-07-29 ENCOUNTER — Encounter: Payer: Self-pay | Admitting: Adult Health

## 2022-07-29 ENCOUNTER — Ambulatory Visit: Payer: PPO | Admitting: Adult Health

## 2022-07-29 VITALS — BP 119/73 | HR 67 | Ht 66.0 in | Wt 166.5 lb

## 2022-07-29 DIAGNOSIS — R0681 Apnea, not elsewhere classified: Secondary | ICD-10-CM | POA: Diagnosis not present

## 2022-07-29 DIAGNOSIS — I63512 Cerebral infarction due to unspecified occlusion or stenosis of left middle cerebral artery: Secondary | ICD-10-CM | POA: Diagnosis not present

## 2022-07-29 DIAGNOSIS — R0683 Snoring: Secondary | ICD-10-CM | POA: Diagnosis not present

## 2022-07-29 NOTE — Patient Instructions (Signed)
Your Plan:  Continue ASA and plavix  Blood pressure goal <130/90 Cholesterol LDL goal <70 Diabetes goal A1c <7 Refferal placed for sleep evaluation  Monitor diet and try to exercise   Thank you for coming to see [place] at [facility]. I hope we have been able to provide you high quality care today.  You may receive a patient satisfaction survey over the next few weeks. We would appreciate your feedback and comments so that we may continue to improve ourselves and the health of our patients.

## 2022-07-29 NOTE — Progress Notes (Signed)
PATIENT: Richard Flynn DOB: 02-Aug-1955  REASON FOR VISIT: follow up HISTORY FROM: patient PRIMARY NEUROLOGIST: Dr. Leonie Man  Chief Complaint  Patient presents with   Follow-up    Pt in 26 with wife Pt here for stroke f/u  Pt states speech is garbled Pt states has speech therapy 2x a week Pt  states little memory loss      HISTORY OF PRESENT ILLNESS: Today 07/29/22  Richard Flynn is a 67 y.o. male who is here today for hospital follow-up for CVA.  Returns today for follow-up.   Still notices some toruble with speech but getting better each day. Had ST twice a week. No weakness that is noticeable. No changes with gait or balance. Not driving currently.  Reports that he does notice fatigue throughout the day but reports he does not sleep well at night.  Wife reports that she has noticed some snoring not sure that he has had true apneic events but does report that there are times he has his mouth open and she feels that he may be struggling to take a breath.  Patient does have a loop recorder with his pacemaker   HISTORY Richard Flynn is a 67 y.o. male with history of CAD, CHF, cardiomyopathy, pacemaker, CKD stage III, diabetes, hypertension, hyperlipidemia and PAD presenting with a sudden collapse with aphasia and right facial droop.  He was noted to be initially hypoglycemic by EMS, but when dextrose was given and blood glucose rose, symptoms remained.  Head CT on arrival to the ED was negative for acute ICH, so TNK was administered.  Patient was then found to have left MCA M1 occlusion on CTA head and was taken to IR for mechanical thrombectomy.  TICI 3 flow was achieved.  Patient has been hemodynamically stable overnight but continues to have some expressive aphasia.    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  Hyperdense left MCA  CTA head & neck  LVO at left M1 segment, high-grade right V1 stenosis Post IR CT no hemorrhage Post IR CTA head and neck left M1 patent  Follow-up CT 12/24 acute infarcts in left MCA and ACA territory MRI unable to perform due to incompatible pacemaker 2D Echo EF 25 to 30%  pacemaker not able to be interrogation to rule out afib, only v lead and will not be able to detect A fib  Per card, will place loop recorder LDL 48 HgbA1c pending UDS neg aspirin 81 mg daily prior to admission, now on ASA and plavix DAPT. Therapy recommendations: CIR Disposition: CIR today  REVIEW OF SYSTEMS: Out of a complete 14 system review of symptoms, the patient complains only of the following symptoms, and all other reviewed systems are negative.  ALLERGIES: No Known Allergies  HOME MEDICATIONS: Outpatient Medications Prior to Visit  Medication Sig Dispense Refill   Accu-Chek Softclix Lancets lancets Use as directed. 100 each 5   acetaminophen (TYLENOL) 325 MG tablet Take 1-2 tablets (325-650 mg total) by mouth every 4 (four) hours as needed for mild pain.     Antarctic Krill Oil 500 MG CAPS Take by mouth daily.     aspirin EC 81 MG tablet Take 1 tablet (81 mg total) by mouth daily. 30 tablet 1   atorvastatin (LIPITOR) 80 MG tablet Take 1 tablet (80 mg total) by mouth  daily. 30 tablet 0   clopidogrel (PLAVIX) 75 MG tablet Take 1 tablet (75 mg total) by mouth daily. 30 tablet 1   empagliflozin (JARDIANCE) 10 MG TABS tablet Take 1 tablet (10 mg total) by mouth daily. 30 tablet 0   furosemide (LASIX) 20 MG tablet Take 0.5 tablets (10 mg total) by mouth daily. 15 tablet 0   insulin glargine-yfgn (SEMGLEE) 100 UNIT/ML injection Inject 0.1 mLs (10 Units total) into the skin at bedtime. (Patient taking differently: Inject 15 Units into the skin at bedtime.) 10 mL 11   nitroGLYCERIN (NITROSTAT) 0.4 MG SL tablet Place 1 tablet (0.4 mg total) under the tongue every 5 (five) minutes as needed for chest pain. 25 tablet 3   potassium  chloride SA (KLOR-CON M) 20 MEQ tablet Take 0.5 tablets (10 mEq total) by mouth daily. 30 tablet 0   traZODone (DESYREL) 50 MG tablet Take 0.5-1 tablets (25-50 mg total) by mouth at bedtime as needed for sleep. 30 tablet 0   nicotine (NICODERM CQ - DOSED IN MG/24 HOURS) 21 mg/24hr patch Place 1 patch (21 mg total) onto the skin daily. 28 patch 0   sacubitril-valsartan (ENTRESTO) 24-26 MG Take 1 tablet by mouth 2 (two) times daily. 60 tablet 0   No facility-administered medications prior to visit.    PAST MEDICAL HISTORY: Past Medical History:  Diagnosis Date   CAD (coronary artery disease)    a. [facility] notes suggest cardiac cath 2013 showed occluded proximal RCA with left to right collaterals, 20-40% proximal LAD, 70% distal circumflex artery.   Cardiomyopathy ([place])    Chronic systolic CHF (congestive heart failure) (HCC)    CKD (chronic kidney disease), stage III (HCC)    Diabetes mellitus without complication (HCC)    Hyperlipidemia    Hypertension    NSVT (nonsustained ventricular tachycardia) (HCC)    mentioned in device interrogation   PAD (peripheral artery disease) ([place])     with aortoiliac occlusion (med rx per [facility])   Pancreatitis     PAST SURGICAL HISTORY: Past Surgical History:  Procedure Laterality Date   CHOLECYSTECTOMY     ICD IMPLANT     IR ANGIO INTRA EXTRACRAN SEL COM CAROTID INNOMINATE UNI R MOD SED  06/20/2022   IR CT HEAD LTD  06/20/2022   IR PERCUTANEOUS ART THROMBECTOMY/INFUSION INTRACRANIAL INC DIAG ANGIO  06/20/2022   IR US GUIDE VASC ACCESS RIGHT  06/20/2022   LEFT HEART CATH AND CORONARY ANGIOGRAPHY N/A 06/17/2020   Procedure: LEFT HEART CATH AND CORONARY ANGIOGRAPHY;  Surgeon: Jettie Booze, MD;  Location: [place] [facility];  Service: Cardiovascular;  Laterality: N/A;   LOOP RECORDER INSERTION N/A 06/25/2022   Procedure: LOOP RECORDER INSERTION;  Surgeon: Vickie Epley, MD;  Location: [place] [facility];  Service:  Cardiovascular;  Laterality: N/A;   RADIOLOGY WITH ANESTHESIA N/A 06/20/2022   Procedure: IR WITH ANESTHESIA;  Surgeon: Radiologist, Medication, MD;  Location: [place];  Service: Radiology;  Laterality: N/A;   RIGHT HEART CATH N/A 06/17/2020   Procedure: RIGHT HEART CATH;  Surgeon: Jettie Booze, MD;  Location: [place] [facility];  Service: Cardiovascular;  Laterality: N/A;    FAMILY HISTORY: Family History  Problem Relation Age of Onset   CAD Mother    Stroke Father     SOCIAL HISTORY: Social History   Socioeconomic History   Marital status: Married    Spouse name: Not on file   Number of children: 0   Years  of education: Not on file   Highest education level: Not on file  Occupational History   Not on file  Tobacco Use   Smoking status: Former    Packs/day: 1.00    Years: 50.00    Total pack years: 50.00    Types: Cigarettes    Quit date: 2012    Years since quitting: 12.0   Smokeless tobacco: Never   Tobacco comments:    vape  Vaping Use   Vaping Use: Some days   Substances: Nicotine  Substance and Sexual Activity   Alcohol use: Not Currently    Comment: 1 beer per month   Drug use: Not Currently   Sexual activity: Yes  Other Topics Concern   Not on file  Social History Narrative   Not on file   Social Determinants of Health   Financial Resource Strain: Not on file  Food Insecurity: No Food Insecurity (06/23/2022)   Hunger Vital Sign    Worried About Running Out of Food in the Last Year: Never true    Ran Out of Food in the Last Year: Never true  Transportation Needs: No Transportation Needs (06/23/2022)   PRAPARE - Hydrologist (Medical): No    Lack of Transportation (Non-Medical): No  Physical Activity: Not on file  Stress: Not on file  Social Connections: Not on file  Intimate Partner Violence: Not At Risk (06/23/2022)   Humiliation, Afraid, Rape, and Kick questionnaire    Fear of Current or Ex-Partner: No     Emotionally Abused: No    Physically Abused: No    Sexually Abused: No      PHYSICAL EXAM  Vitals:   07/29/22 0856  BP: 119/73  Pulse: 67  Weight: 166 lb 8 oz (75.5 kg)  Height: '5\' 6"'$  (1.676 m)   Body mass index is 26.87 kg/m.  Generalized: Well developed, in no acute distress   Neurological examination  Mentation: Alert oriented to time, place, history taking. Follows all commands.  Mild expressive aphasia slightly dysarthric Cranial nerve II-XII: Pupils were equal round reactive to light. Extraocular movements were full, visual field were full on confrontational test. Facial sensation and strength were normal. Uvula tongue midline. Head turning and shoulder shrug  were normal and symmetric. Motor: The motor testing reveals 5 over 5 strength of all 4 extremities. Good symmetric motor tone is noted throughout.  Sensory: Sensory testing is intact to soft touch on all 4 extremities. No evidence of extinction is noted.  Coordination: Cerebellar testing reveals good finger-nose-finger and heel-to-shin bilaterally.  Gait and station: Gait is normal.   DIAGNOSTIC DATA (LABS, IMAGING, TESTING) - I reviewed patient records, labs, notes, testing and imaging myself where available.  Lab Results  Component Value Date   WBC 10.6 (H) 06/29/2022   HGB 9.4 (L) 06/29/2022   HCT 29.1 (L) 06/29/2022   MCV 92.4 06/29/2022   PLT 226 06/29/2022      Component Value Date/Time   NA 140 07/10/2022 0907   K 5.1 07/10/2022 0907   CL 103 07/10/2022 0907   CO2 21 07/10/2022 0907   GLUCOSE 196 (H) 07/10/2022 0907   GLUCOSE 153 (H) 06/29/2022 0628   BUN 27 07/10/2022 0907   CREATININE 1.53 (H) 07/10/2022 0907   CALCIUM 9.6 07/10/2022 0907   PROT 6.5 06/26/2022 0556   ALBUMIN 2.5 (L) 06/26/2022 0556   AST 24 06/26/2022 0556   ALT 23 06/26/2022 0556   ALKPHOS 68 06/26/2022 0556  BILITOT 1.3 (H) 06/26/2022 0556   GFRNONAA >60 06/29/2022 0628   GFRAA 53 (L) 07/18/2020 1156   Lab Results   Component Value Date   CHOL 111 06/21/2022   HDL 14 (L) 06/21/2022   LDLCALC UNABLE TO CALCULATE IF TRIGLYCERIDE OVER 400 mg/dL 06/21/2022   LDLDIRECT 48 06/22/2022   TRIG 134 06/22/2022   CHOLHDL 7.9 06/21/2022   Lab Results  Component Value Date   HGBA1C 7.6 (H) 06/20/2022   No results found for: "VITAMINB12" No results found for: "TSH"    ASSESSMENT AND PLAN 67 y.o. year old male  has a past medical history of CAD (coronary artery disease), Cardiomyopathy ([place]), Chronic systolic CHF (congestive heart failure) ([place]), CKD (chronic kidney disease), stage III ([place]), Diabetes mellitus without complication ([place]), Hyperlipidemia, Hypertension, NSVT (nonsustained ventricular tachycardia) ([place]), PAD (peripheral artery disease) ([place]), and Pancreatitis. here with:  Stroke: right MCA M1 occlusion status post TNK and IR with TICI3, etiology likely due to cardiomyopathy with low EF versus occult A-fib     Continue clopidogrel 75 mg daily  and ASA 81 mg  for secondary stroke prevention.  Discussed secondary stroke prevention measures and importance of close PCP follow up for aggressive stroke risk factor management. I have gone over the pathophysiology of stroke, warning signs and symptoms, risk factors and their management in some detail with instructions to go to the closest emergency room for symptoms of concern. HTN: BP goal <130/90.  Managed by cardiology HLD: LDL goal <70. Recent LDL 48.  DMII: A1c goal<7.0. Recent A1c 7.6.  Encouraged patient to monitor diet and encouraged exercise Referral for sleep evaluation due to history of stroke, cardiac history FU with our office 6 months      Ward Givens, MSN, NP-C 07/29/2022, 9:02 AM [facility] [address] [place], [place] [address] [phone]

## 2022-07-31 ENCOUNTER — Encounter: Payer: PPO | Attending: Physical Medicine & Rehabilitation | Admitting: Physical Medicine & Rehabilitation

## 2022-07-31 ENCOUNTER — Encounter: Payer: Self-pay | Admitting: Physical Medicine & Rehabilitation

## 2022-07-31 VITALS — Ht 66.0 in | Wt 164.0 lb

## 2022-07-31 DIAGNOSIS — I69320 Aphasia following cerebral infarction: Secondary | ICD-10-CM | POA: Insufficient documentation

## 2022-07-31 NOTE — Progress Notes (Signed)
Subjective:    Patient ID: Richard Flynn, male    DOB: September 08, 1955, 67 y.o.   MRN: [id number]  HPI   Pain Inv68 y.o. male who presented to the emergency department on 06/20/2022 via EMS for evaluation of acute onset of right-sided weakness.  The patient woke up in his usual state of health that morning and was making coffee with his wife.  His wife watched the patient walk into the living room when he suddenly collapsed and rolled onto the couch and was unable to speak.  He demonstrated right facial droop.  Upon arrival to the emergency department, he had a period of unresponsiveness requiring bag ventilation.  He was administered D10 for blood glucose of 62 with improvement to 154.  Code stroke called and neurology evaluated.  Stat CT head was obtained without evidence of ICH.  His last known well was at 8:30 AM.  He was in the window for tenecteplase and this was administered at 10:10 AM.  Vessel imaging revealed emergent large vessel occlusion with left MCA M1 occlusion and IR was activated.  Underwent thrombectomy by Dr. Estanislado Pandy.  After the procedure, he was extubated but had mild respiratory distress and critical care team evaluated him at the bedside.  He did not require intubation or critical care management. Stat CT of the head was performed later that afternoon due to change in neurostatus.  This was negative for any malignant edema or significant midline shift.  CT a of head and neck were then performed.  This was reviewed and negative for any emergent LVO.  Due to status of MRI incompatible pacemaker, the patient underwent follow-up CT head on 12/24.  This revealed the left M1 to be patent.  His expressive aphasia persists.  DAPT initiated and patient now on aspirin and Plavix.  Pacemaker interrogation recommended to rule out atrial fibrillation.  Potassium supplement given for hypokalemia.  2D echo reveals ejection fraction estimated at 25 to 30%.  His home Entresto and Coreg were held.  His  pacemaker is not able to be interrogated therefore electrophysiology consultation was obtained and loop recorder was placed 12/28.  Sliding scale insulin use to manage type 2 diabetes mellitus and his Lipitor was continued.  Is tolerating dysphagia 3 diet with thin liquids.    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 20 ability to climb steps?  yes do you drive?  no Do you have any goals in this area?  yes  Function retired  Neuro/Psych No problems in this area  Prior Studies Any changes since last visit?  no  Physicians involved in your care Any changes since last visit?  no   Family History  Problem Relation Age of Onset   CAD Mother    Stroke Father    Social History   Socioeconomic History   Marital status: Married    Spouse name: Not on file   Number of children: 0   Years of education: Not on file   Highest education level: Not on file  Occupational History  Not on file  Tobacco Use   Smoking status: Former    Packs/day: 1.00    Years: 50.00    Total pack years: 50.00    Types: Cigarettes    Quit date: 2012    Years since quitting: 12.0   Smokeless tobacco: Never   Tobacco comments:    vape  Vaping Use   Vaping Use: Former  Substance and Sexual Activity   Alcohol use: Not Currently    Comment: 1 beer per month   Drug use: Not Currently   Sexual activity: Yes  Other Topics Concern   Not on file  Social History Narrative   Not on file   Social Determinants of Health   Financial Resource Strain: Not on file  Food Insecurity: No Food Insecurity (06/23/2022)    Hunger Vital Sign    Worried About Running Out of Food in the Last Year: Never true    [place] in the Last Year: Never true  Transportation Needs: No Transportation Needs (06/23/2022)   PRAPARE - Hydrologist (Medical): No    Lack of Transportation (Non-Medical): No  Physical Activity: Not on file  Stress: Not on file  Social Connections: Not on file   Past Surgical History:  Procedure Laterality Date   CHOLECYSTECTOMY     ICD IMPLANT     IR ANGIO INTRA EXTRACRAN SEL COM CAROTID INNOMINATE UNI R MOD SED  06/20/2022   IR CT HEAD LTD  06/20/2022   IR PERCUTANEOUS ART THROMBECTOMY/INFUSION INTRACRANIAL INC DIAG ANGIO  06/20/2022   IR US GUIDE VASC ACCESS RIGHT  06/20/2022   LEFT HEART CATH AND CORONARY ANGIOGRAPHY N/A 06/17/2020   Procedure: LEFT HEART CATH AND CORONARY ANGIOGRAPHY;  Surgeon: Jettie Booze, MD;  Location: [place] [facility];  Service: Cardiovascular;  Laterality: N/A;   LOOP RECORDER INSERTION N/A 06/25/2022   Procedure: LOOP RECORDER INSERTION;  Surgeon: Vickie Epley, MD;  Location: [place] [facility];  Service: Cardiovascular;  Laterality: N/A;   RADIOLOGY WITH ANESTHESIA N/A 06/20/2022   Procedure: IR WITH ANESTHESIA;  Surgeon: Radiologist, Medication, MD;  Location: [place];  Service: Radiology;  Laterality: N/A;   RIGHT HEART CATH N/A 06/17/2020   Procedure: RIGHT HEART CATH;  Surgeon: Jettie Booze, MD;  Location: [place] [facility];  Service: Cardiovascular;  Laterality: N/A;   Past Medical History:  Diagnosis Date   CAD (coronary artery disease)    a. [facility] notes suggest cardiac cath 2013 showed occluded proximal RCA with left to right collaterals, 20-40% proximal LAD, 70% distal circumflex artery.   Cardiomyopathy ([place])    Chronic systolic CHF (congestive heart failure) (HCC)    CKD (chronic kidney disease), stage III (HCC)    Diabetes mellitus without complication (HCC)    Hyperlipidemia     Hypertension    NSVT (nonsustained ventricular tachycardia) ([place])    mentioned in device interrogation   PAD (peripheral artery disease) ([place])     with aortoiliac occlusion (med rx per [facility])   Pancreatitis    Ht '5\' 6"'$  (1.676 m)   Wt 164 lb (74.4 kg)   BMI 26.47 kg/m   Opioid Risk Score:   Fall Risk Score:  `1  Depression screen [facility] 2/9     07/31/2022   11:12 AM  Depression screen PHQ 2/9  Decreased Interest 0  Down, Depressed, Hopeless 0  PHQ - 2 Score 0  Altered sleeping 0  Tired, decreased  energy 0  Change in appetite 0  Feeling bad or failure about yourself  0  Trouble concentrating 0  Moving slowly or fidgety/restless 0  Suicidal thoughts 0  PHQ-9 Score 0    Review of Systems  Neurological:  Positive for speech difficulty.  All other systems reviewed and are negative.      Objective:   Physical Exam Vitals and nursing note reviewed.  Constitutional:      Appearance: Normal appearance.  HENT:     Head: Normocephalic and atraumatic.  Eyes:     Extraocular Movements: Extraocular movements intact.     Conjunctiva/sclera: Conjunctivae normal.     Pupils: Pupils are equal, round, and reactive to light.  Musculoskeletal:     Comments: Full range of motion bilateral upper and lower limbs.  Skin:    General: Skin is warm and dry.  Neurological:     Mental Status: He is alert and oriented to person, place, and time.     Cranial Nerves: No dysarthria or facial asymmetry.     Sensory: Sensation is intact.     Motor: Motor function is intact. No tremor or abnormal muscle tone.     Coordination: Romberg sign negative. Coordination normal. Rapid alternating movements normal.     Gait: Gait is intact.     Comments: 5/5 strength bilateral deltoid, bicep, tricep, grip, hip flexor, knee extensor, ankle dorsiflexor and plantar flexor Negative straight leg raising bilaterally   Psychiatric:        Mood and Affect: Mood normal.        Behavior: Behavior  normal.           Assessment & Plan:

## 2022-07-31 NOTE — Patient Instructions (Signed)

## 2022-08-13 ENCOUNTER — Other Ambulatory Visit: Payer: Self-pay | Admitting: Cardiology

## 2022-08-13 ENCOUNTER — Ambulatory Visit (INDEPENDENT_AMBULATORY_CARE_PROVIDER_SITE_OTHER): Payer: PPO

## 2022-08-13 DIAGNOSIS — I63512 Cerebral infarction due to unspecified occlusion or stenosis of left middle cerebral artery: Secondary | ICD-10-CM

## 2022-08-14 LAB — CUP PACEART REMOTE DEVICE CHECK
Battery Remaining Longevity: 54 mo
Battery Remaining Percentage: 58 %
Brady Statistic RV Percent Paced: 0 %
Date Time Interrogation Session: 20240215030100
HighPow Impedance: 66 Ohm
Implantable Lead Connection Status: 753985
Implantable Lead Implant Date: 20140523
Implantable Lead Location: 753860
Implantable Lead Model: 296
Implantable Lead Serial Number: 305696
Implantable Pulse Generator Implant Date: 20140523
Lead Channel Impedance Value: 547 Ohm
Lead Channel Pacing Threshold Amplitude: 0.5 V
Lead Channel Pacing Threshold Pulse Width: 0.4 ms
Lead Channel Setting Pacing Amplitude: 2 V
Lead Channel Setting Pacing Pulse Width: 0.4 ms
Lead Channel Setting Sensing Sensitivity: 0.6 mV
Pulse Gen Serial Number: 100436

## 2022-08-14 LAB — BASIC METABOLIC PANEL
BUN/Creatinine Ratio: 13 (ref 10–24)
BUN: 17 mg/dL (ref 8–27)
CO2: 21 mmol/L (ref 20–29)
Calcium: 9.6 mg/dL (ref 8.6–10.2)
Chloride: 97 mmol/L (ref 96–106)
Creatinine, Ser: 1.33 mg/dL — ABNORMAL HIGH (ref 0.76–1.27)
Glucose: 168 mg/dL — ABNORMAL HIGH (ref 70–99)
Potassium: 4.3 mmol/L (ref 3.5–5.2)
Sodium: 138 mmol/L (ref 134–144)
eGFR: 58 mL/min/{1.73_m2} — ABNORMAL LOW (ref >59)

## 2022-08-27 ENCOUNTER — Ambulatory Visit: Payer: HMO

## 2022-09-10 NOTE — Progress Notes (Signed)
Remote ICD transmission.   

## 2022-09-11 ENCOUNTER — Ambulatory Visit: Payer: PPO

## 2022-09-11 DIAGNOSIS — I255 Ischemic cardiomyopathy: Secondary | ICD-10-CM

## 2022-09-11 LAB — CUP PACEART REMOTE DEVICE CHECK
Date Time Interrogation Session: 20240315093600
Implantable Pulse Generator Implant Date: 20231228
Pulse Gen Serial Number: 102318

## 2022-09-12 ENCOUNTER — Other Ambulatory Visit: Payer: Self-pay | Admitting: Cardiology

## 2022-09-13 ENCOUNTER — Other Ambulatory Visit: Payer: Self-pay | Admitting: Cardiology

## 2022-09-13 DIAGNOSIS — E7800 Pure hypercholesterolemia, unspecified: Secondary | ICD-10-CM

## 2022-09-28 ENCOUNTER — Ambulatory Visit: Payer: HMO

## 2022-10-02 ENCOUNTER — Other Ambulatory Visit: Payer: Self-pay

## 2022-10-02 NOTE — Patient Outreach (Signed)
First telephone outreach attempt to obtain mRS. No answer. Left message for returned call.  Monetta Lick [facility]-Care Management Assistant [phone]  

## 2022-10-05 ENCOUNTER — Other Ambulatory Visit: Payer: Self-pay

## 2022-10-05 NOTE — Patient Outreach (Signed)
Second telephone outreach attempt to obtain mRS. No answer. Left message for returned call.  Rodger Giangregorio [facility]-Care Management Assistant [phone]  

## 2022-10-05 NOTE — Patient Outreach (Signed)
Patient called back, obtained mRS successfully. MRS= 1  Richard Flynn [facility] Care Management Assistant [phone]

## 2022-10-07 ENCOUNTER — Telehealth: Payer: Self-pay | Admitting: *Deleted

## 2022-10-07 NOTE — Telephone Encounter (Signed)
Patient has been approved for assistance for entresto.

## 2022-10-11 NOTE — Progress Notes (Unsigned)
Cardiology Clinic Note   Date: 10/14/2022 ID: BURNETT SISTARE, DOB [birth year], MRN [id number]  Primary Cardiologist:  Olga Millers, MD  Patient Profile    Richard Flynn is a 67 y.o. male who presents to the clinic today for follow-up.   Past medical history significant for: CAD. LHC 05/02/2012: CTO RCA with left-to-right and right to right collaterals.  Moderate disease in distal Cx. R/LHC 06/17/2020: CTO proximal RCA with left-to-right collaterals.  OM1 50%.  Mild nonobstructive disease left side.  Appears euvolemic by right heart cath.  No significant change from prior cath 2013.  Medical therapy for CAD. Chronic systolic heart failure/Ischemic cardiomyopathy. Echo October 2013 performed at [facility]: EF 22%.  Dilated left and right ventricles.  2+ MR.  Pleural effusion. Patient reported echo 2017 showed EF 30 to 35%. ICD implantation 11/18/2012: [employer] ICD. Remote device check 08/13/2022: Normal function.  2 short NSVT arrhythmias detected.  Battery lead parameters stable. Echo 06/20/2022: EF 25 to 30%.  Global hypokinesis.  Moderately dilated left ventricular internal cavity.  Mild LVH.  Grade I DD.  Mildly reduced right ventricular systolic function.  Mild to moderately dilated left atrium.  Trivial MR.  Mild calcification of aortic valve.  PAD.  History of aortic iliac occlusion. Hypertension. Hyperlipidemia. Direct LDL 06/22/2022: 48, TG 134. CVA. CTA head neck 06/20/2022: Emergent large vessel occlusion at the left M1 segment.  There is underfilling of downstream reconstituted vessels.  No proximal flow-limiting stenosis or ulceration in this distribution.  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 IR percutaneous thrombectomy 06/20/2022: Complete revascularization of occluded left middle cerebral artery. Loop recorder implantation 06/25/2022. Remote check 10/13/2022 (not reviewed by MD): No new  symptoms, tachycardia, bradycardia, pause episodes.  No new AF episodes.  18 new false AF events, EGMs show SR with frequent ectopy. Change programming from balanced to less LA.  T2DM. A1c 06/20/2022: 7.6 CKD stage IIIa. Tobacco abuse. Anemia.   History of Present Illness    Richard Flynn ransferred cardiac care from [facility] to Dr. Jens Som on 04/18/2020 for CAD as outlined above.  Patient was last seen in the office by me on 07/10/2022 after being discharged from inpatient rehab following a stroke in December 2023.  At that time he was doing well overall with some continued speech difficulty for which she was working with speech therapy.  Patient had been able to understand everything that was being said to but was having difficulty getting words out.  He had no residual weakness.  He had resumed most of his normal activities.    Today, patient is alone. He is doing very well. Patient denies shortness of breath or dyspnea on exertion. No chest pain, pressure, or tightness. Denies lower extremity edema, orthopnea, or PND. No palpitations. His speech is greatly improved since the last time I saw him. He continues to have some mild word finding issues but overall he is just about back to normal. His energy is almost to baseline. He has resumed his normal activities such as playing golf, fishing, and attending [employer].      ROS: All other systems reviewed and are otherwise negative except as noted in History of Present Illness.  Studies Reviewed    ECG is not ordered today.          Physical Exam    VS:  BP 122/66   Pulse 65   Ht 5\' 6"  (1.676 m)   Wt  164 lb (74.4 kg)   SpO2 99%   BMI 26.47 kg/m  , BMI Body mass index is 26.47 kg/m.  GEN: Well nourished, well developed, in no acute distress. Neck: No JVD or carotid bruits. Cardiac:  RRR. No murmurs. No rubs or gallops.   Respiratory:  Respirations regular and unlabored. Clear to auscultation without rales, wheezing or  rhonchi. GI: Soft, nontender, nondistended. Extremities: Radials/DP/PT 2+ and equal bilaterally. No clubbing or cyanosis. No edema.  Skin: Warm and dry, no rash. Neuro: Strength intact.  Assessment & Plan   Chronic systolic heart failure/ischemic cardiomyopathy.  S/p ICD implantation May 2014.  Remote device check 02/16/2023 showed normal device function.  Echo December 2023 EF 25 to 30%, Grade I DD.  Patient denies shortness of breath, DOE, lower extremity edema, or abdominal bloating/fullness. Brisk diuresis on Lasix 10 mg.  Euvolemic and well compensated on exam. Lungs clear to auscultation. Continue Entresto, Lasix, Jardiance. CAD.  LHC December 2021 showed CTO proximal RCA with left-to-right collaterals, mild nonobstructive disease on the left.  Patient denies chest pain, tightness or pressure. Continue aspirin, Plavix, as needed SL NTG. Hypertension.  BP today 122/66. Patient denies dizziness or headaches.  Continue Entresto.  Hyperlipidemia.  Direct LDL December 2023 48, at goal.  Continue Lipitor. CVA.  Patient underwent thrombectomy December 2023 with complete revascularization of occluded left middle cerebral artery. S/p loop recorder insertion 08/13/2022. Preliminary device check April 2024 showed 18 false AF readings that were SR with frequent ectopy. Patient denies palpitations.  Patient had several sessions with speech therapy and is much improved. He rare occasions of issues with word finding but is otherwise just about back to baseline. He is A&O x 4 today with clear speech, no aphasia today. No ectopy auscultated today. Continue aspirin and Plavix.  Disposition: Return to see Dr. Jens Som in 6 months as previously planned or sooner as needed.          Signed, Richard Flynn. Zacharee Gaddie, DNP, NP-C

## 2022-10-12 ENCOUNTER — Ambulatory Visit (INDEPENDENT_AMBULATORY_CARE_PROVIDER_SITE_OTHER): Payer: PPO

## 2022-10-12 DIAGNOSIS — I255 Ischemic cardiomyopathy: Secondary | ICD-10-CM

## 2022-10-13 NOTE — Progress Notes (Signed)
Carelink Summary Report / Loop Recorder 

## 2022-10-14 ENCOUNTER — Ambulatory Visit: Payer: PPO | Attending: Student | Admitting: Student

## 2022-10-14 ENCOUNTER — Encounter: Payer: Self-pay | Admitting: Student

## 2022-10-14 VITALS — BP 122/66 | HR 65 | Ht 66.0 in | Wt 164.0 lb

## 2022-10-14 DIAGNOSIS — Z95810 Presence of automatic (implantable) cardiac defibrillator: Secondary | ICD-10-CM

## 2022-10-14 DIAGNOSIS — I10 Essential (primary) hypertension: Secondary | ICD-10-CM | POA: Diagnosis not present

## 2022-10-14 DIAGNOSIS — Z8673 Personal history of transient ischemic attack (TIA), and cerebral infarction without residual deficits: Secondary | ICD-10-CM

## 2022-10-14 DIAGNOSIS — I2510 Atherosclerotic heart disease of native coronary artery without angina pectoris: Secondary | ICD-10-CM | POA: Diagnosis not present

## 2022-10-14 DIAGNOSIS — I5022 Chronic systolic (congestive) heart failure: Secondary | ICD-10-CM | POA: Diagnosis not present

## 2022-10-14 DIAGNOSIS — I255 Ischemic cardiomyopathy: Secondary | ICD-10-CM | POA: Diagnosis not present

## 2022-10-14 LAB — CUP PACEART REMOTE DEVICE CHECK
Date Time Interrogation Session: 20240416204100
Implantable Pulse Generator Implant Date: 20231228
Pulse Gen Serial Number: 102318

## 2022-10-14 NOTE — Patient Instructions (Signed)
Medication Instructions:  Your physician recommends that you continue on your current medications as directed. Please refer to the Current Medication list given to you today.  *If you need a refill on your cardiac medications before your next appointment, please call your pharmacy*   Lab Work: NONE If you have labs (blood work) drawn today and your tests are completely normal, you will receive your results only by: MyChart Message (if you have MyChart) OR A paper copy in the mail If you have any lab test that is abnormal or we need to change your treatment, we will call you to review the results.   Testing/Procedures: NONE   Follow-Up: At [facility], you and your health needs are our priority.  As part of our continuing mission to provide you with exceptional heart care, we have created designated Provider Care Teams.  These Care Teams include your primary Cardiologist (physician) and Advanced Practice Providers (APPs -  Physician Assistants and Nurse Practitioners) who all work together to provide you with the care you need, when you need it.  We recommend signing up for the patient portal called "MyChart".  Sign up information is provided on this After Visit Summary.  MyChart is used to connect with patients for Virtual Visits (Telemedicine).  Patients are able to view lab/test results, encounter notes, upcoming appointments, etc.  Non-urgent messages can be sent to your provider as well.   To learn more about what you can do with MyChart, go to [email or web address].    Your next appointment:   Please keep upcoming appointment as scheduled.

## 2022-10-28 ENCOUNTER — Other Ambulatory Visit: Payer: Self-pay

## 2022-10-28 MED ORDER — FUROSEMIDE 20 MG PO TABS: 10.0000 mg | ORAL_TABLET | Freq: Every day | ORAL | 3 refills | Status: DC

## 2022-10-29 ENCOUNTER — Ambulatory Visit: Payer: HMO

## 2022-11-12 ENCOUNTER — Ambulatory Visit (INDEPENDENT_AMBULATORY_CARE_PROVIDER_SITE_OTHER): Payer: PPO

## 2022-11-12 DIAGNOSIS — I255 Ischemic cardiomyopathy: Secondary | ICD-10-CM

## 2022-11-12 LAB — CUP PACEART REMOTE DEVICE CHECK
Battery Remaining Longevity: 54 mo
Battery Remaining Percentage: 55 %
Brady Statistic RV Percent Paced: 0 %
Date Time Interrogation Session: 20240516030100
HighPow Impedance: 68 Ohm
Implantable Lead Connection Status: 753985
Implantable Lead Implant Date: 20140523
Implantable Lead Location: 753860
Implantable Lead Model: 296
Implantable Lead Serial Number: 305696
Implantable Pulse Generator Implant Date: 20140523
Lead Channel Impedance Value: 524 Ohm
Lead Channel Pacing Threshold Amplitude: 0.5 V
Lead Channel Pacing Threshold Pulse Width: 0.4 ms
Lead Channel Setting Pacing Amplitude: 2 V
Lead Channel Setting Pacing Pulse Width: 0.4 ms
Lead Channel Setting Sensing Sensitivity: 0.6 mV
Pulse Gen Serial Number: 100436

## 2022-11-16 LAB — CUP PACEART REMOTE DEVICE CHECK
Date Time Interrogation Session: 20240520004700
Implantable Pulse Generator Implant Date: 20231228
Pulse Gen Serial Number: 102318

## 2022-11-17 NOTE — Progress Notes (Signed)
[place] Loop Recorder 

## 2022-11-26 NOTE — Progress Notes (Signed)
Carelink Summary Report / Loop Recorder 

## 2022-11-26 NOTE — Progress Notes (Signed)
Remote ICD transmission.   

## 2022-11-30 ENCOUNTER — Ambulatory Visit: Payer: HMO

## 2022-12-14 ENCOUNTER — Ambulatory Visit (INDEPENDENT_AMBULATORY_CARE_PROVIDER_SITE_OTHER): Payer: PPO

## 2022-12-14 DIAGNOSIS — I5022 Chronic systolic (congestive) heart failure: Secondary | ICD-10-CM

## 2022-12-14 DIAGNOSIS — I255 Ischemic cardiomyopathy: Secondary | ICD-10-CM

## 2022-12-15 LAB — CUP PACEART REMOTE DEVICE CHECK
Date Time Interrogation Session: 20240617164328
Implantable Lead Connection Status: 753985
Implantable Lead Implant Date: 20140523
Implantable Lead Location: 753860
Implantable Lead Model: 296
Implantable Lead Serial Number: 305696
Implantable Pulse Generator Implant Date: 20140523
Pulse Gen Serial Number: 100436

## 2022-12-16 LAB — CUP PACEART REMOTE DEVICE CHECK
Date Time Interrogation Session: 20240613005100
Implantable Pulse Generator Implant Date: 20231228
Pulse Gen Serial Number: 102318

## 2023-01-01 ENCOUNTER — Ambulatory Visit: Payer: HMO

## 2023-01-04 NOTE — Progress Notes (Signed)
[place] Loop Recorder 

## 2023-01-13 LAB — CUP PACEART REMOTE DEVICE CHECK
Date Time Interrogation Session: 20240715085600
Implantable Pulse Generator Implant Date: 20231228
Pulse Gen Serial Number: 102318

## 2023-01-14 ENCOUNTER — Ambulatory Visit (INDEPENDENT_AMBULATORY_CARE_PROVIDER_SITE_OTHER): Payer: PPO

## 2023-01-14 DIAGNOSIS — I255 Ischemic cardiomyopathy: Secondary | ICD-10-CM | POA: Diagnosis not present

## 2023-01-28 NOTE — Progress Notes (Signed)
Carelink Summary Report / Loop Recorder 

## 2023-02-01 ENCOUNTER — Ambulatory Visit: Payer: HMO

## 2023-02-11 ENCOUNTER — Ambulatory Visit (INDEPENDENT_AMBULATORY_CARE_PROVIDER_SITE_OTHER): Payer: PPO

## 2023-02-11 DIAGNOSIS — I255 Ischemic cardiomyopathy: Secondary | ICD-10-CM

## 2023-02-11 LAB — CUP PACEART REMOTE DEVICE CHECK
Battery Remaining Longevity: 48 mo
Battery Remaining Percentage: 52 %
Brady Statistic RV Percent Paced: 0 %
Date Time Interrogation Session: 20240815031500
HighPow Impedance: 65 Ohm
Implantable Lead Connection Status: 753985
Implantable Lead Implant Date: 20140523
Implantable Lead Location: 753860
Implantable Lead Model: 296
Implantable Lead Serial Number: 305696
Implantable Pulse Generator Implant Date: 20140523
Lead Channel Impedance Value: 529 Ohm
Lead Channel Pacing Threshold Amplitude: 0.5 V
Lead Channel Pacing Threshold Pulse Width: 0.4 ms
Lead Channel Setting Pacing Amplitude: 2 V
Lead Channel Setting Pacing Pulse Width: 0.4 ms
Lead Channel Setting Sensing Sensitivity: 0.6 mV
Pulse Gen Serial Number: 100436

## 2023-02-15 ENCOUNTER — Ambulatory Visit (INDEPENDENT_AMBULATORY_CARE_PROVIDER_SITE_OTHER): Payer: PPO

## 2023-02-15 DIAGNOSIS — I63512 Cerebral infarction due to unspecified occlusion or stenosis of left middle cerebral artery: Secondary | ICD-10-CM

## 2023-02-16 LAB — CUP PACEART REMOTE DEVICE CHECK
Date Time Interrogation Session: 20240819005200
Implantable Pulse Generator Implant Date: 20231228
Pulse Gen Serial Number: 102318

## 2023-02-23 NOTE — Progress Notes (Signed)
Remote ICD transmission.   

## 2023-02-25 NOTE — Progress Notes (Signed)
[place] Loop Recorder 

## 2023-03-04 ENCOUNTER — Ambulatory Visit: Payer: HMO

## 2023-03-18 ENCOUNTER — Ambulatory Visit: Payer: PPO

## 2023-03-18 DIAGNOSIS — I255 Ischemic cardiomyopathy: Secondary | ICD-10-CM | POA: Diagnosis not present

## 2023-03-31 LAB — CUP PACEART REMOTE DEVICE CHECK
Date Time Interrogation Session: 20241001172600
Implantable Pulse Generator Implant Date: 20231228
Pulse Gen Serial Number: 102318

## 2023-04-01 NOTE — Progress Notes (Signed)
Carelink Summary Report / Loop Recorder 

## 2023-04-05 ENCOUNTER — Ambulatory Visit: Payer: HMO

## 2023-04-19 ENCOUNTER — Ambulatory Visit: Payer: PPO

## 2023-05-06 ENCOUNTER — Ambulatory Visit: Payer: HMO

## 2023-05-10 NOTE — Progress Notes (Signed)
Cardiology Office Note:    Date:  05/18/2023   ID:  Richard Flynn, DOB 05-Feb-1956, MRN [id number]  PCP:  Marijean Niemann   [facility] Providers Cardiologist:  Olga Millers, MD Electrophysiologist:  Will Jorja Loa, MD     Referring MD: Jacquelin Hawking, PA-C   Chief Complaint  Patient presents with   Follow-up    CHF    History of Present Illness:    Richard Flynn is a 67 y.o. male with a hx of CAD, chronic systolic heart failure, PAD, HTN, HLD, CVA, DM2, CKD 3a, and tobacco abuse.   Heart cath 05/02/12 with RCA CTO and left to right and right to right collaterals, moderate distal LCX disease. Heart cath 05/2020 demonstrated known RCA CTO with collaterals as above and 50% OM1, euvolemic by right heart cath.   She has a history of chronic systolic heart failure felt secondary to ischemic cardiomyopathy.  Echocardiogram in 2013 showed an LVEF 22% and dilated left and right ventricles with MR.  Given CAD and EF, she underwent ICD implantation 10/2012 with a [employer] ICD.  Echocardiogram in 2017 improved to 30 to 35%.    He has a history of CVA 06/20/2022 with large vessel occlusion of the left M1 segment treated with IR guided thrombectomy of the left MCA.  ILR 06/05/2022 implanted with noted "false A-fib events."  By my review, no detected atrial fibrillation.  Echocardiogram 05/2022 with stable LVEF 25-30%, grade 1 DD, moderate LAE, trivial MR.  He remains on DAPT with aspirin, Plavix, 80 mg Lipitor, 24-26 mg Entresto twice daily, and 10 mg Jardiance.  He was last seen in clinic 10/14/22 and was doing well at that time.  He presents today for evaluation of SOB. He has had SOB x 1 month with orthopnea. No chest pain.   He brings labs from PCP that show BNP of 688 drawn on 04/28/23.  sCr is also rising at 1.55 (1.45) but doesn't appear far from baseline.  He is referred to nephrology and cardiology for elevated BNP, CKD, and SOB.    Past Medical  History:  Diagnosis Date   CAD (coronary artery disease)    a. [facility] notes suggest cardiac cath 2013 showed occluded proximal RCA with left to right collaterals, 20-40% proximal LAD, 70% distal circumflex artery.   Cardiomyopathy (HCC)    Chronic systolic CHF (congestive heart failure) (HCC)    CKD (chronic kidney disease), stage III (HCC)    Diabetes mellitus without complication (HCC)    Hyperlipidemia    Hypertension    NSVT (nonsustained ventricular tachycardia) (HCC)    mentioned in device interrogation   PAD (peripheral artery disease) (HCC)     with aortoiliac occlusion (med rx per [facility])   Pancreatitis     Past Surgical History:  Procedure Laterality Date   CHOLECYSTECTOMY     ICD IMPLANT     IR ANGIO INTRA EXTRACRAN SEL COM CAROTID INNOMINATE UNI R MOD SED  06/20/2022   IR CT HEAD LTD  06/20/2022   IR PERCUTANEOUS ART THROMBECTOMY/INFUSION INTRACRANIAL INC DIAG ANGIO  06/20/2022   IR US GUIDE VASC ACCESS RIGHT  06/20/2022   LEFT HEART CATH AND CORONARY ANGIOGRAPHY N/A 06/17/2020   Procedure: LEFT HEART CATH AND CORONARY ANGIOGRAPHY;  Surgeon: Corky Crafts, MD;  Location: [facility] INVASIVE CV LAB;  Service: Cardiovascular;  Laterality: N/A;   LOOP RECORDER INSERTION N/A 06/25/2022   Procedure: LOOP RECORDER INSERTION;  Surgeon: Lalla Brothers,  Rossie Muskrat, MD;  Location: [facility] INVASIVE CV LAB;  Service: Cardiovascular;  Laterality: N/A;   RADIOLOGY WITH ANESTHESIA N/A 06/20/2022   Procedure: IR WITH ANESTHESIA;  Surgeon: Radiologist, Medication, MD;  Location: [facility] OR;  Service: Radiology;  Laterality: N/A;   RIGHT HEART CATH N/A 06/17/2020   Procedure: RIGHT HEART CATH;  Surgeon: Corky Crafts, MD;  Location: [facility] INVASIVE CV LAB;  Service: Cardiovascular;  Laterality: N/A;    Current Medications: Current Meds  Medication Sig   allopurinol (ZYLOPRIM) 100 MG tablet Take by mouth.   aspirin EC 81 MG tablet Take 1 tablet (81 mg total) by mouth daily.   atorvastatin  (LIPITOR) 80 MG tablet TAKE ONE TABLET BY MOUTH DAILY   clopidogrel (PLAVIX) 75 MG tablet Take 1 tablet (75 mg total) by mouth daily.   Coenzyme Q10 ([employer] COENZYME Q-10) 100 MG capsule Take 100 mg by mouth daily.   empagliflozin (JARDIANCE) 10 MG TABS tablet TAKE 1 TABLET BY MOUTH DAILY   ENTRESTO 24-26 MG TAKE ONE TABLET BY MOUTH TWICE A DAY   furosemide (LASIX) 20 MG tablet Take 0.5 tablets (10 mg total) by mouth daily. (Patient taking differently: Take 40 mg by mouth daily.)   insulin glargine-yfgn (SEMGLEE) 100 UNIT/ML injection Inject 0.1 mLs (10 Units total) into the skin at bedtime. (Patient taking differently: Inject 15 Units into the skin at bedtime.)   Multiple Vitamins-Minerals (HAIR SKIN NAILS PO) Take 1 tablet by mouth 2 (two) times daily.   Omega-3 Fatty Acids (FISH OIL) 1000 MG CAPS Take 1 capsule by mouth daily.   traZODone (DESYREL) 50 MG tablet Take by mouth.     Allergies:   Patient has no known allergies.   Social History   Socioeconomic History   Marital status: Married    Spouse name: Not on file   Number of children: 0   Years of education: Not on file   Highest education level: Not on file  Occupational History   Not on file  Tobacco Use   Smoking status: Former    Current packs/day: 0.00    Average packs/day: 1 pack/day for 50.0 years (50.0 ttl pk-yrs)    Types: Cigarettes    Start date: 55    Quit date: 2012    Years since quitting: 12.8   Smokeless tobacco: Never   Tobacco comments:    vape  Vaping Use   Vaping status: Former  Substance and Sexual Activity   Alcohol use: Not Currently    Comment: 1 beer per month   Drug use: Not Currently   Sexual activity: Yes  Other Topics Concern   Not on file  Social History Narrative   Not on file   Social Determinants of Health   Financial Resource Strain: Low Risk  (07/06/2022)   Received from [facility], [facility]   Overall Financial Resource Strain (CARDIA)    Difficulty of Paying Living  Expenses: Not hard at all  Food Insecurity: No Food Insecurity (07/06/2022)   Received from [facility], [facility]   Hunger Vital Sign    Worried About Running Out of Food in the Last Year: Never true    Ran Out of Food in the Last Year: Never true  Transportation Needs: No Transportation Needs (07/06/2022)   Received from [employer], [facility]   PRAPARE - Transportation    Lack of Transportation (Medical): No    Lack of Transportation (Non-Medical): No  Physical Activity: Unknown (07/06/2022)   Received from  [facility]   Exercise Vital Sign    Days of Exercise per Week: 0 days    Minutes of Exercise per Session: Not on file  Recent Concern: Physical Activity - Inactive (07/06/2022)   Received from [facility]   Exercise Vital Sign    Days of Exercise per Week: 0 days    Minutes of Exercise per Session: 40 min  Stress: Stress Concern Present (07/06/2022)   Received from [facility], [facility] of Occupational Health - Occupational Stress Questionnaire    Feeling of Stress : To some extent  Social Connections: Socially Integrated (07/06/2022)   Received from [facility], [facility]   Social Network    How would you rate your social network (family, work, friends)?: Good participation with social networks     Family History: The patient's family history includes CAD in his mother; Stroke in his father.  ROS:   Please see the history of present illness.     All other systems reviewed and are negative.  EKGs/Labs/Other Studies Reviewed:    The following studies were reviewed today:  Cardiac Studies & Procedures   CARDIAC CATHETERIZATION  CARDIAC CATHETERIZATION 06/17/2020  Narrative  Prox RCA lesion is 100% stenosed. Left to right collaterals.  1st Mrg lesion is 50% stenosed. Calcified lesion. Other mild, nonobstructive disease on the left side.  LV end diastolic pressure is normal.  There is no aortic valve stenosis.  Ao sat  98%, PA sat 58%, PA pressure 27/7, mean PA 14 mm Hg; mean PCWP 11 mm Hg; CO 4.11 L/min; CI 2.2  No significant change from prior cath report.  Medical therapy for CAD and heart failure.  Appears euvolemic by right heart cath.  Findings Coronary Findings Diagnostic  Dominance: Right  Left Anterior Descending There is mild diffuse disease throughout the vessel.  Left Circumflex The vessel exhibits minimal luminal irregularities.  First Obtuse Marginal Branch 1st Mrg lesion is 50% stenosed.  Right Coronary Artery Prox RCA lesion is 100% stenosed.  Right Posterior Descending Artery Collaterals RPDA filled by collaterals from 2nd Sept.  Intervention  No interventions have been documented.     ECHOCARDIOGRAM  ECHOCARDIOGRAM COMPLETE 06/20/2022  Narrative ECHOCARDIOGRAM REPORT    Patient Name:   Richard Flynn Date of Exam: 06/20/2022 Medical Rec #:  [id number]     Height:       65.0 in Accession #:    [id number]    Weight:       175.9 lb Date of Birth:  [birth year]     BSA:          1.873 m Patient Age:    66 years      BP:           116/64 mmHg Patient Gender: M             HR:           62 bpm. Exam Location:  Inpatient  Procedure: 2D Echo, Cardiac Doppler, Color Doppler and Intracardiac Opacification Agent  Indications:    Stroke  History:        Patient has prior history of Echocardiogram examinations, most recent 06/15/2020. CHF, CAD, Defibrillator, PAD; Risk Factors:Diabetes, Dyslipidemia and Former Smoker. CKD.  Sonographer:    Ross Ludwig RDCS (AE) Referring Phys: [id number] [facility]  IMPRESSIONS   1. Left ventricular ejection fraction, by estimation, is 25 to 30%. The left ventricle has severely decreased function. The left ventricle demonstrates global hypokinesis.  The left ventricular internal cavity size was moderately dilated. There is mild left ventricular hypertrophy. Left ventricular diastolic parameters are consistent with Grade I diastolic  dysfunction (impaired relaxation). 2. Right ventricular systolic function is mildly reduced. The right ventricular size is normal. Tricuspid regurgitation signal is inadequate for assessing PA pressure. 3. Left atrial size was mild to moderately dilated. 4. The mitral valve is normal in structure. Trivial mitral valve regurgitation. No evidence of mitral stenosis. 5. The aortic valve is tricuspid. There is mild calcification of the aortic valve. Aortic valve regurgitation is not visualized. No aortic stenosis is present. 6. The inferior vena cava is normal in size with greater than 50% respiratory variability, suggesting right atrial pressure of 3 mmHg.  Conclusion(s)/Recommendation(s): No intracardiac source of embolism detected on this transthoracic study. Consider a transesophageal echocardiogram to exclude cardiac source of embolism if clinically indicated. No left ventricular mural or apical thrombus/thrombi.  FINDINGS Left Ventricle: Left ventricular ejection fraction, by estimation, is 25 to 30%. The left ventricle has severely decreased function. The left ventricle demonstrates global hypokinesis. Definity contrast agent was given IV to delineate the left ventricular endocardial borders. 3D left ventricular ejection fraction analysis performed but not reported based on interpreter judgement due to suboptimal tracking. The left ventricular internal cavity size was moderately dilated. There is mild left ventricular hypertrophy. Left ventricular diastolic parameters are consistent with Grade I diastolic dysfunction (impaired relaxation).  Right Ventricle: The right ventricular size is normal. No increase in right ventricular wall thickness. Right ventricular systolic function is mildly reduced. Tricuspid regurgitation signal is inadequate for assessing PA pressure.  Left Atrium: Left atrial size was mild to moderately dilated.  Right Atrium: Right atrial size was normal in  size.  Pericardium: There is no evidence of pericardial effusion.  Mitral Valve: The mitral valve is normal in structure. Trivial mitral valve regurgitation. No evidence of mitral valve stenosis.  Tricuspid Valve: The tricuspid valve is normal in structure. Tricuspid valve regurgitation is trivial. No evidence of tricuspid stenosis.  Aortic Valve: The aortic valve is tricuspid. There is mild calcification of the aortic valve. Aortic valve regurgitation is not visualized. No aortic stenosis is present. Aortic valve mean gradient measures 3.0 mmHg. Aortic valve peak gradient measures 5.5 mmHg. Aortic valve area, by VTI measures 3.35 cm.  Pulmonic Valve: The pulmonic valve was normal in structure. Pulmonic valve regurgitation is not visualized. No evidence of pulmonic stenosis.  Aorta: The aortic root is normal in size and structure. Ascending aorta measurements are within normal limits for age when indexed to body surface area.  Venous: The inferior vena cava is normal in size with greater than 50% respiratory variability, suggesting right atrial pressure of 3 mmHg.  IAS/Shunts: The atrial septum is grossly normal.  Additional Comments: A device lead is visualized in the right ventricle.   LEFT VENTRICLE PLAX 2D LVIDd:         6.50 cm      Diastology LVIDs:         5.70 cm      LV e' medial:    4.35 cm/s LV PW:         1.30 cm      LV E/e' medial:  19.7 LV IVS:        1.40 cm      LV e' lateral:   4.90 cm/s LVOT diam:     2.20 cm      LV E/e' lateral: 17.5 LV SV:  89 LV SV Index:   48 LVOT Area:     3.80 cm  3D Volume EF: LV Volumes (MOD)            3D EF:        38 % LV vol d, MOD A4C: 262.0 ml LV EDV:       210 ml LV vol s, MOD A4C: 159.0 ml LV ESV:       131 ml LV SV MOD A4C:     262.0 ml LV SV:        79 ml  RIGHT VENTRICLE RV Basal diam:  3.00 cm RV S prime:     14.70 cm/s TAPSE (M-mode): 3.0 cm  LEFT ATRIUM             Index        RIGHT ATRIUM            Index LA diam:        4.90 cm 2.62 cm/m   RA Area:     17.70 cm LA Vol (A2C):   90.2 ml 48.15 ml/m  RA Volume:   48.90 ml  26.11 ml/m LA Vol (A4C):   59.4 ml 31.71 ml/m LA Biplane Vol: 74.5 ml 39.77 ml/m AORTIC VALVE AV Area (Vmax):    3.51 cm AV Area (Vmean):   3.42 cm AV Area (VTI):     3.35 cm AV Vmax:           117.00 cm/s AV Vmean:          77.000 cm/s AV VTI:            0.267 m AV Peak Grad:      5.5 mmHg AV Mean Grad:      3.0 mmHg LVOT Vmax:         108.00 cm/s LVOT Vmean:        69.200 cm/s LVOT VTI:          0.235 m LVOT/AV VTI ratio: 0.88  AORTA Ao Root diam: 3.20 cm Ao Asc diam:  3.80 cm  MITRAL VALVE MV Area (PHT): 2.95 cm     SHUNTS MV Decel Time: 257 msec     Systemic VTI:  0.24 m MV E velocity: 85.90 cm/s   Systemic Diam: 2.20 cm MV A velocity: 127.00 cm/s MV E/A ratio:  0.68  Weston Brass MD Electronically signed by Weston Brass MD Signature Date/Time: 06/20/2022/3:52:41 PM    Final                  Recent Labs: 06/26/2022: ALT 23 06/29/2022: Hemoglobin 9.4; Platelets 226 08/13/2022: BUN 17; Creatinine, Ser 1.33; Potassium 4.3; Sodium 138  Recent Lipid Panel    Component Value Date/Time   CHOL 111 06/21/2022 0452   TRIG 134 06/22/2022 0342   HDL 14 (L) 06/21/2022 0452   CHOLHDL 7.9 06/21/2022 0452   VLDL UNABLE TO CALCULATE IF TRIGLYCERIDE OVER 400 mg/dL 16/03/9603 5409   LDLCALC UNABLE TO CALCULATE IF TRIGLYCERIDE OVER 400 mg/dL 81/19/1478 2956   LDLDIRECT 48 06/22/2022 0342     Risk Assessment/Calculations:                Physical Exam:    VS:  BP 112/70 (BP Location: Left Arm, Patient Position: Sitting, Cuff Size: Normal)   Pulse 74   Ht 5\' 6"  (1.676 m)   Wt 168 lb (76.2 kg)   SpO2 95%   BMI 27.12 kg/m     Wt Readings  from Last 3 Encounters:  05/18/23 168 lb (76.2 kg)  10/14/22 164 lb (74.4 kg)  07/31/22 164 lb (74.4 kg)     GEN:  Well nourished, well developed in no acute distress HEENT: Normal NECK:  mild JVD LYMPHATICS: No lymphadenopathy CARDIAC: RRR, no murmurs, rubs, gallops RESPIRATORY:  Clear to auscultation without rales, wheezing or rhonchi  ABDOMEN: tense/tight MUSCULOSKELETAL:  No edema; No deformity  SKIN: Warm and dry NEUROLOGIC:  Alert and oriented x 3 PSYCHIATRIC:  Normal affect   ASSESSMENT:    1. Medication management   2. Dyspnea, unspecified type   3. DOE (dyspnea on exertion)   4. Orthopnea   5. Acute on chronic systolic heart failure (HCC)   6. Coronary artery disease involving native coronary artery of native heart without angina pectoris   7. Hyperlipidemia with target LDL less than 70   8. Primary hypertension   9. Acute CVA (cerebrovascular accident) (HCC)   10. History of CVA (cerebrovascular accident)   57. Stage 3a chronic kidney disease (HCC)    PLAN:    In order of problems listed above:  SOB, DOE, orthopnea Acute on Chronic systolic heart failure Ischemic cardiomyopathy LVEF 25-30% GDMT: 10 mg Jardiance, 24-26 mg Entresto twice daily Previously no room to titrate medications, has been taking 10 mg lasix -Symptoms are consistent with hypervolemia; however, exam does not appear grossly volume up, somewhat out of proportion to symptoms - He has a known history systolic heart failure, no recent illnesses or dietary changes - Initial blood pressure today was 98/66, was 112 systolic on recheck - I would like to keep 24-26 mg Entresto if possible, but will need to significantly increase his diuretic regimen to 40 mg lasix daily - Given blood pressure, I will try to do this without midodrine but he will require close cardiology follow-up - I have advised him to take Entresto in the morning as usual, then eat breakfast with increased hydration and then take 40 mg Lasix - I have also asked him to keep a weight log - Will collect a BMP and BNP today for baseline measure since his prior labs were a month ago -I did mention that this may require  hospitalization, which he would like to avoid -Follow-up in 3 weeks   CAD RCA CTO dating back to 2013 Diffuse disease in the LAD, first diagonal Stable disease by heart catheterization 05/2020 Remains on DAPT for CAD and CVA   Hyperlipidemia with LDL goal less than 70 Continue 80 mg Lipitor 06/21/2022: Cholesterol 111; HDL 14; LDL Cholesterol UNABLE TO CALCULATE IF TRIGLYCERIDE OVER 400 mg/dL; VLDL UNABLE TO CALCULATE IF TRIGLYCERIDE OVER 400 mg/dL 16/03/9603: Triglycerides 134 Needs repeat lipid panel   Hypertension Managed in the context of CHF   CVA with ILR in place 05/2022 DAPT with aspirin and Plavix, 80 mg Lipitor History of thrombectomy with revascularization December 2023 -ILR with "false A-fib" that was actually sinus rhythm with frequent ectopy    Medication Adjustments/Labs and Tests Ordered: Current medicines are reviewed at length with the patient today.  Concerns regarding medicines are outlined above.  Orders Placed This Encounter  Procedures   Basic metabolic panel   Brain natriuretic peptide   No orders of the defined types were placed in this encounter.   Patient Instructions  Medication Instructions:  Lasix 40 mg after breakfast until 06/07/23 apt .   *If you need a refill on your cardiac medications before your next appointment, please call your pharmacy*  Lab Work: BMET & BNP today   Testing/Procedures: NONE ordered at this time of appointment     Follow-Up: At [facility], you and your health needs are our priority.  As part of our continuing mission to provide you with exceptional heart care, we have created designated Provider Care Teams.  These Care Teams include your primary Cardiologist (physician) and Advanced Practice Providers (APPs -  Physician Assistants and Nurse Practitioners) who all work together to provide you with the care you need, when you need it.  We recommend signing up for the patient portal called  "MyChart".  Sign up information is provided on this After Visit Summary.  MyChart is used to connect with patients for Virtual Visits (Telemedicine).  Patients are able to view lab/test results, encounter notes, upcoming appointments, etc.  Non-urgent messages can be sent to your provider as well.   To learn more about what you can do with MyChart, go to [email or web address].    Your next appointment:    Keep scheduled apt 06/07/23  Provider:   Micah Flesher, PA-C        Other Instructions Please get protein drinks, Gatorade/electrolyte drinks.      Signed, Marcelino Duster, PA  05/18/2023 5:11 PM    [facility]

## 2023-05-13 ENCOUNTER — Ambulatory Visit (INDEPENDENT_AMBULATORY_CARE_PROVIDER_SITE_OTHER): Payer: PPO

## 2023-05-13 DIAGNOSIS — I255 Ischemic cardiomyopathy: Secondary | ICD-10-CM | POA: Diagnosis not present

## 2023-05-13 LAB — CUP PACEART REMOTE DEVICE CHECK
Battery Remaining Longevity: 42 mo
Battery Remaining Percentage: 48 %
Brady Statistic RV Percent Paced: 0 %
Date Time Interrogation Session: 20241114081400
HighPow Impedance: 56 Ohm
Implantable Lead Connection Status: 753985
Implantable Lead Implant Date: 20140523
Implantable Lead Location: 753860
Implantable Lead Model: 296
Implantable Lead Serial Number: 305696
Implantable Pulse Generator Implant Date: 20140523
Lead Channel Impedance Value: 485 Ohm
Lead Channel Pacing Threshold Amplitude: 0.5 V
Lead Channel Pacing Threshold Pulse Width: 0.4 ms
Lead Channel Setting Pacing Amplitude: 2 V
Lead Channel Setting Pacing Pulse Width: 0.4 ms
Lead Channel Setting Sensing Sensitivity: 0.6 mV
Pulse Gen Serial Number: 100436

## 2023-05-18 ENCOUNTER — Encounter: Payer: Self-pay | Admitting: Physician Assistant

## 2023-05-18 ENCOUNTER — Ambulatory Visit: Payer: PPO | Attending: Physician Assistant | Admitting: Physician Assistant

## 2023-05-18 VITALS — BP 112/70 | HR 74 | Ht 66.0 in | Wt 168.0 lb

## 2023-05-18 DIAGNOSIS — Z8673 Personal history of transient ischemic attack (TIA), and cerebral infarction without residual deficits: Secondary | ICD-10-CM

## 2023-05-18 DIAGNOSIS — E785 Hyperlipidemia, unspecified: Secondary | ICD-10-CM

## 2023-05-18 DIAGNOSIS — R0600 Dyspnea, unspecified: Secondary | ICD-10-CM

## 2023-05-18 DIAGNOSIS — N1831 Chronic kidney disease, stage 3a: Secondary | ICD-10-CM

## 2023-05-18 DIAGNOSIS — R0609 Other forms of dyspnea: Secondary | ICD-10-CM

## 2023-05-18 DIAGNOSIS — R0601 Orthopnea: Secondary | ICD-10-CM

## 2023-05-18 DIAGNOSIS — Z79899 Other long term (current) drug therapy: Secondary | ICD-10-CM | POA: Diagnosis not present

## 2023-05-18 DIAGNOSIS — I2510 Atherosclerotic heart disease of native coronary artery without angina pectoris: Secondary | ICD-10-CM

## 2023-05-18 DIAGNOSIS — I639 Cerebral infarction, unspecified: Secondary | ICD-10-CM

## 2023-05-18 DIAGNOSIS — I5023 Acute on chronic systolic (congestive) heart failure: Secondary | ICD-10-CM

## 2023-05-18 DIAGNOSIS — I10 Essential (primary) hypertension: Secondary | ICD-10-CM

## 2023-05-18 NOTE — Patient Instructions (Addendum)
Medication Instructions:  Lasix 40 mg after breakfast until 06/07/23 apt .   *If you need a refill on your cardiac medications before your next appointment, please call your pharmacy*   Lab Work: BMET & BNP today   Testing/Procedures: NONE ordered at this time of appointment     Follow-Up: At [facility], you and your health needs are our priority.  As part of our continuing mission to provide you with exceptional heart care, we have created designated Provider Care Teams.  These Care Teams include your primary Cardiologist (physician) and Advanced Practice Providers (APPs -  Physician Assistants and Nurse Practitioners) who all work together to provide you with the care you need, when you need it.  We recommend signing up for the patient portal called "MyChart".  Sign up information is provided on this After Visit Summary.  MyChart is used to connect with patients for Virtual Visits (Telemedicine).  Patients are able to view lab/test results, encounter notes, upcoming appointments, etc.  Non-urgent messages can be sent to your provider as well.   To learn more about what you can do with MyChart, go to [email or web address].    Your next appointment:    Keep scheduled apt 06/07/23  Provider:   Micah Flesher, PA-C        Other Instructions Please get protein drinks, Gatorade/electrolyte drinks.

## 2023-05-19 ENCOUNTER — Telehealth: Payer: Self-pay

## 2023-05-19 ENCOUNTER — Other Ambulatory Visit: Payer: Self-pay | Admitting: Physician Assistant

## 2023-05-19 DIAGNOSIS — N1831 Chronic kidney disease, stage 3a: Secondary | ICD-10-CM

## 2023-05-19 LAB — BASIC METABOLIC PANEL
BUN/Creatinine Ratio: 14 (ref 10–24)
BUN: 25 mg/dL (ref 8–27)
CO2: 23 mmol/L (ref 20–29)
Calcium: 9.1 mg/dL (ref 8.6–10.2)
Chloride: 103 mmol/L (ref 96–106)
Creatinine, Ser: 1.84 mg/dL — ABNORMAL HIGH (ref 0.76–1.27)
Glucose: 228 mg/dL — ABNORMAL HIGH (ref 70–99)
Potassium: 4.4 mmol/L (ref 3.5–5.2)
Sodium: 142 mmol/L (ref 134–144)
eGFR: 39 mL/min/{1.73_m2} — ABNORMAL LOW (ref >59)

## 2023-05-19 LAB — BRAIN NATRIURETIC PEPTIDE: BNP: 585.2 pg/mL — ABNORMAL HIGH (ref 0.0–100.0)

## 2023-05-19 NOTE — Progress Notes (Signed)
Based on labs yesterday, I have sent an urgent referral to [facility] as he will likely need diuresis in the setting of borderline BP.

## 2023-05-19 NOTE — Telephone Encounter (Signed)
Spoke with pt and pts spouse. Pt was notified of lab results. Pt started the Lasix 40 mg today and hasn't noticed increased urination yet. Pt is going to pay attention to that over the next couple of days. Pt is aware of the urgent referral placed with the kidney specialist. Pt's PCP scheduled him an apt with a kidney specialist in January 2025 but pt is happy that our office is going to get him a sooner appointment. Pt was advised to call our office back if he hasn't received a call about an appointment with nephrology by next. Pt voiced understanding.

## 2023-05-20 ENCOUNTER — Ambulatory Visit: Payer: PPO

## 2023-05-25 ENCOUNTER — Telehealth: Payer: Self-pay | Admitting: Physician Assistant

## 2023-05-25 NOTE — Telephone Encounter (Signed)
Pt is calling checking on referral to [facility] to see if we got him a earlier appt.Please advise

## 2023-05-25 NOTE — Telephone Encounter (Signed)
Patient identification verified by 2 forms. Marilynn Rail, RN    Called and spoke to patient  Patient states he has no received call for scheduling Nephrology appointment  Informed patient Referral still being processed, he will be outreached to schedule once referral complete  Patient verbalized understanding, no questions at this time

## 2023-05-26 NOTE — Progress Notes (Signed)
Remote ICD transmission.   

## 2023-05-26 NOTE — Progress Notes (Signed)
Cardiology Office Note:    Date:  06/07/2023   ID:  Richard Flynn, DOB 03-21-1956, MRN [id number]  PCP:  Marijean Niemann   [facility] Providers Cardiologist:  Olga Millers, MD Cardiology APP:  Marcelino Duster, [place]  Electrophysiologist:  Will Jorja Loa, MD     Referring MD: Jacquelin Hawking, PA-C   Chief Complaint  Patient presents with   Follow-up    History of Present Illness:    Richard Flynn is a 67 y.o. male with a hx of CAD, chronic systolic heart failure, PAD, HTN, HLD, CVA, DM2, CKD 3a, and tobacco abuse.   Heart cath 05/02/12 with RCA CTO and left to right and right to right collaterals, moderate distal LCX disease. Heart cath 05/2020 demonstrated known RCA CTO with collaterals as above and 50% OM1, euvolemic by right heart cath.   She has a history of chronic systolic heart failure felt secondary to ischemic cardiomyopathy.  Echocardiogram in 2013 showed an LVEF 22% and dilated left and right ventricles with MR.  Given CAD and EF, she underwent ICD implantation 10/2012 with a [employer] ICD.  Echocardiogram in 2017 improved to 30 to 35%.    He has a history of CVA 06/20/2022 with large vessel occlusion of the left M1 segment treated with IR guided thrombectomy of the left MCA.  ILR 06/05/2022 implanted with noted "false A-fib events."  By my review, no detected atrial fibrillation.  Echocardiogram 05/2022 with stable LVEF 25-30%, grade 1 DD, moderate LAE, trivial MR.  He remains on DAPT with aspirin, Plavix, 80 mg Lipitor, 24-26 mg Entresto twice daily, and 10 mg Jardiance.  I saw him in clinic 05/18/23 with labs from PCP that showed a BNP of 688 and sCr 1.55 (1.45).  He had been taking 10 mg of Lasix.  Symptoms of hypervolemia seemed out of proportionate his exam which was not consistent with gross volume overload.  He was initially hypotensive at 98/66, recheck was 112 systolic.  I opted to increase his Lasix regimen to 40 mg daily and  urgently referred him to nephrology for help managing his volume.  Repeat creatinine was 1.84, BNP 585.  He presents back today with weight and blood pressure log with BP in the 110s. He reports feeling so much better on 40 mg lasix. His weight log is only changed by 1-2 lbs. His nephrology appt is end of January. I'm hoping they can guide diuretic/GDMT.    Past Medical History:  Diagnosis Date   CAD (coronary artery disease)    a. [facility] notes suggest cardiac cath 2013 showed occluded proximal RCA with left to right collaterals, 20-40% proximal LAD, 70% distal circumflex artery.   Cardiomyopathy (HCC)    Chronic systolic CHF (congestive heart failure) (HCC)    CKD (chronic kidney disease), stage III (HCC)    Diabetes mellitus without complication (HCC)    Hyperlipidemia    Hypertension    NSVT (nonsustained ventricular tachycardia) (HCC)    mentioned in device interrogation   PAD (peripheral artery disease) (HCC)     with aortoiliac occlusion (med rx per [facility] Vasc Surg)   Pancreatitis     Past Surgical History:  Procedure Laterality Date   CHOLECYSTECTOMY     ICD IMPLANT     IR ANGIO INTRA EXTRACRAN SEL COM CAROTID INNOMINATE UNI R MOD SED  06/20/2022   IR CT HEAD LTD  06/20/2022   IR PERCUTANEOUS ART THROMBECTOMY/INFUSION INTRACRANIAL INC DIAG ANGIO  06/20/2022  IR US GUIDE VASC ACCESS RIGHT  06/20/2022   LEFT HEART CATH AND CORONARY ANGIOGRAPHY N/A 06/17/2020   Procedure: LEFT HEART CATH AND CORONARY ANGIOGRAPHY;  Surgeon: Corky Crafts, MD;  Location: [facility] INVASIVE CV LAB;  Service: Cardiovascular;  Laterality: N/A;   LOOP RECORDER INSERTION N/A 06/25/2022   Procedure: LOOP RECORDER INSERTION;  Surgeon: Lanier Prude, MD;  Location: [facility] INVASIVE CV LAB;  Service: Cardiovascular;  Laterality: N/A;   RADIOLOGY WITH ANESTHESIA N/A 06/20/2022   Procedure: IR WITH ANESTHESIA;  Surgeon: Radiologist, Medication, MD;  Location: [facility] OR;  Service: Radiology;  Laterality:  N/A;   RIGHT HEART CATH N/A 06/17/2020   Procedure: RIGHT HEART CATH;  Surgeon: Corky Crafts, MD;  Location: [facility] INVASIVE CV LAB;  Service: Cardiovascular;  Laterality: N/A;    Current Medications: Current Meds  Medication Sig   Accu-Chek Softclix Lancets lancets Use as directed.   acetaminophen (TYLENOL) 325 MG tablet Take 1-2 tablets (325-650 mg total) by mouth every 4 (four) hours as needed for mild pain.   allopurinol (ZYLOPRIM) 100 MG tablet Take by mouth.   Antarctic Krill Oil 500 MG CAPS Take by mouth daily.   aspirin EC 81 MG tablet Take 1 tablet (81 mg total) by mouth daily.   atorvastatin (LIPITOR) 80 MG tablet TAKE ONE TABLET BY MOUTH DAILY   clopidogrel (PLAVIX) 75 MG tablet Take 1 tablet (75 mg total) by mouth daily.   Coenzyme Q10 ([employer] COENZYME Q-10) 100 MG capsule Take 100 mg by mouth daily.   empagliflozin (JARDIANCE) 10 MG TABS tablet TAKE 1 TABLET BY MOUTH DAILY   ENTRESTO 24-26 MG TAKE ONE TABLET BY MOUTH TWICE A DAY   furosemide (LASIX) 20 MG tablet Take 2 tablets (40 mg total) by mouth daily.   insulin glargine-yfgn (SEMGLEE) 100 UNIT/ML injection Inject 0.1 mLs (10 Units total) into the skin at bedtime. (Patient taking differently: Inject 15 Units into the skin at bedtime.)   Multiple Vitamins-Minerals (HAIR SKIN NAILS PO) Take 1 tablet by mouth 2 (two) times daily.   nitroGLYCERIN (NITROSTAT) 0.4 MG SL tablet Place 1 tablet (0.4 mg total) under the tongue every 5 (five) minutes as needed for chest pain.   Omega-3 Fatty Acids (FISH OIL) 1000 MG CAPS Take 1 capsule by mouth daily.   traZODone (DESYREL) 50 MG tablet Take by mouth.     Allergies:   Patient has no known allergies.   Social History   Socioeconomic History   Marital status: Married    Spouse name: Not on file   Number of children: 0   Years of education: Not on file   Highest education level: Not on file  Occupational History   Not on file  Tobacco Use   Smoking status: Former    Current  packs/day: 0.00    Average packs/day: 1 pack/day for 50.0 years (50.0 ttl pk-yrs)    Types: Cigarettes    Start date: 48    Quit date: 2012    Years since quitting: 12.9   Smokeless tobacco: Never   Tobacco comments:    vape  Vaping Use   Vaping status: Former  Substance and Sexual Activity   Alcohol use: Not Currently    Comment: 1 beer per month   Drug use: Not Currently   Sexual activity: Yes  Other Topics Concern   Not on file  Social History Narrative   Not on file   Social Determinants of Health   Financial Resource Strain: Low  Risk  (07/06/2022)   Received from [facility], [facility]   Overall Financial Resource Strain (CARDIA)    Difficulty of Paying Living Expenses: Not hard at all  Food Insecurity: No Food Insecurity (07/06/2022)   Received from [facility], [facility]   Hunger Vital Sign    Worried About Running Out of Food in the Last Year: Never true    Ran Out of Food in the Last Year: Never true  Transportation Needs: No Transportation Needs (07/06/2022)   Received from [facility], [facility]   [facility] - Transportation    Lack of Transportation (Medical): No    Lack of Transportation (Non-Medical): No  Physical Activity: Unknown (07/06/2022)   Received from [facility]   Exercise Vital Sign    Days of Exercise per Week: 0 days    Minutes of Exercise per Session: Not on file  Recent Concern: Physical Activity - Inactive (07/06/2022)   Received from [facility]   Exercise Vital Sign    Days of Exercise per Week: 0 days    Minutes of Exercise per Session: 40 min  Stress: Stress Concern Present (07/06/2022)   Received from [facility] - Occupational Stress Questionnaire    Feeling of Stress : To some extent  Social Connections: Socially Integrated (07/06/2022)   Received from [facility], [facility]   Social Network    How would you rate your social network (family, work,  friends)?: Good participation with social networks     Family History: The patient's family history includes CAD in his mother; Stroke in his father.  ROS:   Please see the history of present illness.     All other systems reviewed and are negative.  EKGs/Labs/Other Studies Reviewed:    The following studies were reviewed today:  Cardiac Studies & Procedures   CARDIAC CATHETERIZATION  CARDIAC CATHETERIZATION 06/17/2020  Narrative  Prox RCA lesion is 100% stenosed. Left to right collaterals.  1st Mrg lesion is 50% stenosed. Calcified lesion. Other mild, nonobstructive disease on the left side.  LV end diastolic pressure is normal.  There is no aortic valve stenosis.  Ao sat 98%, PA sat 58%, PA pressure 27/7, mean PA 14 mm Hg; mean PCWP 11 mm Hg; CO 4.11 L/min; CI 2.2  No significant change from prior cath report.  Medical therapy for CAD and heart failure.  Appears euvolemic by right heart cath.  Findings Coronary Findings Diagnostic  Dominance: Right  Left Anterior Descending There is mild diffuse disease throughout the vessel.  Left Circumflex The vessel exhibits minimal luminal irregularities.  First Obtuse Marginal Branch 1st Mrg lesion is 50% stenosed.  Right Coronary Artery Prox RCA lesion is 100% stenosed.  Right Posterior Descending Artery Collaterals RPDA filled by collaterals from 2nd Sept.  Intervention  No interventions have been documented.     ECHOCARDIOGRAM  ECHOCARDIOGRAM COMPLETE 06/20/2022  Narrative ECHOCARDIOGRAM REPORT    Patient Name:   AURIEL IDLER Date of Exam: 06/20/2022 Medical Rec #:  [id number]     Height:       65.0 in Accession #:    [id number]    Weight:       175.9 lb Date of Birth:  16-Apr-1956     BSA:          1.873 m Patient Age:    66 years      BP:           116/64  mmHg Patient Gender: M             HR:           62 bpm. Exam Location:  Inpatient  Procedure: 2D Echo, Cardiac Doppler, Color Doppler and  Intracardiac Opacification Agent  Indications:    Stroke  History:        Patient has prior history of Echocardiogram examinations, most recent 06/15/2020. CHF, CAD, Defibrillator, PAD; Risk Factors:Diabetes, Dyslipidemia and Former Smoker. CKD.  Sonographer:    Ross Ludwig RDCS (AE) Referring Phys: [id number] [facility]  IMPRESSIONS   1. Left ventricular ejection fraction, by estimation, is 25 to 30%. The left ventricle has severely decreased function. The left ventricle demonstrates global hypokinesis. The left ventricular internal cavity size was moderately dilated. There is mild left ventricular hypertrophy. Left ventricular diastolic parameters are consistent with Grade I diastolic dysfunction (impaired relaxation). 2. Right ventricular systolic function is mildly reduced. The right ventricular size is normal. Tricuspid regurgitation signal is inadequate for assessing PA pressure. 3. Left atrial size was mild to moderately dilated. 4. The mitral valve is normal in structure. Trivial mitral valve regurgitation. No evidence of mitral stenosis. 5. The aortic valve is tricuspid. There is mild calcification of the aortic valve. Aortic valve regurgitation is not visualized. No aortic stenosis is present. 6. The inferior vena cava is normal in size with greater than 50% respiratory variability, suggesting right atrial pressure of 3 mmHg.  Conclusion(s)/Recommendation(s): No intracardiac source of embolism detected on this transthoracic study. Consider a transesophageal echocardiogram to exclude cardiac source of embolism if clinically indicated. No left ventricular mural or apical thrombus/thrombi.  FINDINGS Left Ventricle: Left ventricular ejection fraction, by estimation, is 25 to 30%. The left ventricle has severely decreased function. The left ventricle demonstrates global hypokinesis. Definity contrast agent was given IV to delineate the left ventricular endocardial borders. 3D left  ventricular ejection fraction analysis performed but not reported based on interpreter judgement due to suboptimal tracking. The left ventricular internal cavity size was moderately dilated. There is mild left ventricular hypertrophy. Left ventricular diastolic parameters are consistent with Grade I diastolic dysfunction (impaired relaxation).  Right Ventricle: The right ventricular size is normal. No increase in right ventricular wall thickness. Right ventricular systolic function is mildly reduced. Tricuspid regurgitation signal is inadequate for assessing PA pressure.  Left Atrium: Left atrial size was mild to moderately dilated.  Right Atrium: Right atrial size was normal in size.  Pericardium: There is no evidence of pericardial effusion.  Mitral Valve: The mitral valve is normal in structure. Trivial mitral valve regurgitation. No evidence of mitral valve stenosis.  Tricuspid Valve: The tricuspid valve is normal in structure. Tricuspid valve regurgitation is trivial. No evidence of tricuspid stenosis.  Aortic Valve: The aortic valve is tricuspid. There is mild calcification of the aortic valve. Aortic valve regurgitation is not visualized. No aortic stenosis is present. Aortic valve mean gradient measures 3.0 mmHg. Aortic valve peak gradient measures 5.5 mmHg. Aortic valve area, by VTI measures 3.35 cm.  Pulmonic Valve: The pulmonic valve was normal in structure. Pulmonic valve regurgitation is not visualized. No evidence of pulmonic stenosis.  Aorta: The aortic root is normal in size and structure. Ascending aorta measurements are within normal limits for age when indexed to body surface area.  Venous: The inferior vena cava is normal in size with greater than 50% respiratory variability, suggesting right atrial pressure of 3 mmHg.  IAS/Shunts: The atrial septum is grossly normal.  Additional Comments: A  device lead is visualized in the right ventricle.   LEFT VENTRICLE PLAX  2D LVIDd:         6.50 cm      Diastology LVIDs:         5.70 cm      LV e' medial:    4.35 cm/s LV PW:         1.30 cm      LV E/e' medial:  19.7 LV IVS:        1.40 cm      LV e' lateral:   4.90 cm/s LVOT diam:     2.20 cm      LV E/e' lateral: 17.5 LV SV:         89 LV SV Index:   48 LVOT Area:     3.80 cm  3D Volume EF: LV Volumes (MOD)            3D EF:        38 % LV vol d, MOD A4C: 262.0 ml LV EDV:       210 ml LV vol s, MOD A4C: 159.0 ml LV ESV:       131 ml LV SV MOD A4C:     262.0 ml LV SV:        79 ml  RIGHT VENTRICLE RV Basal diam:  3.00 cm RV S prime:     14.70 cm/s TAPSE (M-mode): 3.0 cm  LEFT ATRIUM             Index        RIGHT ATRIUM           Index LA diam:        4.90 cm 2.62 cm/m   RA Area:     17.70 cm LA Vol (A2C):   90.2 ml 48.15 ml/m  RA Volume:   48.90 ml  26.11 ml/m LA Vol (A4C):   59.4 ml 31.71 ml/m LA Biplane Vol: 74.5 ml 39.77 ml/m AORTIC VALVE AV Area (Vmax):    3.51 cm AV Area (Vmean):   3.42 cm AV Area (VTI):     3.35 cm AV Vmax:           117.00 cm/s AV Vmean:          77.000 cm/s AV VTI:            0.267 m AV Peak Grad:      5.5 mmHg AV Mean Grad:      3.0 mmHg LVOT Vmax:         108.00 cm/s LVOT Vmean:        69.200 cm/s LVOT VTI:          0.235 m LVOT/AV VTI ratio: 0.88  AORTA Ao Root diam: 3.20 cm Ao Asc diam:  3.80 cm  MITRAL VALVE MV Area (PHT): 2.95 cm     SHUNTS MV Decel Time: 257 msec     Systemic VTI:  0.24 m MV E velocity: 85.90 cm/s   Systemic Diam: 2.20 cm MV A velocity: 127.00 cm/s MV E/A ratio:  0.68  Weston Brass MD Electronically signed by Weston Brass MD Signature Date/Time: 06/20/2022/3:52:41 PM    Final                   Recent Labs: 06/26/2022: ALT 23 06/29/2022: Hemoglobin 9.4; Platelets 226 05/18/2023: BNP 585.2; BUN 25; Creatinine, Ser 1.84; Potassium 4.4; Sodium 142  Recent Lipid Panel    Component Value Date/Time  CHOL 111 06/21/2022 0452   TRIG 134 06/22/2022 0342    HDL 14 (L) 06/21/2022 0452   CHOLHDL 7.9 06/21/2022 0452   VLDL UNABLE TO CALCULATE IF TRIGLYCERIDE OVER 400 mg/dL 40/98/1191 4782   LDLCALC UNABLE TO CALCULATE IF TRIGLYCERIDE OVER 400 mg/dL 95/62/1308 6578   LDLDIRECT 48 06/22/2022 0342     Risk Assessment/Calculations:                Physical Exam:    VS:  BP (!) 90/54 (BP Location: Left Arm, Patient Position: Sitting, Cuff Size: Normal)   Pulse 72   Ht 5\' 6"  (1.676 m)   Wt 165 lb (74.8 kg)   BMI 26.63 kg/m     Wt Readings from Last 3 Encounters:  06/07/23 165 lb (74.8 kg)  05/18/23 168 lb (76.2 kg)  10/14/22 164 lb (74.4 kg)     GEN:  Well nourished, well developed in no acute distress HEENT: Normal NECK: No JVD; No carotid bruits LYMPHATICS: No lymphadenopathy CARDIAC: RRR, no murmurs, rubs, gallops RESPIRATORY:  Clear to auscultation without rales, wheezing or rhonchi  ABDOMEN: Soft, non-tender, non-distended MUSCULOSKELETAL:  No edema; No deformity  SKIN: Warm and dry NEUROLOGIC:  Alert and oriented x 3 PSYCHIATRIC:  Normal affect   ASSESSMENT:    1. Cardiomyopathy, ischemic   2. PAD (peripheral artery disease) (HCC)   3. Essential hypertension   4. CAD S/P percutaneous coronary angioplasty   5. Hyperlipidemia with target LDL less than 70   6. Primary hypertension    PLAN:    In order of problems listed above:  Chronic systolic heart failure Ischemic cardiomyopathy LVEF 25-30% GDMT: 10 mg Jardiance, 24-26 mg Entresto twice daily Previously no room to titrate medications - recent DOE - started 40 mg lasix daily, limited by BP and CKD - feels much better on 40 mg lasix - collect repeat BNP and BMP today - if improved, may reduce to 20 mg lasix and add 12.5 mg spironolactone   CAD RCA CTO dating back to 2013 Diffuse disease in the LAD, first diagonal Stable disease by heart catheterization 05/2020 Remains on DAPT for CAD and CVA   Hyperlipidemia with LDL goal less than 70 Continue 80 mg  Lipitor 06/21/2022: Cholesterol 111; HDL 14; LDL Cholesterol UNABLE TO CALCULATE IF TRIGLYCERIDE OVER 400 mg/dL; VLDL UNABLE TO CALCULATE IF TRIGLYCERIDE OVER 400 mg/dL 46/96/2952: Triglycerides 134 Need repeat lipid panel (is not fasting today)   Hypertension Managed in the context of CHF   CVA with ILR in place 05/2022 DAPT with aspirin and Plavix, 80 mg Lipitor History of thrombectomy with revascularization December 2023 -ILR with "false A-fib" that was actually sinus rhythm with frequent ectopy   Follow up in 3 months.     Medication Adjustments/Labs and Tests Ordered: Current medicines are reviewed at length with the patient today.  Concerns regarding medicines are outlined above.  Orders Placed This Encounter  Procedures   Basic metabolic panel   B Nat Peptide   EKG 12-Lead   No orders of the defined types were placed in this encounter.   Patient Instructions  Medication Instructions:  Continue same medications *If you need a refill on your cardiac medications before your next appointment, please call your pharmacy*   Lab Work: Bmet,bnp today   Testing/Procedures: None ordered   Follow-Up: At [facility], you and your health needs are our priority.  As part of our continuing mission to provide you with exceptional heart care, we have  created designated Provider Care Teams.  These Care Teams include your primary Cardiologist (physician) and Advanced Practice Providers (APPs -  Physician Assistants and Nurse Practitioners) who all work together to provide you with the care you need, when you need it.  We recommend signing up for the patient portal called "MyChart".  Sign up information is provided on this After Visit Summary.  MyChart is used to connect with patients for Virtual Visits (Telemedicine).  Patients are able to view lab/test results, encounter notes, upcoming appointments, etc.  Non-urgent messages can be sent to your provider as well.   To  learn more about what you can do with MyChart, go to [email or web address].     Your next appointment:  3 months  Friday 09/03/23 at 1:30 pm    Provider:  Micah Flesher PA     Signed, Roe Rutherford [place], [place]  06/07/2023 2:54 PM    [facility]

## 2023-05-31 ENCOUNTER — Telehealth: Payer: Self-pay | Admitting: Physician Assistant

## 2023-05-31 MED ORDER — FUROSEMIDE 20 MG PO TABS: 40.0000 mg | ORAL_TABLET | Freq: Every day | ORAL | 3 refills | Status: DC

## 2023-05-31 NOTE — Telephone Encounter (Signed)
*  STAT* If patient is at the pharmacy, call can be transferred to refill team.   1. Which medications need to be refilled? (please list name of each medication and dose if known)   furosemide (LASIX) 20 MG tablet   Pt he is now taking 40mg  daily. - Patient taking differently: Take 40 mg by mouth daily.      4. Which pharmacy/location (including street and city if local pharmacy) is medication to be sent to? [id number] - [place], [place] - [address] Phone: [phone]  Fax: [phone]       5. Do they need a 30 day or 90 day supply? 90

## 2023-06-07 ENCOUNTER — Encounter: Payer: Self-pay | Admitting: Physician Assistant

## 2023-06-07 ENCOUNTER — Ambulatory Visit: Payer: PPO | Attending: Physician Assistant | Admitting: Physician Assistant

## 2023-06-07 VITALS — BP 90/54 | HR 72 | Ht 66.0 in | Wt 165.0 lb

## 2023-06-07 DIAGNOSIS — I255 Ischemic cardiomyopathy: Secondary | ICD-10-CM

## 2023-06-07 DIAGNOSIS — I10 Essential (primary) hypertension: Secondary | ICD-10-CM | POA: Diagnosis not present

## 2023-06-07 DIAGNOSIS — I739 Peripheral vascular disease, unspecified: Secondary | ICD-10-CM

## 2023-06-07 DIAGNOSIS — I2510 Atherosclerotic heart disease of native coronary artery without angina pectoris: Secondary | ICD-10-CM | POA: Diagnosis not present

## 2023-06-07 DIAGNOSIS — Z9861 Coronary angioplasty status: Secondary | ICD-10-CM

## 2023-06-07 DIAGNOSIS — E785 Hyperlipidemia, unspecified: Secondary | ICD-10-CM

## 2023-06-07 NOTE — Patient Instructions (Addendum)
Medication Instructions:  Continue same medications *If you need a refill on your cardiac medications before your next appointment, please call your pharmacy*   Lab Work: Bmet,bnp today   Testing/Procedures: None ordered   Follow-Up: At [facility], you and your health needs are our priority.  As part of our continuing mission to provide you with exceptional heart care, we have created designated Provider Care Teams.  These Care Teams include your primary Cardiologist (physician) and Advanced Practice Providers (APPs -  Physician Assistants and Nurse Practitioners) who all work together to provide you with the care you need, when you need it.  We recommend signing up for the patient portal called "MyChart".  Sign up information is provided on this After Visit Summary.  MyChart is used to connect with patients for Virtual Visits (Telemedicine).  Patients are able to view lab/test results, encounter notes, upcoming appointments, etc.  Non-urgent messages can be sent to your provider as well.   To learn more about what you can do with MyChart, go to [email or web address].     Your next appointment:  3 months  Friday 09/03/23 at 1:30 pm    Provider:  Micah Flesher PA

## 2023-06-08 LAB — BASIC METABOLIC PANEL
BUN/Creatinine Ratio: 22 (ref 10–24)
BUN: 40 mg/dL — ABNORMAL HIGH (ref 8–27)
CO2: 22 mmol/L (ref 20–29)
Calcium: 9.8 mg/dL (ref 8.6–10.2)
Chloride: 97 mmol/L (ref 96–106)
Creatinine, Ser: 1.85 mg/dL — ABNORMAL HIGH (ref 0.76–1.27)
Glucose: 204 mg/dL — ABNORMAL HIGH (ref 70–99)
Potassium: 5 mmol/L (ref 3.5–5.2)
Sodium: 136 mmol/L (ref 134–144)
eGFR: 39 mL/min/{1.73_m2} — ABNORMAL LOW (ref >59)

## 2023-06-08 LAB — BRAIN NATRIURETIC PEPTIDE: BNP: 215.2 pg/mL — ABNORMAL HIGH (ref 0.0–100.0)

## 2023-06-09 ENCOUNTER — Other Ambulatory Visit: Payer: Self-pay

## 2023-06-21 ENCOUNTER — Ambulatory Visit (INDEPENDENT_AMBULATORY_CARE_PROVIDER_SITE_OTHER): Payer: PPO

## 2023-06-21 DIAGNOSIS — I255 Ischemic cardiomyopathy: Secondary | ICD-10-CM

## 2023-06-23 LAB — CUP PACEART REMOTE DEVICE CHECK
Date Time Interrogation Session: 20241224113600
Implantable Pulse Generator Implant Date: 20231228
Pulse Gen Serial Number: 102318

## 2023-07-02 ENCOUNTER — Telehealth: Payer: Self-pay | Admitting: Cardiology

## 2023-07-02 MED ORDER — ENTRESTO 24-26 MG PO TABS: 1.0000 | ORAL_TABLET | Freq: Two times a day (BID) | ORAL | 3 refills | Status: DC

## 2023-07-02 MED ORDER — EMPAGLIFLOZIN 10 MG PO TABS: 10.0000 mg | ORAL_TABLET | Freq: Every day | ORAL | 3 refills | Status: AC

## 2023-07-02 NOTE — Telephone Encounter (Signed)
 Pt's medications were sent to pt's pharmacy as requested. Confirmation received.

## 2023-07-02 NOTE — Telephone Encounter (Signed)
*  STAT* If patient is at the pharmacy, call can be transferred to refill team.   1. Which medications need to be refilled? (please list name of each medication and dose if known) empagliflozin  (JARDIANCE ) 10 MG TABS tablet  ENTRESTO  24-26 MG   2. Would you like to learn more about the convenience, safety, & potential cost savings by using the [facility]? No   3. Are you open to using the [facility] (Type [facility].) No   4. Which pharmacy/location (including street and city if local pharmacy) is medication to be sent to? [id number] - [place], [place] - [address]    5. Do they need a 30 day or 90 day supply? 90 day

## 2023-07-22 ENCOUNTER — Ambulatory Visit: Payer: PPO

## 2023-07-22 DIAGNOSIS — I255 Ischemic cardiomyopathy: Secondary | ICD-10-CM | POA: Diagnosis not present

## 2023-07-22 LAB — CUP PACEART REMOTE DEVICE CHECK
Date Time Interrogation Session: 20250123004000
Implantable Pulse Generator Implant Date: 20231228
Pulse Gen Serial Number: 102318

## 2023-07-30 NOTE — Progress Notes (Signed)
 Bsx Loop Recorder

## 2023-07-30 NOTE — Addendum Note (Signed)
Addended by: Geralyn Flash D on: 07/30/2023 09:51 AM   Modules accepted: Orders

## 2023-08-12 ENCOUNTER — Ambulatory Visit (INDEPENDENT_AMBULATORY_CARE_PROVIDER_SITE_OTHER): Payer: HMO

## 2023-08-12 DIAGNOSIS — I255 Ischemic cardiomyopathy: Secondary | ICD-10-CM

## 2023-08-13 LAB — CUP PACEART REMOTE DEVICE CHECK
Battery Remaining Longevity: 42 mo
Battery Remaining Percentage: 44 %
Brady Statistic RV Percent Paced: 0 %
Date Time Interrogation Session: 20250213123500
HighPow Impedance: 70 Ohm
Implantable Lead Connection Status: 753985
Implantable Lead Implant Date: 20140523
Implantable Lead Location: 753860
Implantable Lead Model: 296
Implantable Lead Serial Number: 305696
Implantable Pulse Generator Implant Date: 20140523
Lead Channel Impedance Value: 541 Ohm
Lead Channel Pacing Threshold Amplitude: 0.5 V
Lead Channel Pacing Threshold Pulse Width: 0.4 ms
Lead Channel Setting Pacing Amplitude: 2 V
Lead Channel Setting Pacing Pulse Width: 0.4 ms
Lead Channel Setting Sensing Sensitivity: 0.6 mV
Pulse Gen Serial Number: 100436

## 2023-08-23 ENCOUNTER — Ambulatory Visit (INDEPENDENT_AMBULATORY_CARE_PROVIDER_SITE_OTHER): Payer: PPO

## 2023-08-23 DIAGNOSIS — I63512 Cerebral infarction due to unspecified occlusion or stenosis of left middle cerebral artery: Secondary | ICD-10-CM

## 2023-08-26 LAB — CUP PACEART REMOTE DEVICE CHECK
Date Time Interrogation Session: 20250224004400
Implantable Pulse Generator Implant Date: 20231228
Pulse Gen Serial Number: 102318

## 2023-09-02 NOTE — Progress Notes (Signed)
 Carelink Summary Report / Loop Recorder

## 2023-09-03 ENCOUNTER — Ambulatory Visit: Payer: PPO | Admitting: Physician Assistant

## 2023-09-03 NOTE — Progress Notes (Signed)
 Cardiology Office Note:    Date:  09/07/2023   ID:  Richard Flynn, DOB 1955-10-27, MRN [id number]  PCP:  Marijean Niemann   [facility] Providers Cardiologist:  Olga Millers, MD Cardiology APP:  Marcelino Duster, [place]  Electrophysiologist:  Will Jorja Loa, MD     Referring MD: Jacquelin Hawking, PA-C   Chief Complaint  Patient presents with   Follow-up    CAD, CHF    History of Present Illness:    Richard Flynn is a 68 y.o. male with a hx of Richard Flynn is a 68 y.o. male with a hx of CAD, chronic systolic heart failure, PAD, HTN, HLD, CVA, DM2, CKD 3a, and tobacco abuse.   Heart cath 05/02/12 with RCA CTO and left to right and right to right collaterals, moderate distal LCX disease. Heart cath 05/2020 demonstrated known RCA CTO with collaterals as above and 50% OM1, euvolemic by right heart cath.   She has a history of chronic systolic heart failure felt secondary to ischemic cardiomyopathy.  Echocardiogram in 2013 showed an LVEF 22% and dilated left and right ventricles with MR.  Given CAD and EF, she underwent ICD implantation 10/2012 with a [employer] ICD.  Echocardiogram in 2017 improved to 30 to 35%.    He has a history of CVA 06/20/2022 with large vessel occlusion of the left M1 segment treated with IR guided thrombectomy of the left MCA.  ILR 06/05/2022 implanted with noted "false A-fib events."  By my review, no detected atrial fibrillation.  Echocardiogram 05/2022 with stable LVEF 25-30%, grade 1 DD, moderate LAE, trivial MR.  She remains on DAPT with aspirin, Plavix, 80 mg Lipitor, 24-26 mg Entresto twice daily, and 10 mg Jardiance.  I saw him in clinic 06/07/2023 with symptoms of hypervolemia out of proportion to his exam.  BNP was 688 and he was hypotensive at 98/66, recheck was 112 systolic.  I opted to increase his Lasix regimen to 40 mg daily and referred him to nephrology.  Repeat creatinine was 1.84 and BNP was 585.  At follow-up, he  reportedly felt much better on 40 mg Lasix even though his weight log had only changed by 1 to 2 pounds.  Initial plan was to repeat labs and hopefully reduce Lasix to 20 mg and add 12.5 mg spironolactone.  I do not see that these labs were repeated.  He presents back for follow-up.  He feels much better on 40 mg lasix. He never had repeat labs and has not seen nephrology.   He is pending colonoscopy and needs to hold plavix for 5 days.   Past Medical History:  Diagnosis Date   CAD (coronary artery disease)    a. [facility] notes suggest cardiac cath 2013 showed occluded proximal RCA with left to right collaterals, 20-40% proximal LAD, 70% distal circumflex artery.   Cardiomyopathy (HCC)    Chronic systolic CHF (congestive heart failure) (HCC)    CKD (chronic kidney disease), stage III (HCC)    Diabetes mellitus without complication (HCC)    Hyperlipidemia    Hypertension    NSVT (nonsustained ventricular tachycardia) (HCC)    mentioned in device interrogation   PAD (peripheral artery disease) (HCC)     with aortoiliac occlusion (med rx per [facility] Vasc Surg)   Pancreatitis     Past Surgical History:  Procedure Laterality Date   CHOLECYSTECTOMY     ICD IMPLANT     IR ANGIO INTRA EXTRACRAN SEL  COM CAROTID INNOMINATE UNI R MOD SED  06/20/2022   IR CT HEAD LTD  06/20/2022   IR PERCUTANEOUS ART THROMBECTOMY/INFUSION INTRACRANIAL INC DIAG ANGIO  06/20/2022   IR US GUIDE VASC ACCESS RIGHT  06/20/2022   LEFT HEART CATH AND CORONARY ANGIOGRAPHY N/A 06/17/2020   Procedure: LEFT HEART CATH AND CORONARY ANGIOGRAPHY;  Surgeon: Corky Crafts, MD;  Location: [facility] INVASIVE CV LAB;  Service: Cardiovascular;  Laterality: N/A;   LOOP RECORDER INSERTION N/A 06/25/2022   Procedure: LOOP RECORDER INSERTION;  Surgeon: Lanier Prude, MD;  Location: [facility] INVASIVE CV LAB;  Service: Cardiovascular;  Laterality: N/A;   RADIOLOGY WITH ANESTHESIA N/A 06/20/2022   Procedure: IR WITH ANESTHESIA;  Surgeon:  Radiologist, Medication, MD;  Location: [facility] OR;  Service: Radiology;  Laterality: N/A;   RIGHT HEART CATH N/A 06/17/2020   Procedure: RIGHT HEART CATH;  Surgeon: Corky Crafts, MD;  Location: [facility] INVASIVE CV LAB;  Service: Cardiovascular;  Laterality: N/A;    Current Medications: Current Meds  Medication Sig   Accu-Chek Softclix Lancets lancets Use as directed.   acetaminophen (TYLENOL) 325 MG tablet Take 1-2 tablets (325-650 mg total) by mouth every 4 (four) hours as needed for mild pain.   allopurinol (ZYLOPRIM) 100 MG tablet Take by mouth.   Antarctic Krill Oil 500 MG CAPS Take by mouth daily.   aspirin EC 81 MG tablet Take 1 tablet (81 mg total) by mouth daily.   atorvastatin (LIPITOR) 80 MG tablet TAKE ONE TABLET BY MOUTH DAILY   clopidogrel (PLAVIX) 75 MG tablet Take 1 tablet (75 mg total) by mouth daily.   Coenzyme Q10 ([employer]S COENZYME Q-10) 100 MG capsule Take 100 mg by mouth daily.   empagliflozin (JARDIANCE) 10 MG TABS tablet Take 1 tablet (10 mg total) by mouth daily.   furosemide (LASIX) 20 MG tablet Take 2 tablets (40 mg total) by mouth daily.   insulin glargine-yfgn (SEMGLEE) 100 UNIT/ML injection Inject 0.1 mLs (10 Units total) into the skin at bedtime. (Patient taking differently: Inject 15 Units into the skin at bedtime.)   Multiple Vitamins-Minerals (HAIR SKIN NAILS PO) Take 1 tablet by mouth 2 (two) times daily.   nitroGLYCERIN (NITROSTAT) 0.4 MG SL tablet Place 1 tablet (0.4 mg total) under the tongue every 5 (five) minutes as needed for chest pain.   Omega-3 Fatty Acids (FISH OIL) 1000 MG CAPS Take 1 capsule by mouth daily.   sacubitril-valsartan (ENTRESTO) 24-26 MG Take 1 tablet by mouth 2 (two) times daily.   traZODone (DESYREL) 50 MG tablet Take by mouth.     Allergies:   Patient has no known allergies.   Social History   Socioeconomic History   Marital status: Married    Spouse name: Not on file   Number of children: 0   Years of education: Not on file    Highest education level: Not on file  Occupational History   Not on file  Tobacco Use   Smoking status: Former    Current packs/day: 0.00    Average packs/day: 1 pack/day for 50.0 years (50.0 ttl pk-yrs)    Types: Cigarettes    Start date: 96    Quit date: 2012    Years since quitting: 13.2   Smokeless tobacco: Never   Tobacco comments:    vape  Vaping Use   Vaping status: Former  Substance and Sexual Activity   Alcohol use: Not Currently    Comment: 1 beer per month   Drug use: Not  Currently   Sexual activity: Yes  Other Topics Concern   Not on file  Social History Narrative   Not on file   Social Drivers of Health   Financial Resource Strain: Low Risk  (09/06/2023)   Received from [facility]   Overall Financial Resource Strain (CARDIA)    Difficulty of Paying Living Expenses: Not hard at all  Food Insecurity: No Food Insecurity (09/06/2023)   Received from [facility]   Hunger Vital Sign    Worried About Running Out of Food in the Last Year: Never true    Ran Out of Food in the Last Year: Never true  Transportation Needs: No Transportation Needs (09/06/2023)   Received from [facility] - Transportation    Lack of Transportation (Medical): No    Lack of Transportation (Non-Medical): No  Physical Activity: Insufficiently Active (09/06/2023)   Received from [facility]   Exercise Vital Sign    Days of Exercise per Week: 4 days    Minutes of Exercise per Session: 20 min  Stress: No Stress Concern Present (09/06/2023)   Received from [facility] - Occupational Stress Questionnaire    Feeling of Stress : Not at all  Social Connections: Socially Integrated (09/06/2023)   Received from [facility]   Social Network    How would you rate your social network (family, work, friends)?: Good participation with social networks     Family History: The patient's family history includes CAD in his mother; Stroke  in his father.  ROS:   Please see the history of present illness.     All other systems reviewed and are negative.  EKGs/Labs/Other Studies Reviewed:    The following studies were reviewed today:       Recent Labs: 06/07/2023: BNP 215.2; BUN 40; Creatinine, Ser 1.85; Potassium 5.0; Sodium 136  Recent Lipid Panel    Component Value Date/Time   CHOL 111 06/21/2022 0452   TRIG 134 06/22/2022 0342   HDL 14 (L) 06/21/2022 0452   CHOLHDL 7.9 06/21/2022 0452   VLDL UNABLE TO CALCULATE IF TRIGLYCERIDE OVER 400 mg/dL 53/66/4403 4742   LDLCALC UNABLE TO CALCULATE IF TRIGLYCERIDE OVER 400 mg/dL 59/56/3875 6433   LDLDIRECT 48 06/22/2022 0342     Risk Assessment/Calculations:                Physical Exam:    VS:  BP 112/70 (BP Location: Right Arm, Patient Position: Sitting, Cuff Size: Normal)   Pulse 69   Ht 5\' 6"  (1.676 m)   Wt 170 lb 6.4 oz (77.3 kg)   SpO2 97%   BMI 27.50 kg/m     Wt Readings from Last 3 Encounters:  09/07/23 170 lb 6.4 oz (77.3 kg)  06/07/23 165 lb (74.8 kg)  05/18/23 168 lb (76.2 kg)     GEN:  Well nourished, well developed in no acute distress HEENT: Normal NECK: No JVD; No carotid bruits LYMPHATICS: No lymphadenopathy CARDIAC: RRR, no murmurs, rubs, gallops RESPIRATORY:  Clear to auscultation without rales, wheezing or rhonchi  ABDOMEN: Soft, non-tender, non-distended MUSCULOSKELETAL:  No edema; No deformity  SKIN: Warm and dry NEUROLOGIC:  Alert and oriented x 3 PSYCHIATRIC:  Normal affect   ASSESSMENT:    1. Medication management   2. Stage 3a chronic kidney disease (HCC)   3. Coronary artery disease involving native coronary artery of native heart without angina pectoris   4. Preoperative cardiovascular examination  5. Hyperlipidemia with target LDL less than 70   6. History of CVA (cerebrovascular accident)   7. Essential hypertension    PLAN:    In order of problems listed above:  CAD RCA CTO dating back to 2013 Diffuse  disease in the LAD, first diagonal Stable disease by heart catheterization 05/2020 Remains on DAPT for CAD and CVA - no chest pain   Hyperlipidemia with LDL goal less than 70 Continue 80 mg Lipitor Update fasting lipids at next visit   Chronic systolic heart failure Ischemic cardiomyopathy LVEF 25-30% GDMT: 10 mg Jardiance, 24-26 mg Entresto twice daily, 40 mg lasix Medication titration has been limited by blood pressure and CKD.  He has been doing well on 40 mg Lasix. No repeat labs, have not started spironolactone.  - repeat BMP today - if renal function not improved, will refer to nephrology and AHF clinic for help titrating medications   Hypertension Managed in the context of CHF   CVA with ILR in place 05/2022 DAPT with aspirin and Plavix, 80 mg Lipitor History of thrombectomy with revascularization December 2023 -ILR with "false A-fib" that was actually sinus rhythm with frequent ectopy   Preoperative risk evaluation prior to colonoscopy with a 5 day plavix hold. Given CAD, CHF, CVA, and insulin needs, he has at least 11% risk of Mace for any procedure.  He is able to complete greater than 4.0 METS without angina.  He is not volume up and denies exertional chest pain.  I do not think he has any unstable cardiac issues.  No further cardiovascular testing prior to colonoscopy, a low risk procedure.  He may hold Plavix 5 days before his procedure and restart as soon after as possible.  He should continue aspirin throughout the perioperative period.   Follow-up with me or Dr. Jens Som in 3 months.    Medication Adjustments/Labs and Tests Ordered: Current medicines are reviewed at length with the patient today.  Concerns regarding medicines are outlined above.  Orders Placed This Encounter  Procedures   Basic Metabolic Panel (BMET)   Ambulatory referral to Nephrology   No orders of the defined types were placed in this encounter.   Patient Instructions  Medication  Instructions:  NO CHANGES *If you need a refill on your cardiac medications before your next appointment, please call your pharmacy*   Lab Work: BMET today If you have labs (blood work) drawn today and your tests are completely normal, you will receive your results only by: MyChart Message (if you have MyChart) OR A paper copy in the mail If you have any lab test that is abnormal or we need to change your treatment, we will call you to review the results.   Follow-Up: At [facility], you and your health needs are our priority.  As part of our continuing mission to provide you with exceptional heart care, we have created designated Provider Care Teams.  These Care Teams include your primary Cardiologist (physician) and Advanced Practice Providers (APPs -  Physician Assistants and Nurse Practitioners) who all work together to provide you with the care you need, when you need it.   Your next appointment:   12/09/2023 at 3:35 with Micah Flesher PA Other Instructions      Signed, Marcelino Duster, [place]  09/07/2023 4:41 PM    [facility]

## 2023-09-07 ENCOUNTER — Encounter: Payer: Self-pay | Admitting: Physician Assistant

## 2023-09-07 ENCOUNTER — Ambulatory Visit: Payer: PPO | Attending: Physician Assistant | Admitting: Physician Assistant

## 2023-09-07 VITALS — BP 112/70 | HR 69 | Ht 66.0 in | Wt 170.4 lb

## 2023-09-07 DIAGNOSIS — I2510 Atherosclerotic heart disease of native coronary artery without angina pectoris: Secondary | ICD-10-CM | POA: Diagnosis not present

## 2023-09-07 DIAGNOSIS — N1831 Chronic kidney disease, stage 3a: Secondary | ICD-10-CM

## 2023-09-07 DIAGNOSIS — Z79899 Other long term (current) drug therapy: Secondary | ICD-10-CM

## 2023-09-07 DIAGNOSIS — Z01810 Encounter for preprocedural cardiovascular examination: Secondary | ICD-10-CM | POA: Diagnosis not present

## 2023-09-07 DIAGNOSIS — Z8673 Personal history of transient ischemic attack (TIA), and cerebral infarction without residual deficits: Secondary | ICD-10-CM

## 2023-09-07 DIAGNOSIS — E785 Hyperlipidemia, unspecified: Secondary | ICD-10-CM | POA: Diagnosis not present

## 2023-09-07 DIAGNOSIS — I10 Essential (primary) hypertension: Secondary | ICD-10-CM

## 2023-09-07 NOTE — Patient Instructions (Addendum)
 Medication Instructions:  NO CHANGES *If you need a refill on your cardiac medications before your next appointment, please call your pharmacy*   Lab Work: BMET today If you have labs (blood work) drawn today and your tests are completely normal, you will receive your results only by: MyChart Message (if you have MyChart) OR A paper copy in the mail If you have any lab test that is abnormal or we need to change your treatment, we will call you to review the results.   Follow-Up: At [facility], you and your health needs are our priority.  As part of our continuing mission to provide you with exceptional heart care, we have created designated Provider Care Teams.  These Care Teams include your primary Cardiologist (physician) and Advanced Practice Providers (APPs -  Physician Assistants and Nurse Practitioners) who all work together to provide you with the care you need, when you need it.   Your next appointment:   12/09/2023 at 3:35 with Micah Flesher PA Other Instructions

## 2023-09-08 LAB — BASIC METABOLIC PANEL
BUN/Creatinine Ratio: 18 (ref 10–24)
BUN: 27 mg/dL (ref 8–27)
CO2: 24 mmol/L (ref 20–29)
Calcium: 9.4 mg/dL (ref 8.6–10.2)
Chloride: 99 mmol/L (ref 96–106)
Creatinine, Ser: 1.52 mg/dL — ABNORMAL HIGH (ref 0.76–1.27)
Glucose: 103 mg/dL — ABNORMAL HIGH (ref 70–99)
Potassium: 4.2 mmol/L (ref 3.5–5.2)
Sodium: 141 mmol/L (ref 134–144)
eGFR: 49 mL/min/{1.73_m2} — ABNORMAL LOW (ref >59)

## 2023-09-13 ENCOUNTER — Telehealth: Payer: Self-pay

## 2023-09-13 NOTE — Telephone Encounter (Signed)
Spoke with pt. Pt was notified of lab results. Pt will continue current medication and f/u as planned.  

## 2023-09-15 NOTE — Progress Notes (Signed)
 Remote ICD transmission.

## 2023-09-15 NOTE — Addendum Note (Signed)
 Addended by: Elease Etienne A on: 09/15/2023 12:11 PM   Modules accepted: Orders

## 2023-09-19 ENCOUNTER — Other Ambulatory Visit: Payer: Self-pay | Admitting: Cardiology

## 2023-09-19 DIAGNOSIS — E7800 Pure hypercholesterolemia, unspecified: Secondary | ICD-10-CM

## 2023-09-23 ENCOUNTER — Ambulatory Visit (INDEPENDENT_AMBULATORY_CARE_PROVIDER_SITE_OTHER): Payer: PPO

## 2023-09-23 DIAGNOSIS — I63512 Cerebral infarction due to unspecified occlusion or stenosis of left middle cerebral artery: Secondary | ICD-10-CM | POA: Diagnosis not present

## 2023-09-27 LAB — CUP PACEART REMOTE DEVICE CHECK
Date Time Interrogation Session: 20250328204700
Implantable Pulse Generator Implant Date: 20231228
Pulse Gen Serial Number: 102318

## 2023-09-29 NOTE — Addendum Note (Signed)
 Addended by: Geralyn Flash D on: 09/29/2023 11:29 AM   Modules accepted: Orders

## 2023-09-29 NOTE — Progress Notes (Signed)
 Bsx Loop Recorder

## 2023-10-06 ENCOUNTER — Telehealth: Payer: Self-pay

## 2023-10-06 NOTE — Telephone Encounter (Signed)
 Received request by [facility] for patient to have a colonoscopy on 11/04/23.   He has not been seen in follow up since 2021.  I am assuming he will need to re-establish and that will need to be with a physician. (He was a patient of Dr. Elberta Fortis).    Forwarding to EP scheduling team to get him set up for check/clearance prior to 11/04/23, if possible.  Thanks.

## 2023-10-08 NOTE — Progress Notes (Signed)
 I agree with the above plan

## 2023-10-20 ENCOUNTER — Telehealth: Payer: Self-pay

## 2023-10-25 ENCOUNTER — Ambulatory Visit (INDEPENDENT_AMBULATORY_CARE_PROVIDER_SITE_OTHER)

## 2023-10-25 DIAGNOSIS — I63512 Cerebral infarction due to unspecified occlusion or stenosis of left middle cerebral artery: Secondary | ICD-10-CM | POA: Diagnosis not present

## 2023-10-25 NOTE — Progress Notes (Deleted)
   Electrophysiology Office Note:   Date:  10/25/2023  ID:  Richard Flynn, DOB [birth year], MRN [id number]  Primary Cardiologist: Alexandria Angel, MD Primary Heart Failure: None Electrophysiologist: Murielle Stang Cortland Ding, MD  {Click to update primary MD,subspecialty MD or APP then REFRESH:1}    History of Present Illness:   Richard Flynn is a 68 y.o. male with h/o coronary artery disease, hyperlipidemia, chronic systolic heart failure due to ischemic cardiomyopathy, hypertension, CVA seen today for routine electrophysiology followup.   He was found to have coronary artery disease in 2013 with was discovered to have a CTO of the RCA with right to right collaterals.  He had moderate left-sided disease at that time.  He has developed heart failure with an ejection fraction of at least 30 to 35%.  He underwent ICD implantation in 2014 with a [employer] ICD.  He had CVA 06/20/2022 due to large vessel occlusion of the M1.  She had an ILR implanted 06/05/2022.  Since last being seen in our clinic the patient reports doing ***.  he denies chest pain, palpitations, dyspnea, PND, orthopnea, nausea, vomiting, dizziness, syncope, edema, weight gain, or early satiety.   Review of systems complete and found to be negative unless listed in HPI.  All Device History: Environmental manager loop recorder implanted 06/25/22 for Cryptogenic Stroke  EP Information / Studies Reviewed:    {EKGtoday:28818}       Risk Assessment/Calculations:           Physical Exam:   VS:  There were no vitals taken for this visit.   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  Coronary artery disease: RCA CTO dating back to 2013.  Plan per primary cardiology.  3.  Chronic systolic heart failure: Due to ischemic cardiomyopathy.  On goal-directed medical therapy per primary cardiology.  Status post [employer] ICD.***  4.  Hypertension:***  {Click here to Review PMH, Prob List, Meds, Allergies, SHx, FHx  :1}   Follow up with {WUJWJ:19147} {EPFOLLOW WG:95621}  Signed, Aisling Emigh Cortland Ding, MD

## 2023-10-26 ENCOUNTER — Ambulatory Visit: Admitting: Cardiology

## 2023-10-26 ENCOUNTER — Telehealth: Payer: Self-pay | Admitting: Cardiology

## 2023-10-26 NOTE — Telephone Encounter (Signed)
 Called patient's wife, DPR, back about message. Informed her that office visit on 09/07/23 Angie Duke had this in her note. Patient's wife give fax number if doctor doing procedure needs to send in clearance request. Patient's wife verbalized understanding.  "Preoperative risk evaluation prior to colonoscopy with a 5 day plavix  hold. Given CAD, CHF, CVA, and insulin  needs, he has at least 11% risk of Mace for any  procedure.  He is able to complete greater than 4.0 METS without angina.  He is not volume up and denies exertional chest pain.  I do not think he has any unstable cardiac issues.  No further cardiovascular testing prior to colonoscopy, a low risk procedure.  He may hold Plavix  5 days before his procedure and restart as soon after as possible.  He should continue aspirin  throughout the perioperative period."

## 2023-10-26 NOTE — Telephone Encounter (Signed)
 Pt c/o medication issue:  1. Name of Medication:   clopidogrel  (PLAVIX ) 75 MG tablet   2. How are you currently taking this medication (dosage and times per day)?   3. Are you having a reaction (difficulty breathing--STAT)?   4. What is your medication issue?    Wife Wallene Gum) wants a call back to discuss if patient can stop Plavix  for 5 days prior to having a colonoscopy.

## 2023-10-26 NOTE — Telephone Encounter (Signed)
  Pt called today and rescheduled [place] appt. [place]. His new appt is on 05/12 at 11:00 AM. He said, he will contact his doctor to reschedule his colonoscopy

## 2023-10-29 LAB — CUP PACEART REMOTE DEVICE CHECK
Date Time Interrogation Session: 20250501185100
Implantable Pulse Generator Implant Date: 20231228
Pulse Gen Serial Number: 102318

## 2023-11-03 NOTE — Addendum Note (Signed)
 Addended by: Lott Rouleau A on: 11/03/2023 03:52 PM   Modules accepted: Orders

## 2023-11-03 NOTE — Progress Notes (Signed)
 Carelink Summary Report / Loop Recorder

## 2023-11-08 ENCOUNTER — Ambulatory Visit: Attending: Cardiology | Admitting: Cardiology

## 2023-11-08 ENCOUNTER — Encounter: Payer: Self-pay | Admitting: Cardiology

## 2023-11-08 VITALS — BP 110/68 | HR 64 | Ht 66.0 in | Wt 166.0 lb

## 2023-11-08 DIAGNOSIS — I5023 Acute on chronic systolic (congestive) heart failure: Secondary | ICD-10-CM

## 2023-11-08 DIAGNOSIS — I10 Essential (primary) hypertension: Secondary | ICD-10-CM | POA: Diagnosis not present

## 2023-11-08 DIAGNOSIS — I2510 Atherosclerotic heart disease of native coronary artery without angina pectoris: Secondary | ICD-10-CM | POA: Diagnosis not present

## 2023-11-08 DIAGNOSIS — I255 Ischemic cardiomyopathy: Secondary | ICD-10-CM

## 2023-11-08 LAB — CUP PACEART INCLINIC DEVICE CHECK
Date Time Interrogation Session: 20250512110840
HighPow Impedance: 42 Ohm
HighPow Impedance: 69 Ohm
Implantable Lead Connection Status: 753985
Implantable Lead Implant Date: 20140523
Implantable Lead Location: 753860
Implantable Lead Model: 296
Implantable Lead Serial Number: 305696
Implantable Pulse Generator Implant Date: 20140523
Lead Channel Impedance Value: 547 Ohm
Lead Channel Pacing Threshold Amplitude: 0.7 V
Lead Channel Pacing Threshold Pulse Width: 0.4 ms
Lead Channel Sensing Intrinsic Amplitude: 25 mV
Lead Channel Setting Pacing Amplitude: 2 V
Lead Channel Setting Pacing Pulse Width: 0.4 ms
Lead Channel Setting Sensing Sensitivity: 0.6 mV
Pulse Gen Serial Number: 100436

## 2023-11-08 NOTE — Progress Notes (Signed)
  Electrophysiology Office Note:   Date:  11/08/2023  ID:  Richard Flynn, DOB 1956-03-24, MRN [id number]  Primary Cardiologist: Richard Angel, MD Primary Heart Failure: None Electrophysiologist: Richard Tappan Cortland Ding, MD      History of Present Illness:   Richard Flynn is a 68 y.o. male with h/o coronary artery disease, diabetes, hypertension, hyperlipidemia, chronic systolic heart failure, CVA due to ischemic cardiomyopathy seen today for routine electrophysiology followup.   Left heart catheterization 2013 with CTO of the RCA and moderate circumflex disease.  There were collaterals left to right.  In 2023 he had a large vessel occlusion of the left M1 segment treated with IR thrombectomy.  ILR was implanted at the time.  Since last being seen in our clinic the patient reports doing well.  He has no chest pain or shortness of breath.  He is able to do all of his daily activities without restriction.  he denies chest pain, palpitations, dyspnea, PND, orthopnea, nausea, vomiting, dizziness, syncope, edema, weight gain, or early satiety.   Review of systems complete and found to be negative unless listed in HPI.      EP Information / Studies Reviewed:    EKG is ordered today. Personal review as below.  EKG Interpretation Date/Time:  Monday Nov 08 2023 10:52:48 EDT Ventricular Rate:  60 PR Interval:  170 QRS Duration:  110 QT Interval:  430 QTC Calculation: 430 R Axis:   31  Text Interpretation: Sinus rhythm with occasional Premature ventricular complexes Septal infarct (cited on or before 07-Jun-2023) ST & T wave abnormality, consider inferolateral ischemia When compared with ECG of 07-Jun-2023 14:00, Premature ventricular complexes are now Present Confirmed by Richard Flynn ([id number]) on 11/08/2023 11:03:27 AM   ICD Interrogation-  reviewed in detail today,  See PACEART report.  Device History: Magazine features editor ICD implanted 2014 for CHF History of appropriate therapy:  No History of AAD therapy: No   Risk Assessment/Calculations:             Physical Exam:   VS:  BP 110/68   Pulse 64   Ht 5\' 6"  (1.676 m)   Wt 166 lb (75.3 kg)   SpO2 98%   BMI 26.79 kg/m    Wt Readings from Last 3 Encounters:  11/08/23 166 lb (75.3 kg)  09/07/23 170 lb 6.4 oz (77.3 kg)  06/07/23 165 lb (74.8 kg)     GEN: Well nourished, well developed in no acute distress NECK: No JVD; No carotid bruits CARDIAC: Regular rate and rhythm, no murmurs, rubs, gallops RESPIRATORY:  Clear to auscultation without rales, wheezing or rhonchi  ABDOMEN: Soft, non-tender, non-distended EXTREMITIES:  No edema; No deformity   ASSESSMENT AND PLAN:    Chronic systolic dysfunction s/p [employer] single chamber ICD  euvolemic today Stable on an appropriate medical regimen Normal ICD function Sensing, threshold, impedance within normal limits Programming reviewed and appropriate for patient See Valeta Gaudier Art report No changes today  2.  Coronary disease: Post CTO of the RCA and left-sided disease.  Stable on catheterization in 2021.  No chest pain.  3.  CVA: Post ILR.  No atrial fibrillation noted.  4.  Hypertension: Well-controlled  5.  Preoperative evaluation: Has plans for colonoscopy.  Patient is asymptomatic from a cardiac perspective.  No further cardiac testing is necessary.  Disposition:   Follow up with EP APP in 12 months   Signed, Yicel Shannon Cortland Ding, MD

## 2023-11-08 NOTE — Patient Instructions (Signed)

## 2023-11-11 ENCOUNTER — Ambulatory Visit (INDEPENDENT_AMBULATORY_CARE_PROVIDER_SITE_OTHER): Payer: HMO

## 2023-11-11 ENCOUNTER — Ambulatory Visit: Payer: Self-pay | Admitting: Cardiology

## 2023-11-11 DIAGNOSIS — I255 Ischemic cardiomyopathy: Secondary | ICD-10-CM

## 2023-11-11 LAB — CUP PACEART REMOTE DEVICE CHECK
Battery Remaining Longevity: 36 mo
Battery Remaining Percentage: 40 %
Brady Statistic RV Percent Paced: 0 %
Date Time Interrogation Session: 20250515030100
HighPow Impedance: 72 Ohm
Implantable Lead Connection Status: 753985
Implantable Lead Implant Date: 20140523
Implantable Lead Location: 753860
Implantable Lead Model: 296
Implantable Lead Serial Number: 305696
Implantable Pulse Generator Implant Date: 20140523
Lead Channel Impedance Value: 539 Ohm
Lead Channel Pacing Threshold Amplitude: 0.7 V
Lead Channel Pacing Threshold Pulse Width: 0.4 ms
Lead Channel Setting Pacing Amplitude: 2 V
Lead Channel Setting Pacing Pulse Width: 0.4 ms
Lead Channel Setting Sensing Sensitivity: 0.6 mV
Pulse Gen Serial Number: 100436

## 2023-11-22 ENCOUNTER — Encounter

## 2023-11-25 ENCOUNTER — Ambulatory Visit

## 2023-11-29 ENCOUNTER — Ambulatory Visit: Payer: Self-pay | Admitting: Cardiology

## 2023-11-29 LAB — CUP PACEART REMOTE DEVICE CHECK
Date Time Interrogation Session: 20250602015400
Implantable Pulse Generator Implant Date: 20231228
Pulse Gen Serial Number: 102318

## 2023-12-06 NOTE — Progress Notes (Unsigned)
 Cardiology Office Note:    Date:  12/06/2023   ID:  Dock Freer, DOB December 19, 1955, MRN [id number]  PCP:  Climmie Damme   [facility] Providers Cardiologist:  Alexandria Angel, MD Cardiology APP:  Lamond Pilot, [place]  Electrophysiologist:  Will Cortland Ding, MD { Click to update primary MD,subspecialty MD or APP then REFRESH:1}    Referring MD: Mordecai Applebaum, PA-C   No chief complaint on file. ***  History of Present Illness:    Richard Flynn is a 68 y.o. male with a hx of CAD, chronic systolic heart failure, PAD, HTN, HLD, CVA, DM2, CKD 3a, and tobacco abuse.   Heart cath 05/02/12 with RCA CTO and left to right and right to right collaterals, moderate distal LCX disease. Heart cath 05/2020 demonstrated known RCA CTO with collaterals as above and 50% OM1, euvolemic by right heart cath.   She has a history of chronic systolic heart failure felt secondary to ischemic cardiomyopathy.  Echocardiogram in 2013 showed an LVEF 22% and dilated left and right ventricles with MR.  Given CAD and EF, she underwent ICD implantation 10/2012 with a [employer] ICD.  Echocardiogram in 2017 improved to 30 to 35%.    He has a history of CVA 06/20/2022 with large vessel occlusion of the left M1 segment treated with IR guided thrombectomy of the left MCA.  ILR 06/05/2022 implanted with noted "false A-fib events."  By my review, no detected atrial fibrillation.  Echocardiogram 05/2022 with stable LVEF 25-30%, grade 1 DD, moderate LAE, trivial MR.  She remains on DAPT with aspirin , Plavix , 80 mg Lipitor , 24-26 mg Entresto  twice daily, and 10 mg Jardiance .  I saw him in clinic 06/07/2023 with symptoms of hypervolemia out of proportion to his exam.  BNP was 688 and he was hypotensive at 98/66, recheck was 112 systolic.  I opted to increase his Lasix  regimen to 40 mg daily and referred him to nephrology.  Repeat creatinine was 1.84 and BNP was 585.  At follow-up, he reportedly felt  much better on 40 mg Lasix  even though his weight log had only changed by 1 to 2 pounds.  Initial plan was to repeat labs and hopefully reduce Lasix  to 20 mg and add 12.5 mg spironolactone. When I saw him 08/2023 he felt much better on 40 mg lasix . I cleared him for colonoscopy without further workup with plavix  hold. I had not started spironolactone.       CAD RCA CTO dating back to 2013 Diffuse disease in the LAD, first diagonal Stable disease by heart catheterization 05/2020 Remains on DAPT for CAD and CVA   Hyperlipidemia with LDL goal less than 70 Continue 80 mg Lipitor  06/21/2022: Cholesterol 111; HDL 14; LDL Cholesterol UNABLE TO CALCULATE IF TRIGLYCERIDE OVER 400 mg/dL; VLDL UNABLE TO CALCULATE IF TRIGLYCERIDE OVER 400 mg/dL 78/29/5621: Triglycerides 134 Repeat a lipid panel today   Chronic systolic heart failure Ischemic cardiomyopathy LVEF 25-30% GDMT: 10 mg Jardiance , 24-26 mg Entresto  twice daily Medication titration has been limited by blood pressure and CKD.  He has been doing well on 40 mg Lasix . No repeat labs, have not started spironolactone.   Given CKD and BP, consider referral to AHF clinic   Hypertension Managed in the context of CHF   CVA with ILR in place 05/2022 DAPT with aspirin  and Plavix , 80 mg Lipitor  History of thrombectomy with revascularization December 2023 -ILR with "false A-fib" that was actually sinus rhythm with frequent ectopy  Past Medical History:  Diagnosis Date   CAD (coronary artery disease)    a. [facility] notes suggest cardiac cath 2013 showed occluded proximal RCA with left to right collaterals, 20-40% proximal LAD, 70% distal circumflex artery.   Cardiomyopathy (HCC)    Chronic systolic CHF (congestive heart failure) (HCC)    CKD (chronic kidney disease), stage III (HCC)    Diabetes mellitus without complication (HCC)    Hyperlipidemia    Hypertension    NSVT (nonsustained ventricular tachycardia) (HCC)     mentioned in device interrogation   PAD (peripheral artery disease) (HCC)     with aortoiliac occlusion (med rx per [facility])   Pancreatitis     Past Surgical History:  Procedure Laterality Date   CHOLECYSTECTOMY     ICD IMPLANT     IR ANGIO INTRA EXTRACRAN SEL COM CAROTID INNOMINATE UNI R MOD SED  06/20/2022   IR CT HEAD LTD  06/20/2022   IR PERCUTANEOUS ART THROMBECTOMY/INFUSION INTRACRANIAL INC DIAG ANGIO  06/20/2022   IR US  GUIDE VASC ACCESS RIGHT  06/20/2022   LEFT HEART CATH AND CORONARY ANGIOGRAPHY N/A 06/17/2020   Procedure: LEFT HEART CATH AND CORONARY ANGIOGRAPHY;  Surgeon: Lucendia Rusk, MD;  Location: [facility] INVASIVE CV LAB;  Service: Cardiovascular;  Laterality: N/A;   LOOP RECORDER INSERTION N/A 06/25/2022   Procedure: LOOP RECORDER INSERTION;  Surgeon: Boyce Byes, MD;  Location: [facility] INVASIVE CV LAB;  Service: Cardiovascular;  Laterality: N/A;   RADIOLOGY WITH ANESTHESIA N/A 06/20/2022   Procedure: IR WITH ANESTHESIA;  Surgeon: Radiologist, Medication, MD;  Location: [facility] OR;  Service: Radiology;  Laterality: N/A;   RIGHT HEART CATH N/A 06/17/2020   Procedure: RIGHT HEART CATH;  Surgeon: Lucendia Rusk, MD;  Location: [facility] INVASIVE CV LAB;  Service: Cardiovascular;  Laterality: N/A;    Current Medications: No outpatient medications have been marked as taking for the 12/09/23 encounter (Appointment) with Lamond Pilot, PA.     Allergies:   Patient has no known allergies.   Social History   Socioeconomic History   Marital status: Married    Spouse name: Not on file   Number of children: 0   Years of education: Not on file   Highest education level: Not on file  Occupational History   Not on file  Tobacco Use   Smoking status: Former    Current packs/day: 0.00    Average packs/day: 1 pack/day for 50.0 years (50.0 ttl pk-yrs)    Types: Cigarettes    Start date: 10    Quit date: 2012    Years since quitting: 13.4   Smokeless tobacco:  Never   Tobacco comments:    vape  Vaping Use   Vaping status: Former  Substance and Sexual Activity   Alcohol use: Not Currently    Comment: 1 beer per month   Drug use: Not Currently   Sexual activity: Yes  Other Topics Concern   Not on file  Social History Narrative   Not on file   Social Drivers of Health   Financial Resource Strain: Low Risk  (09/06/2023)   Received from [facility]   Overall Financial Resource Strain (CARDIA)    Difficulty of Paying Living Expenses: Not hard at all  Food Insecurity: No Food Insecurity (09/06/2023)   Received from [facility]   Hunger Vital Sign    Worried About Running Out of Food in the Last Year: Never true    Ran Out of Food in  the Last Year: Never true  Transportation Needs: No Transportation Needs (09/06/2023)   Received from [facility] - Transportation    Lack of Transportation (Medical): No    Lack of Transportation (Non-Medical): No  Physical Activity: Insufficiently Active (09/06/2023)   Received from [facility]   Exercise Vital Sign    Days of Exercise per Week: 4 days    Minutes of Exercise per Session: 20 min  Stress: No Stress Concern Present (09/06/2023)   Received from [facility] - Occupational Stress Questionnaire    Feeling of Stress : Not at all  Social Connections: Socially Integrated (09/06/2023)   Received from [facility]   Social Network    How would you rate your social network (family, work, friends)?: Good participation with social networks     Family History: The patient's ***family history includes CAD in his mother; Stroke in his father.  ROS:   Please see the history of present illness.    *** All other systems reviewed and are negative.  EKGs/Labs/Other Studies Reviewed:    The following studies were reviewed today: ***      Recent Labs: 06/07/2023: BNP 215.2 09/07/2023: BUN 27; Creatinine, Ser 1.52; Potassium 4.2; Sodium 141   Recent Lipid Panel    Component Value Date/Time   CHOL 111 06/21/2022 0452   TRIG 134 06/22/2022 0342   HDL 14 (L) 06/21/2022 0452   CHOLHDL 7.9 06/21/2022 0452   VLDL UNABLE TO CALCULATE IF TRIGLYCERIDE OVER 400 mg/dL 04/54/0981 1914   LDLCALC UNABLE TO CALCULATE IF TRIGLYCERIDE OVER 400 mg/dL 78/29/5621 3086   LDLDIRECT 48 06/22/2022 0342     Risk Assessment/Calculations:   {Does this patient have ATRIAL FIBRILLATION?:[phone]}  No BP recorded.  {Refresh Note OR Click here to enter BP  :1}***         Physical Exam:    VS:  There were no vitals taken for this visit.    Wt Readings from Last 3 Encounters:  11/08/23 166 lb (75.3 kg)  09/07/23 170 lb 6.4 oz (77.3 kg)  06/07/23 165 lb (74.8 kg)     GEN: *** Well nourished, well developed in no acute distress HEENT: Normal NECK: No JVD; No carotid bruits LYMPHATICS: No lymphadenopathy CARDIAC: ***RRR, no murmurs, rubs, gallops RESPIRATORY:  Clear to auscultation without rales, wheezing or rhonchi  ABDOMEN: Soft, non-tender, non-distended MUSCULOSKELETAL:  No edema; No deformity  SKIN: Warm and dry NEUROLOGIC:  Alert and oriented x 3 PSYCHIATRIC:  Normal affect   ASSESSMENT:    No diagnosis found. PLAN:    In order of problems listed above:  ***      {Are you ordering a CV Procedure (e.g. stress test, cath, DCCV, TEE, etc)?   Press F2        :[id number]}    Medication Adjustments/Labs and Tests Ordered: Current medicines are reviewed at length with the patient today.  Concerns regarding medicines are outlined above.  No orders of the defined types were placed in this encounter.  No orders of the defined types were placed in this encounter.   There are no Patient Instructions on file for this visit.   Signed, Warren Haber Kammy Klett, PA  12/06/2023 2:27 PM    [facility]

## 2023-12-09 ENCOUNTER — Ambulatory Visit: Attending: Physician Assistant | Admitting: Physician Assistant

## 2023-12-09 ENCOUNTER — Encounter: Payer: Self-pay | Admitting: Physician Assistant

## 2023-12-09 VITALS — BP 116/70 | HR 79 | Ht 66.0 in | Wt 166.0 lb

## 2023-12-09 DIAGNOSIS — E785 Hyperlipidemia, unspecified: Secondary | ICD-10-CM

## 2023-12-09 DIAGNOSIS — Z8673 Personal history of transient ischemic attack (TIA), and cerebral infarction without residual deficits: Secondary | ICD-10-CM

## 2023-12-09 DIAGNOSIS — I2510 Atherosclerotic heart disease of native coronary artery without angina pectoris: Secondary | ICD-10-CM

## 2023-12-09 DIAGNOSIS — I10 Essential (primary) hypertension: Secondary | ICD-10-CM

## 2023-12-09 DIAGNOSIS — N1831 Chronic kidney disease, stage 3a: Secondary | ICD-10-CM | POA: Diagnosis not present

## 2023-12-09 DIAGNOSIS — E0865 Diabetes mellitus due to underlying condition with hyperglycemia: Secondary | ICD-10-CM

## 2023-12-09 DIAGNOSIS — I255 Ischemic cardiomyopathy: Secondary | ICD-10-CM

## 2023-12-09 DIAGNOSIS — Z794 Long term (current) use of insulin: Secondary | ICD-10-CM

## 2023-12-09 NOTE — Patient Instructions (Signed)
 Medication Instructions:  Your physician recommends that you continue on your current medications as directed. Please refer to the Current Medication list given to you today.  *If you need a refill on your cardiac medications before your next appointment, please call your pharmacy*  Lab Work: NONE ordered at this time of appointment   Testing/Procedures: NONE ordered at this time of appointment    Follow-Up: At [facility], you and your health needs are our priority.  As part of our continuing mission to provide you with exceptional heart care, our providers are all part of one team.  This team includes your primary Cardiologist (physician) and Advanced Practice Providers or APPs (Physician Assistants and Nurse Practitioners) who all work together to provide you with the care you need, when you need it.  Your next appointment:   6 month(s) morning appointment   Provider:   Alexandria Angel, MD or Marcie Sever, PA-C          We recommend signing up for the patient portal called MyChart.  Sign up information is provided on this After Visit Summary.  MyChart is used to connect with patients for Virtual Visits (Telemedicine).  Patients are able to view lab/test results, encounter notes, upcoming appointments, etc.  Non-urgent messages can be sent to your provider as well.   To learn more about what you can do with MyChart, go to [email or web address].   Other Instructions

## 2023-12-15 NOTE — Progress Notes (Signed)
 Bsx Loop Recorder

## 2023-12-15 NOTE — Addendum Note (Signed)
 Addended by: Edra Govern D on: 12/15/2023 11:48 AM   Modules accepted: Orders

## 2023-12-21 NOTE — Progress Notes (Signed)
 Remote ICD transmission.

## 2023-12-27 ENCOUNTER — Ambulatory Visit: Payer: Self-pay | Admitting: Cardiology

## 2023-12-27 ENCOUNTER — Ambulatory Visit

## 2023-12-27 DIAGNOSIS — I255 Ischemic cardiomyopathy: Secondary | ICD-10-CM

## 2023-12-27 LAB — CUP PACEART REMOTE DEVICE CHECK
Date Time Interrogation Session: 20250629015700
Implantable Pulse Generator Implant Date: 20231228
Pulse Gen Serial Number: 102318

## 2024-01-27 ENCOUNTER — Ambulatory Visit (INDEPENDENT_AMBULATORY_CARE_PROVIDER_SITE_OTHER)

## 2024-01-27 DIAGNOSIS — I255 Ischemic cardiomyopathy: Secondary | ICD-10-CM | POA: Diagnosis not present

## 2024-01-31 LAB — CUP PACEART REMOTE DEVICE CHECK
Date Time Interrogation Session: 20250804060600
Implantable Pulse Generator Implant Date: 20231228
Pulse Gen Serial Number: 102318

## 2024-02-01 ENCOUNTER — Ambulatory Visit: Payer: Self-pay | Admitting: Cardiology

## 2024-02-10 ENCOUNTER — Ambulatory Visit (INDEPENDENT_AMBULATORY_CARE_PROVIDER_SITE_OTHER): Payer: HMO

## 2024-02-10 DIAGNOSIS — I255 Ischemic cardiomyopathy: Secondary | ICD-10-CM | POA: Diagnosis not present

## 2024-02-14 LAB — CUP PACEART REMOTE DEVICE CHECK
Battery Remaining Longevity: 36 mo
Battery Remaining Percentage: 38 %
Brady Statistic RV Percent Paced: 1 %
Date Time Interrogation Session: 20250815152300
HighPow Impedance: 67 Ohm
Implantable Lead Connection Status: 753985
Implantable Lead Implant Date: 20140523
Implantable Lead Location: 753860
Implantable Lead Model: 296
Implantable Lead Serial Number: 305696
Implantable Pulse Generator Implant Date: 20140523
Lead Channel Impedance Value: 532 Ohm
Lead Channel Pacing Threshold Amplitude: 0.7 V
Lead Channel Pacing Threshold Pulse Width: 0.4 ms
Lead Channel Setting Pacing Amplitude: 2 V
Lead Channel Setting Pacing Pulse Width: 0.4 ms
Lead Channel Setting Sensing Sensitivity: 0.6 mV
Pulse Gen Serial Number: 100436

## 2024-02-17 ENCOUNTER — Ambulatory Visit: Payer: Self-pay | Admitting: Cardiology

## 2024-02-28 ENCOUNTER — Ambulatory Visit

## 2024-03-06 ENCOUNTER — Ambulatory Visit: Payer: Self-pay | Admitting: Cardiology

## 2024-03-06 LAB — CUP PACEART REMOTE DEVICE CHECK
Date Time Interrogation Session: 20250907150900
Implantable Pulse Generator Implant Date: 20231228
Pulse Gen Serial Number: 102318

## 2024-03-23 NOTE — Progress Notes (Signed)
Remote ICD Transmission.

## 2024-03-29 NOTE — Progress Notes (Signed)
 Remote Loop Recorder Transmission

## 2024-03-30 ENCOUNTER — Ambulatory Visit

## 2024-03-30 DIAGNOSIS — I255 Ischemic cardiomyopathy: Secondary | ICD-10-CM | POA: Diagnosis not present

## 2024-04-04 LAB — CUP PACEART REMOTE DEVICE CHECK
Date Time Interrogation Session: 20251006001200
Implantable Pulse Generator Implant Date: 20231228
Pulse Gen Serial Number: 102318

## 2024-04-04 NOTE — Progress Notes (Signed)
 Remote Loop Recorder Transmission

## 2024-04-09 ENCOUNTER — Ambulatory Visit: Payer: Self-pay | Admitting: Cardiology

## 2024-04-28 ENCOUNTER — Telehealth: Payer: Self-pay | Admitting: *Deleted

## 2024-04-28 MED ORDER — BISOPROLOL FUMARATE 5 MG PO TABS: 5.0000 mg | ORAL_TABLET | Freq: Every day | ORAL | 3 refills | Status: DC

## 2024-04-28 NOTE — Telephone Encounter (Signed)
 Loop alert received from [employer]:   Alert remote transmission: Tachy Event occurred 10/30 @ 19:05, duration 18sec, HR 186-201, EGM c/w WCT > 20 beats - route to triage Follow up as scheduled. LA, CVRS ___________________________________________________________________  Called patient to assess for symptoms and inquire as to why we did not also receive an alert transmission from his defibrillator about this episode  This RN requested patient send a manual transmission from his defibrillator  Transmission received and reviewed  Device functioning normally and detected multiple NSVT episodes and 1 VT episode since last scheduled transmission on 02/11/24  Patient denied experiencing any symptoms during these newly monitored events  Patient confirmed compliancy with all prescribed medications  No BB or AA noted in patient's active medication list  It turns out that the patient hasn't been sleeping next to his device when it tries to connect to him overnight for weeks and therefore we have not been receiving these alerts from his defibrillator  The patient just happened to have his mobile ILR monitoring device next to his recliner last night and that is how we noticed this issue  Patient education provided and he was highly encouraged to keep both of his monitors close to where he sleeps the majority of time (recliner) in order to increase the probability of the device clinic getting these alert transmissions  Patient and his spouse both verbalized understanding and the patient has moved his monitor next to his recliner  ED precautions reviewed with patient and spouse going into weekend and they both verbalized understanding  Will route to [facility], MD for review and advisement since the VT episode last night was >50 beats in length, and also occurred during waking hours

## 2024-04-28 NOTE — Telephone Encounter (Signed)
 Returned call to Pt.  Advised Dr. Inocencio would like Pt to start bisoprolol 5 mg one tablet by mouth daily.  Pt indicates understanding and requests prescription be sent to [facility] in [place].  Prescription sent.

## 2024-05-04 ENCOUNTER — Ambulatory Visit (INDEPENDENT_AMBULATORY_CARE_PROVIDER_SITE_OTHER)

## 2024-05-04 DIAGNOSIS — I255 Ischemic cardiomyopathy: Secondary | ICD-10-CM

## 2024-05-04 LAB — CUP PACEART REMOTE DEVICE CHECK
Date Time Interrogation Session: 20251106011600
Implantable Pulse Generator Implant Date: 20231228
Pulse Gen Serial Number: 102318

## 2024-05-08 NOTE — Progress Notes (Signed)
 Remote ICD Transmission

## 2024-05-09 ENCOUNTER — Ambulatory Visit: Payer: Self-pay | Admitting: Cardiology

## 2024-05-11 ENCOUNTER — Ambulatory Visit: Payer: HMO

## 2024-05-11 DIAGNOSIS — I255 Ischemic cardiomyopathy: Secondary | ICD-10-CM

## 2024-05-15 LAB — CUP PACEART REMOTE DEVICE CHECK
Battery Remaining Longevity: 30 mo
Battery Remaining Percentage: 35 %
Brady Statistic RV Percent Paced: 1 %
Date Time Interrogation Session: 20251115161900
HighPow Impedance: 61 Ohm
Implantable Lead Connection Status: 753985
Implantable Lead Implant Date: 20140523
Implantable Lead Location: 753860
Implantable Lead Model: 296
Implantable Lead Serial Number: 305696
Implantable Pulse Generator Implant Date: 20140523
Lead Channel Impedance Value: 499 Ohm
Lead Channel Pacing Threshold Amplitude: 0.7 V
Lead Channel Pacing Threshold Pulse Width: 0.4 ms
Lead Channel Setting Pacing Amplitude: 2 V
Lead Channel Setting Pacing Pulse Width: 0.4 ms
Lead Channel Setting Sensing Sensitivity: 0.6 mV
Pulse Gen Serial Number: 100436

## 2024-05-16 NOTE — Progress Notes (Signed)
 Remote ICD Transmission

## 2024-05-24 ENCOUNTER — Ambulatory Visit: Payer: Self-pay | Admitting: Cardiology

## 2024-05-24 MED ORDER — BISOPROLOL FUMARATE 10 MG PO TABS: 10.0000 mg | ORAL_TABLET | Freq: Every day | ORAL | 3 refills | Status: AC

## 2024-05-29 ENCOUNTER — Telehealth: Payer: Self-pay | Admitting: Cardiology

## 2024-05-29 NOTE — Telephone Encounter (Signed)
 Spoke with patient of Dr. Pietro who has CAD, ischemic cardiomyopathy, LVEF 25-30%.   He reports SOB x1 week. This is worse at night when lying down to sleep. He sleeps upright when he feels unwell, which he has had to do over the last week. He does not weigh daily but can feel he is gaining weight, in abdomen (swelling). No LE edema. He takes lasix  40mg  daily with good UOP. No chest pain. Bisoprolol  recently increased for VT on 05/24/24.  Routed to [facility] MD to review.

## 2024-05-29 NOTE — Telephone Encounter (Signed)
 Pt c/o Shortness Of Breath: STAT if SOB developed within the last 24 hours or pt is noticeably SOB on the phone  1. Are you currently SOB (can you hear that pt is SOB on the phone)? No   2. How long have you been experiencing SOB? About a week   3. Are you SOB when sitting or when up moving around? Both   4. Are you currently experiencing any other symptoms? No   Pt has been having SOB on and off for about a week. He states it is worst when he is up and moving, and when he is trying to sleep. Please advise.

## 2024-05-30 ENCOUNTER — Telehealth: Payer: Self-pay | Admitting: Cardiology

## 2024-05-30 NOTE — Telephone Encounter (Signed)
 Spoke with pt who complains of increasing SOB, fatigue with some abdominal edema.  Pt does not weigh himself daily.  He reports he does check his BP daily and it has been WNL.  Pt reports he is taking medications as prescribed and is still having good response from his Furosemide  40mg  daily. Appointment scheduled with Dr Kriste, DOD on 06/01/2024.  Reviewed ED precautions.  Pt verbalizes understanding and agrees with current plan.

## 2024-05-30 NOTE — Telephone Encounter (Signed)
 Per patients MyChart message asking for an appointment - not feeling well, fatigue, shortness of breath   Pt c/o Shortness Of Breath: STAT if SOB developed within the last 24 hours or pt is noticeably SOB on the phone  1. Are you currently SOB (can you hear that pt is SOB on the phone)?  yes  2. How long have you been experiencing SOB? several weeks  3. Are you SOB when sitting or when up moving around? moving around, walking  4. Are you currently experiencing any other symptoms? tired, legs hurting    Responses were via MyChart.

## 2024-05-31 ENCOUNTER — Other Ambulatory Visit: Payer: Self-pay | Admitting: Cardiology

## 2024-05-31 NOTE — Telephone Encounter (Signed)
 Spoke with pt, Aware of dr vertie recommendations.  He has a follow up appointment tomorrow.

## 2024-06-01 ENCOUNTER — Ambulatory Visit: Attending: Internal Medicine | Admitting: Internal Medicine

## 2024-06-01 ENCOUNTER — Encounter: Payer: Self-pay | Admitting: Internal Medicine

## 2024-06-01 VITALS — BP 98/62 | HR 57 | Ht 66.0 in | Wt 173.6 lb

## 2024-06-01 DIAGNOSIS — I2510 Atherosclerotic heart disease of native coronary artery without angina pectoris: Secondary | ICD-10-CM

## 2024-06-01 DIAGNOSIS — I255 Ischemic cardiomyopathy: Secondary | ICD-10-CM | POA: Diagnosis not present

## 2024-06-01 DIAGNOSIS — Z950 Presence of cardiac pacemaker: Secondary | ICD-10-CM

## 2024-06-01 DIAGNOSIS — Z79899 Other long term (current) drug therapy: Secondary | ICD-10-CM

## 2024-06-01 DIAGNOSIS — N1831 Chronic kidney disease, stage 3a: Secondary | ICD-10-CM | POA: Diagnosis not present

## 2024-06-01 DIAGNOSIS — Z8673 Personal history of transient ischemic attack (TIA), and cerebral infarction without residual deficits: Secondary | ICD-10-CM

## 2024-06-01 DIAGNOSIS — I5023 Acute on chronic systolic (congestive) heart failure: Secondary | ICD-10-CM

## 2024-06-01 NOTE — Progress Notes (Signed)
 Cardiology Office Note   Date:  06/01/2024  ID:  Richard Flynn, DOB 18-Jul-1955, MRN [id number] PCP: Donelda Evalene JINNY DEVONNA  [facility] Providers Cardiologist:  Redell Shallow, MD Cardiology APP:  Madie Jon Garre, [place]  Electrophysiologist:  Soyla Gladis Norton, MD     History of Present Illness Richard Flynn is a 68 y.o. male with a past medical history of CAD (with CTO of the RCA and left-to-right and right to right collaterals, moderate distal LCx disease), ischemic cardiomyopathy, chronic systolic heart failure s/p [employer] single-chamber ICD 10/2012, PAD, hypertension, hyperlipidemia, CVA 06/20/2022 with large vessel occlusion of the left M1 segment treated with IR guided thrombectomy of the left MCA, type 2 diabetes, CKD 3, tobacco use who presents today with worsening shortness of breath x 3 weeks to 1 month  A recent ICD interrogation 05/13/2024 showed episodes of VT and his bisoprolol  was increased to 10 mg daily  Today he says that he has been having abdominal bloating and shortness of breath for about the past month.  He has also been having orthopnea and has been sleeping in his recliner more often due to this.  He has not had lower extremity edema.  This is similar to prior heart failure exacerbations.  He has a high salt diet which includes breakfast sausage or bacon, ham sandwich pork as an example of a typical days meals.  ROS:  Review of Systems  All other systems reviewed and are negative.   Physical Exam  Physical Exam Vitals and nursing note reviewed.  Constitutional:      Appearance: Normal appearance.  HENT:     Head: Normocephalic and atraumatic.  Eyes:     Conjunctiva/sclera: Conjunctivae normal.  Neck:     Comments: Mild JVD with HJR to mid neck Cardiovascular:     Rate and Rhythm: Normal rate and regular rhythm.  Pulmonary:     Effort: Pulmonary effort is normal.     Breath sounds: Normal breath sounds.  Abdominal:     Comments:  Mild abdominal distention, soft to palpation  Musculoskeletal:        General: No swelling or tenderness.  Skin:    Coloration: Skin is not jaundiced or pale.  Neurological:     Mental Status: He is alert.     VS:  BP 98/62   Pulse (!) 57   Ht 5' 6 (1.676 m)   Wt 173 lb 9.6 oz (78.7 kg)   SpO2 97%   BMI 28.02 kg/m         Wt Readings from Last 3 Encounters:  06/01/24 173 lb 9.6 oz (78.7 kg)  12/09/23 166 lb (75.3 kg)  11/08/23 166 lb (75.3 kg)     EKG Interpretation Date/Time:  Thursday June 01 2024 14:35:21 EST Ventricular Rate:  55 PR Interval:  208 QRS Duration:  110 QT Interval:  456 QTC Calculation: 436 R Axis:   28  Text Interpretation: Sinus bradycardia Incomplete left bundle branch block ST & T wave abnormality, consider inferolateral ischemia When compared with ECG of 08-Nov-2023 10:52, Premature ventricular complexes are no longer Present Criteria for Septal infarct are no longer Present Confirmed by Kriste Hicks [phone]) on 06/01/2024 2:52:45 PM    Studies Reviewed   Prior CV Studies: ECHO COMPLETE WITH IMAGING ENHANCING AGENT 06/20/2022  Narrative ECHOCARDIOGRAM REPORT    Patient Name:   Richard Flynn Date of Exam: 06/20/2022 Medical Rec #:  [id number]     Height:  65.0 in Accession #:    [id number]    Weight:       175.9 lb Date of Birth:  [birth year]     BSA:          1.873 m Patient Age:    66 years      BP:           116/64 mmHg Patient Gender: M             HR:           62 bpm. Exam Location:  Inpatient  Procedure: 2D Echo, Cardiac Doppler, Color Doppler and Intracardiac Opacification Agent  Indications:    Stroke  History:        Patient has prior history of Echocardiogram examinations, most recent 06/15/2020. CHF, CAD, Defibrillator, PAD; Risk Factors:Diabetes, Dyslipidemia and Former Smoker. CKD.  Sonographer:    Rome Eans RDCS (AE) Referring Phys: [id number] [facility]  IMPRESSIONS   1. Left ventricular ejection  fraction, by estimation, is 25 to 30%. The left ventricle has severely decreased function. The left ventricle demonstrates global hypokinesis. The left ventricular internal cavity size was moderately dilated. There is mild left ventricular hypertrophy. Left ventricular diastolic parameters are consistent with Grade I diastolic dysfunction (impaired relaxation). 2. Right ventricular systolic function is mildly reduced. The right ventricular size is normal. Tricuspid regurgitation signal is inadequate for assessing PA pressure. 3. Left atrial size was mild to moderately dilated. 4. The mitral valve is normal in structure. Trivial mitral valve regurgitation. No evidence of mitral stenosis. 5. The aortic valve is tricuspid. There is mild calcification of the aortic valve. Aortic valve regurgitation is not visualized. No aortic stenosis is present. 6. The inferior vena cava is normal in size with greater than 50% respiratory variability, suggesting right atrial pressure of 3 mmHg.  Conclusion(s)/Recommendation(s): No intracardiac source of embolism detected on this transthoracic study. Consider a transesophageal echocardiogram to exclude cardiac source of embolism if clinically indicated. No left ventricular mural or apical thrombus/thrombi.  FINDINGS Left Ventricle: Left ventricular ejection fraction, by estimation, is 25 to 30%. The left ventricle has severely decreased function. The left ventricle demonstrates global hypokinesis. Definity  contrast agent was given IV to delineate the left ventricular endocardial borders. 3D left ventricular ejection fraction analysis performed but not reported based on interpreter judgement due to suboptimal tracking. The left ventricular internal cavity size was moderately dilated. There is mild left ventricular hypertrophy. Left ventricular diastolic parameters are consistent with Grade I diastolic dysfunction (impaired relaxation).  Right Ventricle: The right  ventricular size is normal. No increase in right ventricular wall thickness. Right ventricular systolic function is mildly reduced. Tricuspid regurgitation signal is inadequate for assessing PA pressure.  Left Atrium: Left atrial size was mild to moderately dilated.  Right Atrium: Right atrial size was normal in size.  Pericardium: There is no evidence of pericardial effusion.  Mitral Valve: The mitral valve is normal in structure. Trivial mitral valve regurgitation. No evidence of mitral valve stenosis.  Tricuspid Valve: The tricuspid valve is normal in structure. Tricuspid valve regurgitation is trivial. No evidence of tricuspid stenosis.  Aortic Valve: The aortic valve is tricuspid. There is mild calcification of the aortic valve. Aortic valve regurgitation is not visualized. No aortic stenosis is present. Aortic valve mean gradient measures 3.0 mmHg. Aortic valve peak gradient measures 5.5 mmHg. Aortic valve area, by VTI measures 3.35 cm.  Pulmonic Valve: The pulmonic valve was normal in structure. Pulmonic valve regurgitation is not visualized. No  evidence of pulmonic stenosis.  Aorta: The aortic root is normal in size and structure. Ascending aorta measurements are within normal limits for age when indexed to body surface area.  Venous: The inferior vena cava is normal in size with greater than 50% respiratory variability, suggesting right atrial pressure of 3 mmHg.  IAS/Shunts: The atrial septum is grossly normal.  Additional Comments: A device lead is visualized in the right ventricle.   LEFT VENTRICLE PLAX 2D LVIDd:         6.50 cm      Diastology LVIDs:         5.70 cm      LV e' medial:    4.35 cm/s LV PW:         1.30 cm      LV E/e' medial:  19.7 LV IVS:        1.40 cm      LV e' lateral:   4.90 cm/s LVOT diam:     2.20 cm      LV E/e' lateral: 17.5 LV SV:         89 LV SV Index:   48 LVOT Area:     3.80 cm  3D Volume EF: LV Volumes (MOD)            3D EF:         38 % LV vol d, MOD A4C: 262.0 ml LV EDV:       210 ml LV vol s, MOD A4C: 159.0 ml LV ESV:       131 ml LV SV MOD A4C:     262.0 ml LV SV:        79 ml  RIGHT VENTRICLE RV Basal diam:  3.00 cm RV S prime:     14.70 cm/s TAPSE (M-mode): 3.0 cm  LEFT ATRIUM             Index        RIGHT ATRIUM           Index LA diam:        4.90 cm 2.62 cm/m   RA Area:     17.70 cm LA Vol (A2C):   90.2 ml 48.15 ml/m  RA Volume:   48.90 ml  26.11 ml/m LA Vol (A4C):   59.4 ml 31.71 ml/m LA Biplane Vol: 74.5 ml 39.77 ml/m AORTIC VALVE AV Area (Vmax):    3.51 cm AV Area (Vmean):   3.42 cm AV Area (VTI):     3.35 cm AV Vmax:           117.00 cm/s AV Vmean:          77.000 cm/s AV VTI:            0.267 m AV Peak Grad:      5.5 mmHg AV Mean Grad:      3.0 mmHg LVOT Vmax:         108.00 cm/s LVOT Vmean:        69.200 cm/s LVOT VTI:          0.235 m LVOT/AV VTI ratio: 0.88  AORTA Ao Root diam: 3.20 cm Ao Asc diam:  3.80 cm  MITRAL VALVE MV Area (PHT): 2.95 cm     SHUNTS MV Decel Time: 257 msec     Systemic VTI:  0.24 m MV E velocity: 85.90 cm/s   Systemic Diam: 2.20 cm MV A velocity: 127.00 cm/s MV E/A ratio:  0.68  Soyla Merck  MD Electronically signed by Soyla Merck MD Signature Date/Time: 06/20/2022/3:52:41 PM    Final      Risk Assessment/Calculations             ASCVD risk score: The ASCVD Risk score (Arnett DK, et al., 2019) failed to calculate for the following reasons:   Risk score cannot be calculated because patient has a medical history suggesting prior/existing ASCVD   ASSESSMENT  Acute on chronic HFrEF suspect due to dietary indiscretion.  GDMT: Entresto  24-26, Jardiance  25 mg, bisoprolol  10 mg, Lasix  40 mg daily.  BP 98/62 CAD (with CTO of the RCA and left-to-right and right to right collaterals, moderate distal LCx disease) on aspirin , Plavix  atorvastatin  80 mg Ischemic cardiomyopathy s/p [employer] single-chamber ICD with nonsustained VT on  recent interrogation Hypertension Hyperlipidemia CVA with large vessel occlusion of the left M1 segment treated with IR guided thrombectomy of left MCA Type 2 diabetes CKD 3 Tobacco use   Plan  Increase Lasix  to 40 mg twice daily x 3 days and return back to daily dosing Will check an echocardiogram as he has not had one done in 2 years Advised to limit salt intake  Follow up: In 1 to 2 weeks with APP for volume reassessment          Signed, Emeline FORBES Calender, DO

## 2024-06-01 NOTE — Patient Instructions (Signed)
 Medication Instructions:  LASIX , take 40 mg twice a day for 3 days and then go back to taking 40 mg once a day.   *If you need a refill on your cardiac medications before your next appointment, please call your pharmacy*  Testing/Procedures: Your physician has requested that you have an echocardiogram. Echocardiography is a painless test that uses sound waves to create images of your heart. It provides your doctor with information about the size and shape of your heart and how well your heart's chambers and valves are working. This procedure takes approximately one hour. There are no restrictions for this procedure. Please do NOT wear cologne, perfume, aftershave, or lotions (deodorant is allowed). Please arrive 15 minutes prior to your appointment time.  Please note: We ask at that you not bring children with you during ultrasound (echo/ vascular) testing. Due to room size and safety concerns, children are not allowed in the ultrasound rooms during exams. Our front office staff cannot provide observation of children in our lobby area while testing is being conducted. An adult accompanying a patient to their appointment will only be allowed in the ultrasound room at the discretion of the ultrasound technician under special circumstances. We apologize for any inconvenience.   Follow-Up: At [facility], you and your health needs are our priority.  As part of our continuing mission to provide you with exceptional heart care, our providers are all part of one team.  This team includes your primary Cardiologist (physician) and Advanced Practice Providers or APPs (Physician Assistants and Nurse Practitioners) who all work together to provide you with the care you need, when you need it.  Your next appointment:   2 week(s)  Provider:   One of our Advanced Practice Providers (APPs): Morse Clause, PA-C  Lamarr Satterfield, NP Miriam Shams, NP  Olivia Pavy, PA-C Josefa Beauvais, NP  Leontine Salen, PA-C Orren Fabry, PA-C  [place], PA-C Ernest Dick, NP  Damien Braver, NP Jon Hails, PA-C  Waddell Donath, PA-C    Dayna Dunn, PA-C  Scott Weaver, PA-C Lum Louis, NP Katlyn West, NP Callie Goodrich, PA-C  Xika Zhao, NP Sheng Haley, PA-C    Kathleen Johnson, PA-C

## 2024-06-04 ENCOUNTER — Ambulatory Visit

## 2024-06-05 LAB — CUP PACEART REMOTE DEVICE CHECK
Date Time Interrogation Session: 20251207141900
Implantable Pulse Generator Implant Date: 20231228
Pulse Gen Serial Number: 102318

## 2024-06-07 ENCOUNTER — Ambulatory Visit: Payer: Self-pay | Admitting: Cardiology

## 2024-06-13 NOTE — Progress Notes (Signed)
 Remote Loop Recorder Transmission

## 2024-07-05 ENCOUNTER — Ambulatory Visit: Admitting: Emergency Medicine

## 2024-07-05 ENCOUNTER — Ambulatory Visit

## 2024-07-05 DIAGNOSIS — I255 Ischemic cardiomyopathy: Secondary | ICD-10-CM | POA: Diagnosis not present

## 2024-07-06 ENCOUNTER — Ambulatory Visit: Payer: Self-pay | Admitting: Cardiology

## 2024-07-06 LAB — CUP PACEART REMOTE DEVICE CHECK
Date Time Interrogation Session: 20260107002300
Implantable Pulse Generator Implant Date: 20231228
Pulse Gen Serial Number: 102318

## 2024-07-10 ENCOUNTER — Encounter: Payer: Self-pay | Admitting: *Deleted

## 2024-07-10 NOTE — Progress Notes (Signed)
 Remote Loop Recorder Transmission

## 2024-07-11 ENCOUNTER — Ambulatory Visit: Attending: Emergency Medicine | Admitting: Emergency Medicine

## 2024-07-11 ENCOUNTER — Encounter: Payer: Self-pay | Admitting: Emergency Medicine

## 2024-07-11 VITALS — BP 98/60 | HR 58 | Ht 66.0 in | Wt 176.0 lb

## 2024-07-11 DIAGNOSIS — I2510 Atherosclerotic heart disease of native coronary artery without angina pectoris: Secondary | ICD-10-CM | POA: Diagnosis not present

## 2024-07-11 DIAGNOSIS — Z8673 Personal history of transient ischemic attack (TIA), and cerebral infarction without residual deficits: Secondary | ICD-10-CM

## 2024-07-11 DIAGNOSIS — I255 Ischemic cardiomyopathy: Secondary | ICD-10-CM

## 2024-07-11 DIAGNOSIS — E785 Hyperlipidemia, unspecified: Secondary | ICD-10-CM

## 2024-07-11 DIAGNOSIS — I5022 Chronic systolic (congestive) heart failure: Secondary | ICD-10-CM

## 2024-07-11 DIAGNOSIS — I10 Essential (primary) hypertension: Secondary | ICD-10-CM

## 2024-07-11 NOTE — Patient Instructions (Signed)
 Medication Instructions:  NO CHANGES  Lab Work: NONE TO BE DONE TODAY.  Testing/Procedures: NONE  Follow-Up: At [facility], you and your health needs are our priority.  As part of our continuing mission to provide you with exceptional heart care, our providers are all part of one team.  This team includes your primary Cardiologist (physician) and Advanced Practice Providers or APPs (Physician Assistants and Nurse Practitioners) who all work together to provide you with the care you need, when you need it.  Your next appointment:   4 MONTHS  Provider:   KRISTE, MD   Other Instructions:

## 2024-07-11 NOTE — Progress Notes (Signed)
 " Cardiology Office Note:    Date:  07/11/2024  ID:  Richard Flynn, DOB 12-15-1955, MRN [id number] PCP: Donelda Evalene JINNY DEVONNA  [facility] Providers Cardiologist:  Redell Shallow, MD Cardiology APP:  Madie Jon Garre, [place]  Electrophysiologist:  Soyla Gladis Norton, MD       Patient Profile:       Chief Complaint: 1 month follow-up History of Present Illness:  Richard Flynn is a 69 y.o. male with visit-pertinent history of coronary artery disease (a CTO of the RCA and left-to-right and right to right collaterals, moderate distal LCx disease), ischemic cardiomyopathy, chronic systolic heart failure s/p ICD in 2014, peripheral arterial disease, hypertension, hyperlipidemia, CVA in 05/2022 with LVO of the left M1 segment treated with IR guided thrombectomy of the left MCA, T2DM, CKD stage III, tobacco use  CAD. LHC 05/02/2012: CTO RCA with left-to-right and right to right collaterals.  Moderate disease in distal Cx. R/LHC 06/17/2020: CTO proximal RCA with left-to-right collaterals.  OM1 50%.  Mild nonobstructive disease left side.  Appears euvolemic by right heart cath.  No significant change from prior cath 2013.  Medical therapy for CAD. Chronic systolic heart failure/Ischemic cardiomyopathy. Echo October 2013 performed at [facility]: EF 22%.  Dilated left and right ventricles.  2+ MR.  Pleural effusion. Patient reported echo 2017 showed EF 30 to 35%. ICD implantation 11/18/2012: [employer] ICD. Remote device check 08/13/2022: Normal function.  2 short NSVT arrhythmias detected.  Battery lead parameters stable. Echo 06/20/2022: EF 25 to 30%.  Global hypokinesis.  Moderately dilated left ventricular internal cavity.  Mild LVH.  Grade I DD.  Mildly reduced right ventricular systolic function.  Mild to moderately dilated left atrium.  Trivial MR.  Mild calcification of aortic valve.  PAD.  History of aortic iliac occlusion. Hypertension. Hyperlipidemia. Direct  LDL 06/22/2022: 48, TG 134. CVA. CTA head neck 06/20/2022: Emergent large vessel occlusion at the left M1 segment.  There is underfilling of downstream reconstituted vessels.  No proximal flow-limiting stenosis or ulceration in this distribution.  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 IR percutaneous thrombectomy 06/20/2022: Complete revascularization of occluded left middle cerebral artery. Loop recorder implantation 06/25/2022. Remote check 10/13/2022 (not reviewed by MD): No new symptoms, tachycardia, bradycardia, pause episodes.  No new AF episodes.  18 new false AF events, EGMs show SR with frequent ectopy. Change programming from balanced to less LA.  T2DM. A1c 06/20/2022: 7.6 CKD stage IIIa. Tobacco abuse. Anemia.  ICD interrogation on 05/13/2024 showed episodes of VT and his bisoprolol  was increased to 10 mg daily.  Last seen in clinic on 06/01/2024 by Dr. Kriste.  The patient presented with abdominal bloating and shortness of breath for the past month.  Have been having orthopnea and sleeping in the recliner more often due to this.  Lasix  was increased to 40 mg twice daily x 3 days and then returned back to daily dosing.  Echocardiogram was ordered and is currently pending.   Discussed the use of AI scribe software for clinical note transcription with the patient, who gave verbal consent to proceed.  History of Present Illness Richard Flynn is a 69 year old male with chronic systolic heart failure who presents for 1 month follow-up.  Today he tells me he is doing exceptionally well without acute cardiovascular concerns or complaints.  He developed abdominal bloating, shortness of breath, and difficulty lying flat at night, which led him to  sleep in a recliner around 1 month ago.  Reports no further episodes of orthopnea denies any PND.  He has no chest pain or significant swelling.  He states compliant with his  current medication regimen.  He stays active during the day and does frequently play golf without exertional symptoms.  He denies lower extremity edema, syncope, presyncope, headedness, dizziness  Review of systems:  Please see the history of present illness. All other systems are reviewed and otherwise negative.     Studies Reviewed:        Echocardiogram 06/20/2022  1. Left ventricular ejection fraction, by estimation, is 25 to 30%. The  left ventricle has severely decreased function. The left ventricle  demonstrates global hypokinesis. The left ventricular internal cavity size  was moderately dilated. There is mild  left ventricular hypertrophy. Left ventricular diastolic parameters are  consistent with Grade I diastolic dysfunction (impaired relaxation).   2. Right ventricular systolic function is mildly reduced. The right  ventricular size is normal. Tricuspid regurgitation signal is inadequate  for assessing PA pressure.   3. Left atrial size was mild to moderately dilated.   4. The mitral valve is normal in structure. Trivial mitral valve  regurgitation. No evidence of mitral stenosis.   5. The aortic valve is tricuspid. There is mild calcification of the  aortic valve. Aortic valve regurgitation is not visualized. No aortic  stenosis is present.   6. The inferior vena cava is normal in size with greater than 50%  respiratory variability, suggesting right atrial pressure of 3 mmHg.   Cardiac catheterization 06/17/2020 Prox RCA lesion is 100% stenosed. Left to right collaterals. 1st Mrg lesion is 50% stenosed. Calcified lesion. Other mild, nonobstructive disease on the left side. LV end diastolic pressure is normal. There is no aortic valve stenosis. Ao sat 98%, PA sat 58%, PA pressure 27/7, mean PA 14 mm Hg; mean PCWP 11 mm Hg; CO 4.11 L/min; CI 2.2   No significant change from prior cath report.  Medical therapy for CAD and heart failure.    Appears euvolemic by right  heart cath.  Diagnostic Dominance: Right  Risk Assessment/Calculations:              Physical Exam:   VS:  BP 98/60   Pulse (!) 58   Ht 5' 6 (1.676 m)   Wt 176 lb (79.8 kg)   BMI 28.41 kg/m    Wt Readings from Last 3 Encounters:  07/11/24 176 lb (79.8 kg)  06/01/24 173 lb 9.6 oz (78.7 kg)  12/09/23 166 lb (75.3 kg)    GEN: Well nourished, well developed in no acute distress NECK: No JVD; No carotid bruits CARDIAC: RRR, no murmurs, rubs, gallops RESPIRATORY:  Clear to auscultation without rales, wheezing or rhonchi  ABDOMEN: Soft, non-tender, non-distended EXTREMITIES:  No edema; No acute deformity      Assessment and Plan:  Chronic systolic heart failure S/p ICD implantation in 2014 Echocardiogram 05/2022 with LVEF 25 to 30% - Seen 05/2023 with abdominal bloating and shortness of breath and since has had resolution of his symptoms - NYHA class II -Today he appears euvolemic and well compensated on exam he denies dyspnea, orthopnea, PND, lower extremity edema, weight gain - GDMT optimization has been limited due to low blood pressure and CKD - Continue bisoprolol  10 mg daily, Jardiance  25 mg daily, Entresto  24-26 mg twice daily - He is pending echocardiogram on 07/13/2024  Coronary artery disease RCA CTO dating back to 2013 Most  recent cath 05/2020 showed stable disease with proximal RCA lesion 100% stenosed with left-to-right collaterals and first marginal lesion 50% stenosed, otherwise mild nonobstructive disease.  Medical therapy was recommended - Remains on DAPT for CAD and CVA - Continue aspirin  81 mg daily, atorvastatin  80 mg daily, bisoprolol  10 mg daily, clopidogrel  75 mg daily  Hyperlipidemia LDL 58 on 06/2023 and well-controlled - Continue atorvastatin  80 mg daily  Hypertension Blood pressure borderline low today at 98/60 - He remains asymptomatic without lightheadedness, dizziness, syncope, presyncope - Continue HF GDMT  CVA with ILR in place Underwent  thrombectomy December 2023 with complete revascularization of occluded left middle cerebral artery S/p loop recorder insertion on 08/13/2022 - Managed on DAPT with aspirin  and Plavix  as well as atorvastatin  - Today he is without new neurological symptoms      Dispo:  Return in about 4 months (around 11/08/2024).  Signed, Lum LITTIE Louis, NP  "

## 2024-07-13 ENCOUNTER — Ambulatory Visit: Payer: Self-pay | Admitting: Internal Medicine

## 2024-07-13 ENCOUNTER — Ambulatory Visit (HOSPITAL_COMMUNITY)
Admission: RE | Admit: 2024-07-13 | Discharge: 2024-07-13 | Disposition: A | Discharge status: Home / Self Care | Source: Ambulatory Visit | Attending: Internal Medicine | Admitting: Internal Medicine

## 2024-07-13 DIAGNOSIS — I255 Ischemic cardiomyopathy: Secondary | ICD-10-CM | POA: Insufficient documentation

## 2024-07-13 LAB — ECHOCARDIOGRAM COMPLETE
Area-P 1/2: 3.6 cm2
S' Lateral: 5.6 cm

## 2024-07-13 MED ORDER — PERFLUTREN LIPID MICROSPHERE
1.0000 mL | INTRAVENOUS | Status: AC | PRN
  Administered 2024-07-13: 1 mL via INTRAVENOUS

## 2024-08-05 ENCOUNTER — Encounter

## 2024-08-10 ENCOUNTER — Ambulatory Visit

## 2024-09-05 ENCOUNTER — Encounter

## 2024-10-06 ENCOUNTER — Encounter

## 2024-11-06 ENCOUNTER — Encounter

## 2024-11-09 ENCOUNTER — Ambulatory Visit: Admitting: Internal Medicine

## 2024-11-09 ENCOUNTER — Ambulatory Visit

## 2024-12-07 ENCOUNTER — Encounter

## 2025-01-07 ENCOUNTER — Encounter

## 2025-02-07 ENCOUNTER — Encounter

## 2025-02-08 ENCOUNTER — Ambulatory Visit

## 2025-03-10 ENCOUNTER — Encounter

## 2025-04-10 ENCOUNTER — Encounter

## 2025-05-10 ENCOUNTER — Ambulatory Visit

## 2025-05-11 ENCOUNTER — Encounter

## 2025-06-11 ENCOUNTER — Encounter

## 2025-08-09 ENCOUNTER — Ambulatory Visit
